# Patient Record
Sex: Female | Born: 1975 | Race: Black or African American | Hispanic: No | Marital: Single | Smoking: Never smoker
Health system: Southern US, Community
[De-identification: ages and names within clinical notes are randomized; demographics above are authoritative.]

## PROBLEM LIST (undated history)

## (undated) DIAGNOSIS — B009 Herpesviral infection, unspecified: Secondary | ICD-10-CM

## (undated) DIAGNOSIS — F419 Anxiety disorder, unspecified: Secondary | ICD-10-CM

## (undated) DIAGNOSIS — A4902 Methicillin resistant Staphylococcus aureus infection, unspecified site: Secondary | ICD-10-CM

## (undated) DIAGNOSIS — I10 Essential (primary) hypertension: Secondary | ICD-10-CM

## (undated) DIAGNOSIS — K219 Gastro-esophageal reflux disease without esophagitis: Secondary | ICD-10-CM

## (undated) DIAGNOSIS — N760 Acute vaginitis: Secondary | ICD-10-CM

## (undated) DIAGNOSIS — Z9889 Other specified postprocedural states: Secondary | ICD-10-CM

## (undated) DIAGNOSIS — T7840XA Allergy, unspecified, initial encounter: Secondary | ICD-10-CM

## (undated) DIAGNOSIS — E785 Hyperlipidemia, unspecified: Secondary | ICD-10-CM

## (undated) DIAGNOSIS — M1990 Unspecified osteoarthritis, unspecified site: Secondary | ICD-10-CM

## (undated) DIAGNOSIS — IMO0002 Reserved for concepts with insufficient information to code with codable children: Secondary | ICD-10-CM

## (undated) HISTORY — DX: Unspecified osteoarthritis, unspecified site: M19.90

## (undated) HISTORY — DX: Gastro-esophageal reflux disease without esophagitis: K21.9

## (undated) HISTORY — DX: Hyperlipidemia, unspecified: E78.5

## (undated) HISTORY — DX: Essential (primary) hypertension: I10

## (undated) HISTORY — DX: Reserved for concepts with insufficient information to code with codable children: IMO0002

## (undated) HISTORY — DX: Acute vaginitis: N76.0

## (undated) HISTORY — DX: Methicillin resistant Staphylococcus aureus infection, unspecified site: A49.02

## (undated) HISTORY — DX: Anxiety disorder, unspecified: F41.9

## (undated) HISTORY — DX: Allergy, unspecified, initial encounter: T78.40XA

## (undated) HISTORY — DX: Other specified postprocedural states: Z98.890

---

## 2000-05-29 ENCOUNTER — Other Ambulatory Visit: Admission: RE | Admit: 2000-05-29 | Discharge: 2000-05-29 | Payer: Self-pay

## 2001-02-05 ENCOUNTER — Emergency Department (HOSPITAL_COMMUNITY): Admission: EM | Admit: 2001-02-05 | Discharge: 2001-02-05 | Payer: Self-pay | Admitting: *Deleted

## 2001-02-07 ENCOUNTER — Emergency Department (HOSPITAL_COMMUNITY): Admission: EM | Admit: 2001-02-07 | Discharge: 2001-02-07 | Payer: Self-pay | Admitting: *Deleted

## 2001-02-13 ENCOUNTER — Emergency Department (HOSPITAL_COMMUNITY): Admission: EM | Admit: 2001-02-13 | Discharge: 2001-02-13 | Payer: Self-pay | Admitting: Emergency Medicine

## 2001-07-23 ENCOUNTER — Other Ambulatory Visit: Admission: RE | Admit: 2001-07-23 | Discharge: 2001-07-23 | Payer: Self-pay

## 2002-05-21 ENCOUNTER — Ambulatory Visit (HOSPITAL_COMMUNITY): Admission: AD | Admit: 2002-05-21 | Discharge: 2002-05-21 | Payer: Self-pay | Admitting: Obstetrics and Gynecology

## 2004-09-16 ENCOUNTER — Ambulatory Visit: Payer: Self-pay | Admitting: Family Medicine

## 2004-09-22 ENCOUNTER — Ambulatory Visit (HOSPITAL_COMMUNITY): Admission: RE | Admit: 2004-09-22 | Discharge: 2004-09-22 | Payer: Self-pay | Admitting: Family Medicine

## 2004-09-22 ENCOUNTER — Ambulatory Visit: Payer: Self-pay | Admitting: Cardiology

## 2004-10-07 ENCOUNTER — Ambulatory Visit: Payer: Self-pay | Admitting: Family Medicine

## 2005-01-06 ENCOUNTER — Ambulatory Visit: Payer: Self-pay | Admitting: Family Medicine

## 2005-03-24 ENCOUNTER — Other Ambulatory Visit: Admission: RE | Admit: 2005-03-24 | Discharge: 2005-03-24 | Payer: Self-pay | Admitting: Family Medicine

## 2005-03-24 ENCOUNTER — Ambulatory Visit: Payer: Self-pay | Admitting: Family Medicine

## 2005-05-16 ENCOUNTER — Ambulatory Visit: Payer: Self-pay | Admitting: Family Medicine

## 2005-05-22 ENCOUNTER — Encounter (HOSPITAL_COMMUNITY): Admission: RE | Admit: 2005-05-22 | Discharge: 2005-06-21 | Payer: Self-pay | Admitting: Family Medicine

## 2005-09-01 ENCOUNTER — Ambulatory Visit: Payer: Self-pay | Admitting: Family Medicine

## 2005-11-28 ENCOUNTER — Ambulatory Visit: Payer: Self-pay | Admitting: Family Medicine

## 2006-02-06 ENCOUNTER — Ambulatory Visit: Payer: Self-pay | Admitting: Family Medicine

## 2006-02-08 ENCOUNTER — Encounter: Payer: Self-pay | Admitting: Family Medicine

## 2006-02-12 ENCOUNTER — Ambulatory Visit (HOSPITAL_COMMUNITY): Admission: RE | Admit: 2006-02-12 | Discharge: 2006-02-12 | Payer: Self-pay | Admitting: Family Medicine

## 2006-04-10 ENCOUNTER — Other Ambulatory Visit: Admission: RE | Admit: 2006-04-10 | Discharge: 2006-04-10 | Payer: Self-pay | Admitting: Family Medicine

## 2006-04-10 ENCOUNTER — Encounter (INDEPENDENT_AMBULATORY_CARE_PROVIDER_SITE_OTHER): Payer: Self-pay | Admitting: Specialist

## 2006-04-10 ENCOUNTER — Ambulatory Visit: Payer: Self-pay | Admitting: Family Medicine

## 2006-04-10 LAB — CONVERTED CEMR LAB: Pap Smear: NORMAL

## 2006-04-11 ENCOUNTER — Encounter: Payer: Self-pay | Admitting: Family Medicine

## 2006-04-11 LAB — CONVERTED CEMR LAB: Candida species: NEGATIVE

## 2006-07-10 ENCOUNTER — Ambulatory Visit: Payer: Self-pay | Admitting: Family Medicine

## 2006-07-11 ENCOUNTER — Encounter: Payer: Self-pay | Admitting: Family Medicine

## 2006-07-11 LAB — CONVERTED CEMR LAB
Candida species: NEGATIVE
GC Probe Amp, Genital: NEGATIVE
Gardnerella vaginalis: NEGATIVE

## 2006-12-19 ENCOUNTER — Encounter: Payer: Self-pay | Admitting: Family Medicine

## 2007-05-20 ENCOUNTER — Encounter: Payer: Self-pay | Admitting: Family Medicine

## 2007-05-20 ENCOUNTER — Ambulatory Visit: Payer: Self-pay | Admitting: Family Medicine

## 2007-05-20 ENCOUNTER — Other Ambulatory Visit: Admission: RE | Admit: 2007-05-20 | Discharge: 2007-05-20 | Payer: Self-pay | Admitting: Family Medicine

## 2007-05-21 ENCOUNTER — Encounter: Payer: Self-pay | Admitting: Family Medicine

## 2007-05-21 LAB — CONVERTED CEMR LAB
GC Probe Amp, Genital: POSITIVE — AB
Trichomonal Vaginitis: NEGATIVE

## 2007-05-22 ENCOUNTER — Ambulatory Visit: Payer: Self-pay | Admitting: Family Medicine

## 2007-05-22 DIAGNOSIS — K649 Unspecified hemorrhoids: Secondary | ICD-10-CM | POA: Insufficient documentation

## 2007-05-22 LAB — CONVERTED CEMR LAB
Eosinophils Absolute: 0.1 10*3/uL (ref 0.0–0.7)
HCT: 40.1 % (ref 36.0–46.0)
MCHC: 31.7 g/dL (ref 30.0–36.0)
MCV: 87 fL (ref 78.0–100.0)
Monocytes Absolute: 0.5 10*3/uL (ref 0.1–1.0)
Monocytes Relative: 6 % (ref 3–12)
Neutrophils Relative %: 73 % (ref 43–77)
Platelets: 313 10*3/uL (ref 150–400)
RBC: 4.61 M/uL (ref 3.87–5.11)
RDW: 13.2 % (ref 11.5–15.5)
Sodium: 140 meq/L (ref 135–145)
WBC: 9.4 10*3/uL (ref 4.0–10.5)

## 2007-05-27 ENCOUNTER — Ambulatory Visit (HOSPITAL_COMMUNITY): Admission: RE | Admit: 2007-05-27 | Discharge: 2007-05-27 | Payer: Self-pay | Admitting: Family Medicine

## 2008-01-10 DIAGNOSIS — A4902 Methicillin resistant Staphylococcus aureus infection, unspecified site: Secondary | ICD-10-CM

## 2008-01-10 HISTORY — DX: Methicillin resistant Staphylococcus aureus infection, unspecified site: A49.02

## 2008-01-27 ENCOUNTER — Ambulatory Visit: Payer: Self-pay | Admitting: Family Medicine

## 2008-01-28 ENCOUNTER — Encounter: Payer: Self-pay | Admitting: Family Medicine

## 2008-01-28 LAB — CONVERTED CEMR LAB: GC Probe Amp, Genital: NEGATIVE

## 2008-01-29 ENCOUNTER — Telehealth: Payer: Self-pay | Admitting: Family Medicine

## 2008-01-29 LAB — CONVERTED CEMR LAB
Gardnerella vaginalis: POSITIVE — AB
Trichomonal Vaginitis: NEGATIVE

## 2008-02-26 ENCOUNTER — Telehealth: Payer: Self-pay | Admitting: Family Medicine

## 2008-03-03 ENCOUNTER — Ambulatory Visit: Payer: Self-pay | Admitting: Family Medicine

## 2008-03-03 LAB — CONVERTED CEMR LAB: Beta hcg, urine, semiquantitative: NEGATIVE

## 2008-03-04 ENCOUNTER — Encounter: Payer: Self-pay | Admitting: Family Medicine

## 2008-03-04 LAB — CONVERTED CEMR LAB: Chlamydia, DNA Probe: NEGATIVE

## 2008-03-05 LAB — CONVERTED CEMR LAB
Candida species: NEGATIVE
Gardnerella vaginalis: POSITIVE — AB
Trichomonal Vaginitis: NEGATIVE

## 2008-05-14 ENCOUNTER — Ambulatory Visit: Payer: Self-pay | Admitting: Family Medicine

## 2008-05-15 ENCOUNTER — Encounter: Payer: Self-pay | Admitting: Family Medicine

## 2008-05-15 LAB — CONVERTED CEMR LAB
Candida species: NEGATIVE
Chlamydia, DNA Probe: NEGATIVE
GC Probe Amp, Genital: NEGATIVE
Gardnerella vaginalis: POSITIVE — AB
Trichomonal Vaginitis: NEGATIVE

## 2008-05-18 ENCOUNTER — Other Ambulatory Visit: Admission: RE | Admit: 2008-05-18 | Discharge: 2008-05-18 | Payer: Self-pay | Admitting: Family Medicine

## 2008-05-18 ENCOUNTER — Encounter: Payer: Self-pay | Admitting: Family Medicine

## 2008-05-18 ENCOUNTER — Ambulatory Visit: Payer: Self-pay | Admitting: Family Medicine

## 2008-05-19 LAB — CONVERTED CEMR LAB
Basophils Relative: 0 % (ref 0–1)
Chloride: 107 meq/L (ref 96–112)
Creatinine, Ser: 0.98 mg/dL (ref 0.40–1.20)
HCT: 40.3 % (ref 36.0–46.0)
HDL: 44 mg/dL (ref >39)
Lymphs Abs: 1.9 10*3/uL (ref 0.7–4.0)
Monocytes Absolute: 0.3 10*3/uL (ref 0.1–1.0)
Monocytes Relative: 4 % (ref 3–12)
Neutrophils Relative %: 68 % (ref 43–77)
Platelets: 304 10*3/uL (ref 150–400)
Potassium: 4.2 meq/L (ref 3.5–5.3)
RBC: 4.7 M/uL (ref 3.87–5.11)
RDW: 12.9 % (ref 11.5–15.5)
Sodium: 140 meq/L (ref 135–145)
Total CHOL/HDL Ratio: 4.3
Triglycerides: 92 mg/dL (ref <150)

## 2008-06-02 ENCOUNTER — Telehealth: Payer: Self-pay | Admitting: Family Medicine

## 2008-06-03 ENCOUNTER — Telehealth: Payer: Self-pay | Admitting: Family Medicine

## 2008-06-04 ENCOUNTER — Ambulatory Visit: Payer: Self-pay | Admitting: Family Medicine

## 2008-06-05 ENCOUNTER — Encounter: Payer: Self-pay | Admitting: Family Medicine

## 2008-06-05 ENCOUNTER — Telehealth: Payer: Self-pay | Admitting: Family Medicine

## 2008-08-24 ENCOUNTER — Telehealth: Payer: Self-pay | Admitting: Family Medicine

## 2008-09-23 ENCOUNTER — Ambulatory Visit: Payer: Self-pay | Admitting: Family Medicine

## 2008-09-23 DIAGNOSIS — I10 Essential (primary) hypertension: Secondary | ICD-10-CM | POA: Insufficient documentation

## 2008-09-25 ENCOUNTER — Telehealth: Payer: Self-pay | Admitting: Family Medicine

## 2008-11-23 ENCOUNTER — Ambulatory Visit: Payer: Self-pay | Admitting: Family Medicine

## 2008-11-23 DIAGNOSIS — E669 Obesity, unspecified: Secondary | ICD-10-CM | POA: Insufficient documentation

## 2008-11-23 DIAGNOSIS — E663 Overweight: Secondary | ICD-10-CM | POA: Insufficient documentation

## 2009-03-15 ENCOUNTER — Ambulatory Visit: Payer: Self-pay | Admitting: Physician Assistant

## 2009-03-18 ENCOUNTER — Ambulatory Visit: Payer: Self-pay | Admitting: Family Medicine

## 2009-06-03 ENCOUNTER — Other Ambulatory Visit: Admission: RE | Admit: 2009-06-03 | Discharge: 2009-06-03 | Payer: Self-pay | Admitting: Family Medicine

## 2009-06-03 ENCOUNTER — Ambulatory Visit: Payer: Self-pay | Admitting: Family Medicine

## 2009-06-03 DIAGNOSIS — K3189 Other diseases of stomach and duodenum: Secondary | ICD-10-CM

## 2009-06-03 DIAGNOSIS — F411 Generalized anxiety disorder: Secondary | ICD-10-CM

## 2009-06-03 DIAGNOSIS — R1013 Epigastric pain: Secondary | ICD-10-CM

## 2009-06-04 ENCOUNTER — Encounter: Payer: Self-pay | Admitting: Family Medicine

## 2009-06-04 LAB — CONVERTED CEMR LAB: Gardnerella vaginalis: NEGATIVE

## 2009-06-14 ENCOUNTER — Encounter: Payer: Self-pay | Admitting: Family Medicine

## 2009-06-17 LAB — CONVERTED CEMR LAB
BUN: 10 mg/dL (ref 6–23)
CO2: 21 meq/L (ref 19–32)
Chloride: 104 meq/L (ref 96–112)
Eosinophils Relative: 1 % (ref 0–5)
HDL: 43 mg/dL (ref >39)
Lymphs Abs: 2.4 10*3/uL (ref 0.7–4.0)
Monocytes Absolute: 0.2 10*3/uL (ref 0.1–1.0)
Monocytes Relative: 3 % (ref 3–12)
Neutro Abs: 5.3 10*3/uL (ref 1.7–7.7)
Sodium: 139 meq/L (ref 135–145)
TSH: 1.617 microintl units/mL (ref 0.350–4.500)
Total CHOL/HDL Ratio: 4.8
Triglycerides: 113 mg/dL (ref <150)
VLDL: 23 mg/dL (ref 0–40)
Vit D, 25-Hydroxy: 40 ng/mL (ref 30–89)
WBC: 8.1 10*3/uL (ref 4.0–10.5)

## 2009-07-09 ENCOUNTER — Ambulatory Visit: Payer: Self-pay | Admitting: Family Medicine

## 2009-07-09 DIAGNOSIS — E785 Hyperlipidemia, unspecified: Secondary | ICD-10-CM | POA: Insufficient documentation

## 2010-02-03 ENCOUNTER — Ambulatory Visit
Admission: RE | Admit: 2010-02-03 | Discharge: 2010-02-03 | Payer: Self-pay | Source: Home / Self Care | Attending: Family Medicine | Admitting: Family Medicine

## 2010-02-03 ENCOUNTER — Encounter: Payer: Self-pay | Admitting: Family Medicine

## 2010-02-03 DIAGNOSIS — G4700 Insomnia, unspecified: Secondary | ICD-10-CM | POA: Insufficient documentation

## 2010-02-06 LAB — CONVERTED CEMR LAB: GC Probe Amp, Genital: NEGATIVE

## 2010-02-08 NOTE — Assessment & Plan Note (Signed)
Summary: BOIL -room 2   Vital Signs:  Patient profile:   35 year old female Menstrual status:  on seasonale Height:      63 inches Weight:      157 pounds BMI:     27.91 O2 Sat:      100 % on Room air Pulse rate:   82 / minute Resp:     16 per minute BP sitting:   180 / 110  (left arm)  Vitals Entered By: Adella Hare LPN (March 15, 1608 10:44 AM)  Serial Vital Signs/Assessments:  Time      Position  BP       Pulse  Resp  Temp     By                     164/110                        Esperanza Sheets PA  CC: boil left upper thigh Is Patient Diabetic? No Pain Assessment Patient in pain? no        CC:  boil left upper thigh.  History of Present Illness: Pt c/o boil on the back of her Lt thigh, near her buttock.  This started 3 days ago.  Is uncertain if has gotten larger.  Is painful.  Noticed today some blood & drainage on her bandage when she changed it.  She had 2 boils last yr in the groin area.  She as seen at urgent care then.  States cx did show MRSA.  She denies fever or chills  Pt also has a hx of htn.  She didn't take her meds for a few mos.  Recently restarted  but hasn't taken this am.  Denies chest pain or palp.   Current Medications (verified): 1)  Seasonale 0.15-0.03 Mg  Tabs (Levonorgest-Eth Estrad 91-Day) .... One Tab By Mouth Once Daily 2)  Fluconazole 100 Mg Tabs (Fluconazole) .... Take 1 Tablet By Mouth Once A Day As Needed 3)  Maxzide-25 37.5-25 Mg Tabs (Triamterene-Hctz) .... Take 1 Tablet By Mouth Once A Day  Allergies (verified): No Known Drug Allergies  Past History:  Past medical history reviewed for relevance to current acute and chronic problems.  Past Medical History: HEMORRHOIDS (ICD-455.6) DYSPAREUNIA (ICD-625.0) VAGINITIS (ICD-616.10)  MRSA  Review of Systems General:  Denies chills and fever. CV:  Denies chest pain or discomfort and palpitations. Resp:  Denies shortness of breath. Heme:  Denies enlarge lymph  nodes.  Physical Exam  General:  Well-developed,well-nourished,in no acute distress; alert,appropriate and cooperative throughout examination Head:  Normocephalic and atraumatic without obvious abnormalities. No apparent alopecia or balding. Lungs:  Normal respiratory effort, chest expands symmetrically. Lungs are clear to auscultation, no crackles or wheezes. Heart:  Normal rate and regular rhythm. S1 and S2 normal without gallop, murmur, click, rub or other extra sounds. Skin:  Lt posterior thigh approx 2.5 cm area of erythema & induration.  Small punctate opening noted, & able to express some pus.  Psych:  Cognition and judgment appear intact. Alert and cooperative with normal attention span and concentration. No apparent delusions, illusions, hallucinations   Impression & Recommendations:  Problem # 1:  ABSCESS (ICD-682.9) Assessment New  Hx of MRSA. (unable to cx today) Discussed MRSA with pt.  Prevention for others, seek treatment at early signs of "boil", etc.  Her updated medication list for this problem includes:    Bactrim Ds 800-160 Mg  Tabs (Sulfamethoxazole-trimethoprim) .Marland Kitchen... 1 bid  Problem # 2:  HYPERTENSION (ICD-401.9) Assessment: Deteriorated  Encourage better med compliance.   Her updated medication list for this problem includes:    Maxzide-25 37.5-25 Mg Tabs (Triamterene-hctz) .Marland Kitchen... Take 1 tablet by mouth once a day  BP today: 180/110 Prior BP: 134/84 (11/23/2008)  Prior 10 Yr Risk Heart Disease: 1 % (11/23/2008)  Labs Reviewed: K+: 4.2 (05/18/2008) Creat: : 0.98 (05/18/2008)   Chol: 187 (05/18/2008)   HDL: 44 (05/18/2008)   LDL: 125 (05/18/2008)   TG: 92 (05/18/2008)  Complete Medication List: 1)  Seasonale 0.15-0.03 Mg Tabs (Levonorgest-eth estrad 91-day) .... One tab by mouth once daily 2)  Fluconazole 100 Mg Tabs (Fluconazole) .... Take 1 tablet by mouth once a day as needed 3)  Maxzide-25 37.5-25 Mg Tabs (Triamterene-hctz) .... Take 1 tablet by mouth  once a day 4)  Bactrim Ds 800-160 Mg Tabs (Sulfamethoxazole-trimethoprim) .Marland Kitchen.. 1 bid  Patient Instructions: 1)  Follow up appt in 2-3 days.  sooner if worsens 2)  I prescribed antibiotics for you. Take as directed. 3)  Take your BP medicine regularly. 4)  Apply heat to the area 3-4 times a day. 5)  Take 650-1000mg  of Tylenol every 4-6 hours as needed for relief of pain or comfort of fever AVOID taking more than 4000mg   in a 24 hour period (can cause liver damage in higher doses). 6)  Take 400-600mg  of Ibuprofen (Advil, Motrin) with food every 4-6 hours as needed for relief of pain or comfort of fever. Prescriptions: BACTRIM DS 800-160 MG TABS (SULFAMETHOXAZOLE-TRIMETHOPRIM) 1 bid  #20 x 0   Entered and Authorized by:   Esperanza Sheets PA   Signed by:   Esperanza Sheets PA on 03/15/2009   Method used:   Electronically to        [facility] [address] [phone]* (retail)       [address] [place]  [address]       Ph: [phone]       Fax: [phone]   RxID:   [id number]

## 2010-02-08 NOTE — Assessment & Plan Note (Signed)
Summary: PHY   Vital Signs:  Patient profile:   35 year old female Menstrual status:  on seasonale Height:      63 inches Weight:      155.25 pounds BMI:     27.60 O2 Sat:      98 % Pulse rate:   87 / minute Pulse rhythm:   regular Resp:     16 per minute BP sitting:   128 / 86  (left arm) Cuff size:   regular  Vitals Entered By: Everitt Amber LPN (Jun 03, 2009 8:20 AM) CC: CPE, wants to get something for anxiety too.   Vision Screening:Left eye w/o correction: 20 / 25 Right Eye w/o correction: 20 / 25 Both eyes w/o correction:  20/ 20  Color vision testing: normal      Vision Entered By: Everitt Amber LPN (Jun 03, 2009 8:20 AM)   CC:  CPE and wants to get something for anxiety too. Marland Kitchen  History of Present Illness: Reports  that she has generally been doing She does have concerns about increased anxiety over thwe past 2 to 3 months with chest tightness and difficulty breathing. She has no new stresses.well. Denies recent fever or chills. Denies sinus pressure, nasal congestion , ear pain or sore throat. Denies chest congestion, or cough productive of sputum. Denies chest pain, palpitations, PND, orthopnea or leg swelling. Denies abdominal pain, nausea, vomitting, diarrhea or constipation. Denies change in bowel movements or bloody stool. Denies dysuria , frequency, incontinence or hesitancy. Denies  joint pain, swelling, or reduced mobility. Denies headaches, vertigo, seizures. Denies depression, or insomnia. Denies  rash, lesions, or itch.     Current Medications (verified): 1)  Seasonale 0.15-0.03 Mg  Tabs (Levonorgest-Eth Estrad 91-Day) .... One Tab By Mouth Once Daily 2)  Maxzide-25 37.5-25 Mg Tabs (Triamterene-Hctz) .... Take 1 Tablet By Mouth Once A Day  Allergies (verified): No Known Drug Allergies  Review of Systems      See HPI General:  Denies chills, fatigue, and fever. Eyes:  Denies blurring and discharge. ENT:  Denies earache, hoarseness, sinus  pressure, and sore throat. CV:  Complains of chest pain or discomfort; 1 2 months h/ointermittent chest pain primarily with anxiety, subasternal, no associated nause , diaphoresis or lightheadedness.Non radiating . Resp:  Denies cough and sputum productive. GI:  Denies abdominal pain, constipation, diarrhea, nausea, and vomiting. GU:  Denies dysuria and urinary frequency. MS:  Denies joint pain, low back pain, mid back pain, and thoracic pain. Derm:  Denies itching, lesion(s), and rash. Neuro:  Denies headaches, poor balance, seizures, and sensation of room spinning. Psych:  Complains of anxiety and irritability; denies depression, easily angered, easily tearful, suicidal thoughts/plans, thoughts of violence, and unusual visions or sounds; 2 mnth h/o increased irritability. Endo:  Denies cold intolerance, excessive hunger, excessive thirst, excessive urination, heat intolerance, polyuria, and weight change. Heme:  Denies abnormal bruising and bleeding. Allergy:  Denies hives or rash and itching eyes.  Physical Exam  General:  Well-developed,well-nourished,in no acute distress; alert,appropriate and cooperative throughout examination Head:  Normocephalic and atraumatic without obvious abnormalities. No apparent alopecia or balding. Eyes:  No corneal or conjunctival inflammation noted. EOMI. Perrla. Funduscopic exam benign, without hemorrhages, exudates or papilledema. Vision grossly normal. Ears:  External ear exam shows no significant lesions or deformities.  Otoscopic examination reveals clear canals, tympanic membranes are intact bilaterally without bulging, retraction, inflammation or discharge. Hearing is grossly normal bilaterally. Nose:  External nasal examination shows no deformity or  inflammation. Nasal mucosa are pink and moist without lesions or exudates. Mouth:  Oral mucosa and oropharynx without lesions or exudates.  Teeth in good repair. Neck:  No deformities, masses, or tenderness  noted. Chest Wall:  No deformities, masses, or tenderness noted. Breasts:  No mass, nodules, thickening, tenderness, bulging, retraction, inflamation, nipple discharge or skin changes noted.   Lungs:  Normal respiratory effort, chest expands symmetrically. Lungs are clear to auscultation, no crackles or wheezes. Heart:  Normal rate and regular rhythm. S1 and S2 normal without gallop, murmur, click, rub or other extra sounds. Abdomen:  Bowel sounds positive,abdomen soft and non-tender without masses, organomegaly or hernias noted. Genitalia:  Normal introitus for age, no external lesions, no vaginal discharge, mucosa pink and moist, no vaginal or cervical lesions, no vaginal atrophy, no friaility or hemorrhage, normal uterus size and position, no adnexal masses or tenderness Msk:  No deformity or scoliosis noted of thoracic or lumbar spine.   Pulses:  R and L carotid,radial,femoral,dorsalis pedis and posterior tibial pulses are full and equal bilaterally Extremities:  No clubbing, cyanosis, edema, or deformity noted with normal full range of motion of all joints.   Neurologic:  No cranial nerve deficits noted. Station and gait are normal. Plantar reflexes are down-going bilaterally. DTRs are symmetrical throughout. Sensory, motor and coordinative functions appear intact. Skin:  Intact without suspicious lesions or rashes Cervical Nodes:  No lymphadenopathy noted Axillary Nodes:  No palpable lymphadenopathy Inguinal Nodes:  No significant adenopathy Psych:  Cognition and judgment appear intact. Alert and cooperative with normal attention span and concentration. No apparent delusions, illusions, hallucinations   Impression & Recommendations:  Problem # 1:  CHEST PAIN UNSPECIFIED (ICD-786.50) Assessment Comment Only  Orders: EKG w/ Interpretation (93000)nSR , no ischemia  Problem # 2:  HYPERTENSION (ICD-401.9) Assessment: Unchanged  Her updated medication list for this problem includes:     Maxzide-25 37.5-25 Mg Tabs (Triamterene-hctz) .Marland Kitchen... Take 1 tablet by mouth once a day  Orders: T-Basic Metabolic Panel [phone])  BP today: 128/86 Prior BP: 130/80 (03/18/2009)  Prior 10 Yr Risk Heart Disease: 1 % (11/23/2008)  Labs Reviewed: K+: 4.2 (05/18/2008) Creat: : 0.98 (05/18/2008)   Chol: 187 (05/18/2008)   HDL: 44 (05/18/2008)   LDL: 125 (05/18/2008)   TG: 92 (05/18/2008)  Problem # 3:  ANXIETY STATE, UNSPECIFIED (ICD-300.00) Assessment: Deteriorated  Her updated medication list for this problem includes:    Paroxetine Hcl 10 Mg Tabs (Paroxetine hcl) .Marland Kitchen... Take 1 tablet by mouth once a day  Problem # 4:  OVERWEIGHT (ICD-278.02) Assessment: Unchanged  Ht: 63 (06/03/2009)   Wt: 155.25 (06/03/2009)   BMI: 27.60 (06/03/2009)regular exercise and reduction in caloricintake discussed and encouraged  Complete Medication List: 1)  Seasonale 0.15-0.03 Mg Tabs (Levonorgest-eth estrad 91-day) .... One tab by mouth once daily 2)  Maxzide-25 37.5-25 Mg Tabs (Triamterene-hctz) .... Take 1 tablet by mouth once a day 3)  Paroxetine Hcl 10 Mg Tabs (Paroxetine hcl) .... Take 1 tablet by mouth once a day  Other Orders: T-Lipid Profile (08657-84696) T-TSH [id number]) T-CBC w/Diff (40102-72536) TLB-H. Pylori Abs(Helicobacter Pylori) (86677-HELICO) T-Vitamin D (25-Hydroxy) (64403-47425) T-Wet Prep by Molecular Probe [phone]) T-Chlamydia & GC Probe, Genital ([id number]) Pap Smear (32951)  Patient Instructions: 1)  F/U in 6 weeks. 2)  It is important that you exercise regularly at least 20 minutes 5 times a week. If you develop chest pain, have severe difficulty breathing, or feel very tired , stop exercising immediately and seek medical attention. 3)  You need to lose weight. Consider a lower calorie diet and regular exercise.  4)  BMP prior to visit, ICD-9: 5)  Lipid Panel prior to visit, ICD-9:  fastinf asap 6)  TSH prior to visit, ICD-9: 7)  CBC w/ Diff prior to  visit, ICD-9: 8)  H pylori and Vit D 9)  I believe that your chest pain is from anxiety attacks, we will get aN EKG to ensure your heart is  not in trouble. and i am sending in med for anxiety  Prescriptions: PAROXETINE HCL 10 MG TABS (PAROXETINE HCL) Take 1 tablet by mouth once a day  #30 x 3   Entered and Authorized by:   Syliva Overman MD   Signed by:   Syliva Overman MD on 06/03/2009   Method used:   Electronically to        [facility]  [address] (retail)       [address]       [place], [place]  [address]       Ph: [phone]       Fax: [phone]   RxID:   [id number]

## 2010-02-08 NOTE — Assessment & Plan Note (Signed)
Summary: follow up bp and boil - room 3   Vital Signs:  Patient profile:   35 year old female Menstrual status:  on seasonale Height:      63 inches Weight:      153.25 pounds BMI:     27.25 O2 Sat:      96 % on Room air Pulse rate:   95 / minute Resp:     16 per minute BP sitting:   130 / 80  (left arm)  Vitals Entered By: Adella Hare LPN (March 18, 2009 9:01 AM) CC: follow up blood pressure and boil Is Patient Diabetic? No Pain Assessment Patient in pain? no        CC:  follow up blood pressure and boil.  History of Present Illness: Pt is here today to f/u on the abscess on her Lt thigh.  Pt states that it has gotten much better.  Is smaller & not painful now.  No drainage now either.  She is taking her antibiotics.  No probs with.  Pt also needed her BP rechecked today.  She is taking her medication regularly now & did take it this am.  She states she did check her BP at the pharmacy once since her last visit here & it was much better.  She doesn't remember the reading.    Current Medications (verified): 1)  Seasonale 0.15-0.03 Mg  Tabs (Levonorgest-Eth Estrad 91-Day) .... One Tab By Mouth Once Daily 2)  Fluconazole 100 Mg Tabs (Fluconazole) .... Take 1 Tablet By Mouth Once A Day As Needed 3)  Maxzide-25 37.5-25 Mg Tabs (Triamterene-Hctz) .... Take 1 Tablet By Mouth Once A Day 4)  Bactrim Ds 800-160 Mg Tabs (Sulfamethoxazole-Trimethoprim) .Marland Kitchen.. 1 Bid  Allergies (verified): No Known Drug Allergies  Review of Systems General:  Denies chills and fever.  Physical Exam  General:  Well-developed,well-nourished,in no acute distress; alert,appropriate and cooperative throughout examination Head:  Normocephalic and atraumatic without obvious abnormalities. No apparent alopecia or balding. Skin:  Abscess is significantly improved.  Mild erythema approx 1 cm diameter, no opening or drainage.  No palp induration or nodule. Psych:  Cognition and judgment appear intact. Alert  and cooperative with normal attention span and concentration. No apparent delusions, illusions, hallucinations   Impression & Recommendations:  Problem # 1:  ABSCESS (ICD-682.9) Assessment Improved Complete antibiotics as prescribed.  Her updated medication list for this problem includes:    Bactrim Ds 800-160 Mg Tabs (Sulfamethoxazole-trimethoprim) .Marland Kitchen... 1 bid  Problem # 2:  HYPERTENSION (ICD-401.9) Assessment: Improved Continue medication daily.  Her updated medication list for this problem includes:    Maxzide-25 37.5-25 Mg Tabs (Triamterene-hctz) .Marland Kitchen... Take 1 tablet by mouth once a day  Complete Medication List: 1)  Seasonale 0.15-0.03 Mg Tabs (Levonorgest-eth estrad 91-day) .... One tab by mouth once daily 2)  Fluconazole 100 Mg Tabs (Fluconazole) .... Take 1 tablet by mouth once a day as needed 3)  Maxzide-25 37.5-25 Mg Tabs (Triamterene-hctz) .... Take 1 tablet by mouth once a day 4)  Bactrim Ds 800-160 Mg Tabs (Sulfamethoxazole-trimethoprim) .Marland Kitchen.. 1 bid  Patient Instructions: 1)  Please schedule a follow-up appointment in 2 months for physical/pap. 2)  Continue your blood pressure medication & make sure you take it every day. 3)  If your boil worsens, or new one develops please have it checked & don't wait.

## 2010-02-08 NOTE — Assessment & Plan Note (Signed)
Summary: F UP   Vital Signs:  Patient profile:   35 year old female Menstrual status:  on seasonale Height:      63 inches Weight:      156 pounds BMI:     27.73 O2 Sat:      98 % Pulse rate:   72 / minute Pulse rhythm:   regular Resp:     16 per minute BP sitting:   122 / 82  (left arm) Cuff size:   regular  Vitals Entered By: Everitt Amber LPN (July 09, 4096 8:05 AM)  Nutrition Counseling: Patient's BMI is greater than 25 and therefore counseled on weight management options. CC: was put on axiety med last time but its not helping, wants to know if she can try the lowest dose of xanax   CC:  was put on axiety med last time but its not helping and wants to know if she can try the lowest dose of xanax.  History of Present Illness: Reports  that she is doing fairly well. She is here primarilily to f/u her res[ponse to anxiolytic med as well as obesity. She denies any response to paxilo, denies depression, states she just gets irritated very easily.The root of the problem stems seemingly from a broken relationship several months ago, she just recently is having a chance to look at it again now that she completed school. Denies recent fever or chills. Denies sinus pressure, nasal congestion , ear pain or sore throat. Denies chest congestion, or cough productive of sputum. Denies chest pain, palpitations, PND, orthopnea or leg swelling. Denies abdominal pain, nausea, vomitting, diarrhea or constipation. Denies change in bowel movements or bloody stool. Denies dysuria , frequency, incontinence or hesitancy. Denies  joint pain, swelling, or reduced mobility. Denies headaches, vertigo, seizures. Denies depressio Denies  rash, lesions, or itch.     Current Medications (verified): 1)  Seasonale 0.15-0.03 Mg  Tabs (Levonorgest-Eth Estrad 91-Day) .... One Tab By Mouth Once Daily 2)  Maxzide-25 37.5-25 Mg Tabs (Triamterene-Hctz) .... Take 1 Tablet By Mouth Once A Day 3)  Paroxetine Hcl  10 Mg Tabs (Paroxetine Hcl) .... Take 1 Tablet By Mouth Once A Day  Allergies (verified): No Known Drug Allergies  Review of Systems      See HPI Eyes:  Denies blurring and discharge. Endo:  Denies cold intolerance, excessive hunger, excessive thirst, excessive urination, heat intolerance, polyuria, and weight change. Heme:  Denies abnormal bruising and bleeding. Allergy:  Complains of seasonal allergies; denies hives or rash and itching eyes.  Physical Exam  General:  Well-developed,well-nourished,in no acute distress; alert,appropriate and cooperative throughout examination HEENT: No facial asymmetry,  EOMI, No sinus tenderness, TM's Clear, oropharynx  pink and moist.   Chest: Clear to auscultation bilaterally.  CVS: S1, S2, No murmurs, No S3.   Abd: Soft, Nontender.  MS: Adequate ROM spine, hips, shoulders and knees.  Ext: No edema.   CNS: CN 2-12 intact, power tone and sensation normal throughout.   Skin: Intact, no visible lesions or rashes.  Psych: Good eye contact, normal affect.  Memory intact, not anxious or depressed appearing.    Impression & Recommendations:  Problem # 1:  ANXIETY STATE, UNSPECIFIED (ICD-300.00) Assessment Unchanged  The following medications were removed from the medication list:    Paroxetine Hcl 10 Mg Tabs (Paroxetine hcl) .Marland Kitchen... Take 1 tablet by mouth once a day Her updated medication list for this problem includes:    Alprazolam 0.25 Mg Tabs (Alprazolam) .Marland KitchenMarland KitchenMarland KitchenMarland Kitchen  One tablet once daily as needed for severe anxiety , max is 8 tablets per month, after extensive interview, pt does not require counselling at this time, she hs a good support sytem  Problem # 2:  OVERWEIGHT (ICD-278.02) Assessment: Unchanged  Ht: 63 (07/09/2009)   Wt: 156 (07/09/2009)   BMI: 27.73 (07/09/2009), pt does not feel she relly needs to lose weight, however she is willing to commit to regular exercise  Problem # 3:  HYPERTENSION (ICD-401.9) Assessment: Unchanged  Her  updated medication list for this problem includes:    Maxzide-25 37.5-25 Mg Tabs (Triamterene-hctz) .Marland Kitchen... Take 1 tablet by mouth once a day  BP today: 122/82 Prior BP: 128/86 (06/03/2009)  Prior 10 Yr Risk Heart Disease: 1 % (11/23/2008)  Labs Reviewed: K+: 3.6 (06/04/2009) Creat: : 0.98 (06/04/2009)   Chol: 207 (06/04/2009)   HDL: 43 (06/04/2009)   LDL: 141 (06/04/2009)   TG: 113 (06/04/2009)  Problem # 4:  HYPERLIPIDEMIA (ICD-272.4) Assessment: Comment Only  Prior 10 Yr Risk Heart Disease: 1 % (11/23/2008)   HDL:43 (06/04/2009), 44 (05/18/2008)  LDL:141 (06/04/2009), 125 (05/18/2008)  Chol:207 (06/04/2009), 187 (05/18/2008)  Trig:113 (06/04/2009), 92 (05/18/2008) counselled re low fat diet  Complete Medication List: 1)  Seasonale 0.15-0.03 Mg Tabs (Levonorgest-eth estrad 91-day) .... One tab by mouth once daily 2)  Maxzide-25 37.5-25 Mg Tabs (Triamterene-hctz) .... Take 1 tablet by mouth once a day 3)  Alprazolam 0.25 Mg Tabs (Alprazolam) .... One tablet once daily as needed for severe anxiety , max is 8 tablets per month  Patient Instructions: 1)  Please schedule a follow-up appointment in 4 months. 2)  It is important that you exercise regularly at least 20 minutes 5 times a week. If you develop chest pain, have severe difficulty breathing, or feel very tired , stop exercising immediately and seek medical attention. 3)  You need to lose weight. Consider a lower calorie diet and regular exercise.  4)  Pls follow a low fat diet. 5)  New med for judicious use for anxiety, however regular exercise will go a long way Prescriptions: ALPRAZOLAM 0.25 MG TABS (ALPRAZOLAM) one tablet once daily as needed for severe anxiety , max is 8 tablets per month  #8 x 3   Entered and Authorized by:   Syliva Overman MD   Signed by:   Syliva Overman MD on 07/09/2009   Method used:   Printed then faxed to ...       [facility] [address] [phone]* (retail)       [address] [place]  [address]       Ph: [phone]       Fax: [phone]   RxID:   [id number]

## 2010-02-08 NOTE — Letter (Signed)
Summary: Letter  Letter   Imported By: Lind Guest 06/14/2009 12:58:51  _____________________________________________________________________  External Attachment:    Type:   Image     Comment:   External Document

## 2010-02-10 NOTE — Letter (Signed)
Summary: DOSE INCREASE  DOSE INCREASE   Imported By: Lind Guest 02/03/2010 13:38:36  _____________________________________________________________________  External Attachment:    Type:   Image     Comment:   External Document

## 2010-02-16 DIAGNOSIS — J209 Acute bronchitis, unspecified: Secondary | ICD-10-CM | POA: Insufficient documentation

## 2010-02-16 DIAGNOSIS — J019 Acute sinusitis, unspecified: Secondary | ICD-10-CM | POA: Insufficient documentation

## 2010-02-24 NOTE — Assessment & Plan Note (Signed)
Summary: NOT SLEEPING AND STOMACH   Vital Signs:  Patient profile:   35 year old female Menstrual status:  on seasonale Height:      63 inches Weight:      162.50 pounds BMI:     28.89 O2 Sat:      98 % on Room air Pulse rate:   91 / minute Pulse rhythm:   regular Resp:     16 per minute BP sitting:   150 / 90  (left arm)  Vitals Entered By: Adella Hare LPN (February 03, 2010 9:33 AM)  Nutrition Counseling: Patient's BMI is greater than 25 and therefore counseled on weight management options.  O2 Flow:  Room air CC: insomnia, weak stomach, anxiety Is Patient Diabetic? No Comments symptoms have increased since death of son's father this month   CC:  insomnia, weak stomach, and anxiety.  History of Present Illness: pt in today stating she has not been doing at all well since the unexpected death of her son's father. She is having increased anxiety and depression with poor sleep. She is not suicidal or homicidal, just reports feeling lost and alone, however says that she has good family support and declines therapy. She has also recent ly developed head and chest congestion.  Current Medications (verified): 1)  Seasonale 0.15-0.03 Mg  Tabs (Levonorgest-Eth Estrad 91-Day) .... One Tab By Mouth Once Daily 2)  Maxzide-25 37.5-25 Mg Tabs (Triamterene-Hctz) .... Take 1 Tablet By Mouth Once A Day 3)  Alprazolam 0.25 Mg Tabs (Alprazolam) .... One Tablet Once Daily As Needed For Severe Anxiety , Max Is 8 Tablets Per Month  Allergies (verified): No Known Drug Allergies  Review of Systems      See HPI General:  Complains of malaise and sleep disorder. Eyes:  Denies discharge and double vision. ENT:  Complains of hoarseness, nasal congestion, postnasal drainage, sinus pressure, and sore throat; 4 day huistory. CV:  Denies chest pain or discomfort, palpitations, and swelling of feet. Resp:  Complains of coughing up blood and sputum productive; 2 day history. GI:  Complains of loss of  appetite and nausea; denies constipation and diarrhea. GU:  Denies dysuria and urinary frequency. MS:  Denies joint pain, joint redness, mid back pain, and muscle aches. Neuro:  Complains of headaches; occasional, primarily due to stress and sleep deprivation. Psych:  Complains of anxiety and depression. Endo:  Denies cold intolerance, excessive hunger, excessive thirst, excessive urination, and heat intolerance. Heme:  Denies abnormal bruising and bleeding. Allergy:  Denies hives or rash and itching eyes.  Physical Exam  General:  Well-developed,well-nourished,in no acute distress; alert,appropriate and cooperative throughout examination HEENT: No facial asymmetry,  EOMI, No sinus tenderness, TM's Clear, oropharynx  pink and moist.   Chest: Clear to auscultation bilaterally.  CVS: S1, S2, No murmurs, No S3.   Abd: Soft, Nontender.  MS: Adequate ROM spine, hips, shoulders and knees.  Ext: No edema.   CNS: CN 2-12 intact, power tone and sensation normal throughout.   Skin: Intact, no visible lesions or rashes.  Psych: Good eye contact, flataffect.  Memory intact, tearful, t anxious and  depressed appearing.    Impression & Recommendations:  Problem # 1:  INSOMNIA (ICD-780.52) Assessment Deteriorated  Her updated medication list for this problem includes:    Restoril 15 Mg Caps (Temazepam) .Marland Kitchen... Take 1 capsule by mouth at bedtime as needed for insomnia/axiety  Discussed sleep hygiene.   Problem # 2:  ANXIETY STATE, UNSPECIFIED (ICD-300.00) Assessment: Deteriorated  The following medications were removed from the medication list:    Alprazolam 0.25 Mg Tabs (Alprazolam) ..... One tablet once daily as needed for severe anxiety , max is 8 tablets per month    Paroxetine Hcl 10 Mg Tabs (Paroxetine hcl) ..... One tab by mouth once daily  Discussed medication use and relaxation techniques.   Problem # 3:  HYPERTENSION (ICD-401.9) Assessment: Deteriorated  The following  medications were removed from the medication list:    Maxzide-25 37.5-25 Mg Tabs (Triamterene-hctz) .Marland Kitchen... Take 1 tablet by mouth once a day Her updated medication list for this problem includes:    Triamterene-hctz 37.5-25 Mg Caps (Triamterene-hctz) ..... One and a half tablets once daily, effective 02/03/2010  BP today: 150/90 Prior BP: 122/82 (07/09/2009)  Prior 10 Yr Risk Heart Disease: 1 % (11/23/2008)  Labs Reviewed: K+: 3.6 (06/04/2009) Creat: : 0.98 (06/04/2009)   Chol: 207 (06/04/2009)   HDL: 43 (06/04/2009)   LDL: 141 (06/04/2009)   TG: 113 (06/04/2009)  Problem # 4:  OVERWEIGHT (ICD-278.02) Assessment: Deteriorated  Ht: 63 (02/03/2010)   Wt: 162.50 (02/03/2010)   BMI: 28.89 (02/03/2010) therapeutic lifestyle change discussed and encouraged  Problem # 5:  ACUTE BRONCHITIS (ICD-466.0) Assessment: Comment Only  Her updated medication list for this problem includes:    Septra Ds 800-160 Mg Tabs (Sulfamethoxazole-trimethoprim) .Marland Kitchen... Take 1 tablet by mouth two times a day    Tessalon Perles 100 Mg Caps (Benzonatate) .Marland Kitchen... Take 1 capsule by mouth three times a day  Take antibiotics and other medications as directed. Encouraged to push clear liquids, get enough rest, and take acetaminophen as needed. To be seen in 5-7 days if no improvement, sooner if worse.  Problem # 6:  ACUTE SINUSITIS, UNSPECIFIED (ICD-461.9) Assessment: Comment Only  Her updated medication list for this problem includes:    Septra Ds 800-160 Mg Tabs (Sulfamethoxazole-trimethoprim) .Marland Kitchen... Take 1 tablet by mouth two times a day    Tessalon Perles 100 Mg Caps (Benzonatate) .Marland Kitchen... Take 1 capsule by mouth three times a day  Instructed on treatment. Call if symptoms persist or worsen.   Complete Medication List: 1)  Seasonale 0.15-0.03 Mg Tabs (Levonorgest-eth estrad 91-day) .... One tab by mouth once daily 2)  Septra Ds 800-160 Mg Tabs (Sulfamethoxazole-trimethoprim) .... Take 1 tablet by mouth two times a  day 3)  Tessalon Perles 100 Mg Caps (Benzonatate) .... Take 1 capsule by mouth three times a day 4)  Restoril 15 Mg Caps (Temazepam) .... Take 1 capsule by mouth at bedtime as needed for insomnia/axiety 5)  Fluconazole 150 Mg Tabs (Fluconazole) .... Take 1 tablet by mouth once a day as needed for vag itching 6)  Triamterene-hctz 37.5-25 Mg Caps (Triamterene-hctz) .... One and a half tablets once daily, effective 02/03/2010  Patient Instructions: 1)  f/u in 6 weeks 2)  It is important that you exercise regularly at least 20 minutes 5 times a week. If you develop chest pain, have severe difficulty breathing, or feel very tired , stop exercising immediately and seek medical attention. 3)  You need to lose weight. Consider a lower calorie diet and regular exercise.  4)  you are being treated for sinusitis, broonchitis and laryngitis, meds are sent in. 5)  Neew med fo sleep and you will get printed info sleep hygiene 6)  Your BP is hiogh, DOSE inc on the med to oNE and a half once daily Prescriptions: TRIAMTERENE-HCTZ 37.5-25 MG CAPS (TRIAMTERENE-HCTZ) one and a half tablets once daily, effective 02/03/2010  #  45 x 3   Entered and Authorized by:   Syliva Overman MD   Signed by:   Syliva Overman MD on 02/03/2010   Method used:   Electronically to        [facility] [address] [phone]* (retail)       [address] [place], [place]  [address]       Ph: [phone]       Fax: [phone]   RxID:   [id number] FLUCONAZOLE 150 MG TABS (FLUCONAZOLE) Take 1 tablet by mouth once a day as needed for vag itching  #3 x 0   Entered and Authorized by:   Syliva Overman MD   Signed by:   Syliva Overman MD on 02/03/2010   Method used:   Electronically to        [facility] [address] [phone]* (retail)       [address] [place], [place]  [address]       Ph: [phone]       Fax: [phone]   RxID:   [id number] RESTORIL 15 MG CAPS (TEMAZEPAM) Take 1 capsule by mouth at bedtime as  needed for insomnia/axiety  #30 x 1   Entered and Authorized by:   Syliva Overman MD   Signed by:   Syliva Overman MD on 02/03/2010   Method used:   Printed then faxed to ...       [facility] [address] [phone]* (retail)       [address] [place], [place]  [address]       Ph: [phone]       Fax: [phone]   RxID:   [id number] TESSALON PERLES 100 MG CAPS (BENZONATATE) Take 1 capsule by mouth three times a day  #30 x 0   Entered and Authorized by:   Syliva Overman MD   Signed by:   Syliva Overman MD on 02/03/2010   Method used:   Electronically to        [facility] [address] [phone]* (retail)       [address] [place], [place]  [address]       Ph: [phone]       Fax: [phone]   RxID:   [id number] SEPTRA DS 800-160 MG TABS (SULFAMETHOXAZOLE-TRIMETHOPRIM) Take 1 tablet by mouth two times a day  #20 x 0   Entered and Authorized by:   Syliva Overman MD   Signed by:   Syliva Overman MD on 02/03/2010   Method used:   Electronically to        [facility] [address] [phone]* (retail)       [address] [place], [place]  [address]       Ph: [phone]       Fax: [phone]   RxID:   [id number]    Orders Added: 1)  Est. Patient Level IV [39767]

## 2010-03-28 ENCOUNTER — Encounter: Payer: Self-pay | Admitting: Family Medicine

## 2010-03-28 ENCOUNTER — Ambulatory Visit: Payer: Self-pay | Admitting: Family Medicine

## 2010-05-27 NOTE — H&P (Signed)
   NAME:  Catherine Allen, Catherine Allen                        ACCOUNT NO.:  [id number]   MEDICAL RECORD NO.:  [id number]                   PATIENT TYPE:  OBV   LOCATION:  A414                                 FACILITY:  [facility]   PHYSICIAN:  Tilda Burrow, M.D.              DATE OF BIRTH:  09/12/1975   DATE OF ADMISSION:  05/21/2002  DATE OF DISCHARGE:                                HISTORY & PHYSICAL   TRANSPORT NOTE:   ADMISSION DIAGNOSIS:  Pregnancy at 24 weeks and 5-6 days with hourglassing  membranes and prolapsing cord down into the membranes.   Upon awakening this morning, the patient was having a pressure feeling.  Later on in the morning, she started cramping and was seen in the office.  She was noted to have dilatation and hourglassing membranes with cord noted  inside the membranes.  The patient was transferred over to the hospital and  at that point stabilized.  Due to the fact that she had hourglassing  membranes with a cord into the hourglassing membrane, it was felt needed  that she have a clinician do her transport.  During transport to [facility], her vital signs remained stable.  She was in  Trendelenburg position.  She had magnesium sulfate going at 3 g/hr and  lactated Ringers at 100.  She had a Foley catheter in.  It was intact and  draining clear yellow urine.  Good output was noted.  The fetal heart tones  during transport were in the 150s-160s and stable with no decelerations  noted with auscultation and Doppler.  Transport was uneventful.  The patient  arrived at [facility].  The report was given to the labor  and delivery nurses at [facility] and patient care was  turned over at that time.  The estimated length of transport time was  approximately three hours.     Zerita Boers, Reita Cliche, M.D.    DL/MEDQ  D:  16/10/9602  T:  05/21/2002  Job:  [id number]   cc:   [facility]

## 2010-05-27 NOTE — Procedures (Signed)
NAMEICEIS, KNAB NO.:  [id number]   MEDICAL RECORD NO.:  [id number]          PATIENT TYPE:  OUT   LOCATION:  RAD                           FACILITY:  [facility]   PHYSICIAN:  Hayden Bing, M.D. [facility] OF BIRTH:  [birth year]   DATE OF PROCEDURE:  09/22/2004  DATE OF DISCHARGE:                                  ECHOCARDIOGRAM   REFERRING PHYSICIAN:  Dorthula Rue. Early Chars, M.D.   CLINICAL DATA:  A 36 year old woman with a murmur.   M-MODE TRACINGS:  Aorta 2.4, left atrium 3.4, septum 1.0, posterior wall  1.0, LV diastole 4.3, LV systole 2.6.   RESULTS:  1.  Technically adequate echocardiographic study.  2.  Normal left atrium, right atrium and right ventricle.  3.  Normal aortic, mitral, tricuspid and pulmonic valves.  4.  Normal proximal pulmonary artery.  5.  Normal internal dimension, wall thickness, regional and global function      of the left ventricle.  6.  Normal IVC.  7.  No pericardial effusion.  8.  Normal Doppler study with physiologic tricuspid regurgitation and normal      estimated RV systolic pressure.      Steele Bing, M.D. [facility]  Electronically Signed     RR/MEDQ  D:  09/22/2004  T:  09/23/2004  Job:  [id number]

## 2010-06-10 DIAGNOSIS — Z9889 Other specified postprocedural states: Secondary | ICD-10-CM

## 2010-06-10 HISTORY — DX: Other specified postprocedural states: Z98.890

## 2010-06-15 ENCOUNTER — Encounter: Payer: Self-pay | Admitting: Family Medicine

## 2010-06-16 ENCOUNTER — Ambulatory Visit (INDEPENDENT_AMBULATORY_CARE_PROVIDER_SITE_OTHER): Payer: Medicaid Other | Admitting: Family Medicine

## 2010-06-16 ENCOUNTER — Encounter: Payer: Self-pay | Admitting: Family Medicine

## 2010-06-16 ENCOUNTER — Other Ambulatory Visit (HOSPITAL_COMMUNITY)
Admission: RE | Admit: 2010-06-16 | Discharge: 2010-06-16 | Disposition: A | Discharge status: Home / Self Care | Payer: Medicaid Other | Source: Ambulatory Visit | Attending: Family Medicine | Admitting: Family Medicine

## 2010-06-16 VITALS — BP 128/88 | HR 92 | Resp 16 | Ht 63.5 in | Wt 163.1 lb

## 2010-06-16 DIAGNOSIS — Z0000 Encounter for general adult medical examination without abnormal findings: Secondary | ICD-10-CM

## 2010-06-16 DIAGNOSIS — E785 Hyperlipidemia, unspecified: Secondary | ICD-10-CM

## 2010-06-16 DIAGNOSIS — I10 Essential (primary) hypertension: Secondary | ICD-10-CM

## 2010-06-16 DIAGNOSIS — N760 Acute vaginitis: Secondary | ICD-10-CM | POA: Insufficient documentation

## 2010-06-16 DIAGNOSIS — R1110 Vomiting, unspecified: Secondary | ICD-10-CM | POA: Insufficient documentation

## 2010-06-16 DIAGNOSIS — F411 Generalized anxiety disorder: Secondary | ICD-10-CM

## 2010-06-16 DIAGNOSIS — Z01419 Encounter for gynecological examination (general) (routine) without abnormal findings: Secondary | ICD-10-CM | POA: Insufficient documentation

## 2010-06-16 DIAGNOSIS — K3189 Other diseases of stomach and duodenum: Secondary | ICD-10-CM

## 2010-06-16 DIAGNOSIS — R5383 Other fatigue: Secondary | ICD-10-CM

## 2010-06-16 DIAGNOSIS — G4700 Insomnia, unspecified: Secondary | ICD-10-CM

## 2010-06-16 DIAGNOSIS — R5381 Other malaise: Secondary | ICD-10-CM

## 2010-06-16 DIAGNOSIS — E663 Overweight: Secondary | ICD-10-CM

## 2010-06-16 DIAGNOSIS — Z124 Encounter for screening for malignant neoplasm of cervix: Secondary | ICD-10-CM

## 2010-06-16 MED ORDER — TRIAMTERENE-HCTZ 37.5-25 MG PO TABS: ORAL_TABLET | ORAL | Status: DC

## 2010-06-16 NOTE — Patient Instructions (Signed)
F/u in 4 months.  Fasting labs asap.  You are being referred to gynaecology, GI and for counselling, pls keep appts  No med changes.  It is important that you exercise regularly at least 30 minutes 5 times a week. If you develop chest pain, have severe difficulty breathing, or feel very tired, stop exercising immediately and seek medical attention  A healthy diet is rich in fruit, vegetables and whole grains. Poultry fish, nuts and beans are a healthy choice for protein rather then red meat. A low sodium diet and drinking 64 ounces of water daily is generally recommended. Oils and sweet should be limited. Carbohydrates especially for those who are diabetic or overweight, should be limited to 34-45 gram per meal. It is important to eat on a regular schedule, at least 3 times daily. Snacks should be primarily fruits, vegetables or nuts.

## 2010-06-16 NOTE — Progress Notes (Signed)
  Subjective:    [name], female    DOB: 1975-06-30, 35 y.o.   MRN: [id number]  HPI Recurrent vomiting and gagging, progressively worsening in the past 3 to 6 months, states she has always been someone with a weak stomach.Denies abdominal pain, nausea, bloating and belching. She had BV dx this past Friday at urgent care, the first for the yr, she was advised to have gynae eval since it had been recurrent, I will be more than happy to refer. The PT is here for annual exam and re-evaluation of chronic medical conditions, medication management and review of recent lab and radiology data.  Preventive health is updated, specifically  Cancer screening, and Immunization.   Questions or concerns regarding consultations or procedures which the PT has had in the interim are  addressed. The PT denies any adverse reactions to current medications since the last visit.      Review of Systems Denies recent fever or chills. Denies sinus pressure, nasal congestion, ear pain or sore throat. Denies chest congestion, productive cough or wheezing. Denies chest pains, palpitations, paroxysmal nocturnal dyspnea, orthopnea and leg swelling Denies abdominal pain, diarrhea or constipation.  Denies rectal bleeding or change in bowel movement. Denies dysuria, frequency, hesitancy or incontinence. Denies joint pain, swelling and limitation in mobility. Denies headaches, seizure, numbness, or tingling. Denies uncontrolled depression  states this has improved, good response  of insomnia to med, reports anxiety, interested in referral for this Denies skin break down or rash.        Objective:   Physical Exam Pleasant well nourished female, alert and oriented x 3, in no cardio-pulmonary distress. Afebrile. HEENT No facial trauma or asymetry. No sinus tendeness  EOMI, PERTL, fundoscopic exam is normal, no hemorhage or exudate. External ears normal, tympanic membranes clear. Oropharynx moist,  no exudate, good dentition. Neck: supple, no adenopathy,JVD or thyromegaly.No bruits.  Chest: Clear to ascultation bilaterally.No crackles or wheezes. Non tender to palpation  Breast: No asymetry,no masses. No nipple discharge or inversion. No axillary or supraclavicular adenopathy  Cardiovascular system; Heart sounds normal,  S1 and  S2 ,no S3.  No murmur, or thrill. Apical beat not displaced Peripheral pulses normal.  Abdomen: Soft, non tender, no organomegaly or masses. No bruits. Bowel sounds normal. No guarding, tenderness or rebound.  GU: External genitalia normal. No lesions. Vaginal canal normal.cream discharge. Uterus normal size, no adnexal masses, no cervical motion or adnexal tenderness.  Musculoskeletal exam: Full ROM of spine, hips , shoulders and knees. No deformity ,swelling or crepitus noted. No muscle wasting or atrophy.   Neurologic: Cranial nerves 2 to 12 intact. Power, tone ,sensation and reflexes normal throughout. No disturbance in gait. No tremor.  Skin: Intact, no ulceration, erythema , scaling or rash noted. Pigmentation normal throughout  Psych; Normal mood and affect. Judgement and concentration normal        Assessment & Plan:

## 2010-06-17 LAB — GC/CHLAMYDIA PROBE AMP, GENITAL: GC Probe Amp, Genital: NEGATIVE

## 2010-06-20 LAB — BASIC METABOLIC PANEL
CO2: 26 mEq/L (ref 19–32)
Chloride: 102 mEq/L (ref 96–112)
Potassium: 3.6 mEq/L (ref 3.5–5.3)
Sodium: 138 mEq/L (ref 135–145)

## 2010-06-20 LAB — CBC WITH DIFFERENTIAL/PLATELET
Lymphocytes Relative: 29 % (ref 12–46)
Lymphs Abs: 2.2 10*3/uL (ref 0.7–4.0)
MCV: 83.5 fL (ref 78.0–100.0)
Neutro Abs: 4.9 10*3/uL (ref 1.7–7.7)
Neutrophils Relative %: 67 % (ref 43–77)
Platelets: 319 10*3/uL (ref 150–400)
RBC: 4.96 MIL/uL (ref 3.87–5.11)
WBC: 7.4 10*3/uL (ref 4.0–10.5)

## 2010-06-20 LAB — TSH: TSH: 1.315 u[IU]/mL (ref 0.350–4.500)

## 2010-06-20 LAB — LIPID PANEL
HDL: 40 mg/dL (ref >39)
LDL Cholesterol: 139 mg/dL — ABNORMAL HIGH (ref 0–99)
Total CHOL/HDL Ratio: 5.1 Ratio

## 2010-06-23 ENCOUNTER — Ambulatory Visit (INDEPENDENT_AMBULATORY_CARE_PROVIDER_SITE_OTHER): Payer: Medicaid Other | Admitting: Urgent Care

## 2010-06-23 ENCOUNTER — Encounter: Payer: Self-pay | Admitting: Urgent Care

## 2010-06-23 ENCOUNTER — Other Ambulatory Visit: Payer: Self-pay | Admitting: Urgent Care

## 2010-06-23 ENCOUNTER — Ambulatory Visit: Payer: Medicaid Other | Admitting: Urgent Care

## 2010-06-23 DIAGNOSIS — R110 Nausea: Secondary | ICD-10-CM

## 2010-06-23 DIAGNOSIS — R1110 Vomiting, unspecified: Secondary | ICD-10-CM | POA: Insufficient documentation

## 2010-06-23 NOTE — Assessment & Plan Note (Signed)
Chronic.  See vomiting.

## 2010-06-23 NOTE — Assessment & Plan Note (Signed)
Catherine Allen is a 35 y.o. female w/ several year history of chronic intermittent nausea & vomiting.  Symptoms are very sporadic & almost always associated with a trigger, ie stress, sexual intercourse, certain sounds.  This may represent central etiology or psychosomatic symptoms.  Other differentials include gastroparesis or PUD.  Will schedule EGD w/ Dr Jena Gauss to r/o PUD & consider gastric emptying study versus CT/MRI brain.    I have discussed risks & benefits which include, but are not limited to, bleeding, infection, perforation & drug reaction.  The patient agrees with this plan & written consent will be obtained.

## 2010-06-23 NOTE — Progress Notes (Signed)
Cc to PCP 

## 2010-06-23 NOTE — Progress Notes (Signed)
Referring Provider: Syliva Overman, MD Primary Care Physician:  Syliva Overman, MD, MD Primary Gastroenterologist:  Dr. Jena Gauss  Chief Complaint  Patient presents with  . Emesis    HPI:  Catherine Allen is a 35 y.o. female here as a referral from Dr. Lodema Hong for chronic nausea & vomiting.  C/o chronic nausea x several yrs.  Not every day.  Certain things cause nausea like laughing too much, worse w/ Stress or blowing nose, loose cough, sitting up in bed, certain foods.  No particular time of day.  Episodes several days per week.  Last episode was w/ clearing throat.  Denies dizziness.  Nausea worse after sex.  Vomiting several times per week.  Wt stable.  Appetite ok.  Eating 3 meals/day.  Denies heartburn, indigestion, or abd pain.  Denies dysphagia or odynophagia.  BM QOD without rectal bleeding or melena.  No constipation or diarrhea.  No new meds.    Labs from Dr Simpson's reviewed including normal CBC, TSH, Met 7 & H pylori negative.  LMP 05/22/10, last 4 days, q3 months w/ BCPs Past Medical History  Diagnosis Date  . Hemorrhoids   . Dyspareunia   . Vaginitis   . MRSA (methicillin resistant Staphylococcus aureus) 2010    boils thighs  . HTN (hypertension)    Past Surgical History  Procedure Date  . Cesarean section 2004   Current Outpatient Prescriptions  Medication Sig Dispense Refill  . ibuprofen (ADVIL,MOTRIN) 200 MG tablet Take 800 mg by mouth every 8 (eight) hours as needed. rare       . temazepam (RESTORIL) 15 MG capsule Take 15 mg by mouth. Take one tablet by mouth at bedtime as needed for anxiety and insomnia       . triamterene-hydrochlorothiazide (MAXZIDE-25) 37.5-25 MG per tablet Take one and half tablets once daily, effective 02/03/2010  45 tablet  6  . DISCONTD: fluconazole (DIFLUCAN) 150 MG tablet Take 150 mg by mouth. Take one tablet by mouth once a day as needed for vaginal itching       . DISCONTD: levonorgestrel-ethinyl estradiol (SEASONALE) 0.15-0.03 MG per  tablet Take 1 tablet by mouth daily.        Marland Kitchen DISCONTD: sulfamethoxazole-trimethoprim (BACTRIM DS) 800-160 MG per tablet Take 1 tablet by mouth 2 (two) times daily.         Allergies as of 06/23/2010  . (No Known Allergies)   Family History  Problem Relation Age of Onset  . Hypertension Mother   . Asthma Sister    History   Social History  . Marital Status: Single    Spouse Name: N/A    Number of Children: 2  . Years of Education: N/A   Occupational History  . homemaker    Social History Main Topics  . Smoking status: Never Smoker   . Smokeless tobacco: Not on file  . Alcohol Use: No  . Drug Use: No  . Sexually Active: Yes -- Female partner(s)    Birth Control/ Protection: Pill   Other Topics Concern  . Not on file   Social History Narrative   LIves w/ 2 sons 8 &16  Review of Systems: Gen: Denies any fever, chills, sweats, anorexia, fatigue, weakness, malaise, weight loss, and sleep disorder CV: Denies chest pain, angina, palpitations, syncope, orthopnea, PND, peripheral edema, and claudication. Resp: Denies dyspnea at rest, dyspnea with exercise, cough, sputum, wheezing, coughing up blood, and pleurisy. GI: Denies vomiting blood, jaundice, and fecal incontinence.   GU : Denies  urinary burning, blood in urine, urinary frequency, urinary hesitancy, nocturnal urination, and urinary incontinence. MS: Denies joint pain, limitation of movement, and swelling, stiffness, low back pain, extremity pain. Denies muscle weakness, cramps, atrophy.  Derm: Denies rash, itching, dry skin, hives, moles, warts, or unhealing ulcers.  Psych: Denies depression, anxiety, memory loss, suicidal ideation, hallucinations, paranoia, and confusion. Heme: Denies bruising, bleeding, and enlarged lymph nodes.  Physical Exam: BP 123/83  Pulse 81  Temp(Src) 97.1 F (36.2 C) (Temporal)  Ht 5\' 3"  (1.6 m)  Wt 166 lb 9.6 oz (75.569 kg)  BMI 29.51 kg/m2  LMP 05/22/2010 General:   Alert,   Well-developed, well-nourished, pleasant and cooperative in NAD Head:  Normocephalic and atraumatic. Eyes:  Sclera clear, no icterus.   Conjunctiva pink. Ears:  Normal auditory acuity. Nose:  No deformity, discharge,  or lesions. Mouth:  No deformity or lesions, dentition normal. Neck:  Supple; no masses or thyromegaly. Lungs:  Clear throughout to auscultation.   No wheezes, crackles, or rhonchi. No acute distress. Heart:  Regular rate and rhythm; no murmurs, clicks, rubs,  or gallops. Abdomen:  Soft, nontender and nondistended. No masses, hepatosplenomegaly or hernias noted. Normal bowel sounds, without guarding, and without rebound.    Msk:  Symmetrical without gross deformities. Normal posture. Pulses:  Normal pulses noted. Extremities:  Without clubbing or edema. Neurologic:  Alert and  oriented x4;  grossly normal neurologically. Skin:  Intact without significant lesions or rashes. Cervical Nodes:  No significant cervical adenopathy. Psych:  Alert and cooperative. Normal mood and affect.

## 2010-06-23 NOTE — Patient Instructions (Signed)
Nausea and Vomiting Nausea is a sick feeling that often comes before throwing up (vomiting). Vomiting is a reflex where stomach contents come out of your mouth. Vomiting can cause severe loss of body fluids (dehydration). Children and elderly adults dehydrate quickly, especially if they also have diarrhea. CAUSES  Upsetting smells, sounds, or sights.  Motion sickness.   Pregnancy.   Breathing in smoke or bad fumes.   Head injuries.   Infections.  Food poisoning.   Alcohol.   Belly (abdominal) problems.   Migraine headaches.   HOME CARE INSTRUCTIONS  Take all medicine as directed by your caregiver.   If you do not have an appetite, do not force yourself to eat.   If you have an appetite, eat a normal diet unless your caregiver tells you differently.   Eat a variety of complex carbohydrates (rice, wheat, potatoes, bread), lean meats, yogurt, fruits, and vegetables.   Avoid high fat foods because they are more difficult to digest.   Fluids are less likely to cause nausea. They can also prevent dehydration.   Watch for signs of dehydration:   Severe thirst.  Dry lips and mouth.   Dizziness.   Dark urine.   Decreasing urine frequency and amount.  Confusion.   Rapid breathing or pulse.    If any of the above signs of dehydration are present, an oral rehydration solution (ORS) should be started as directed by your caregiver. A more rapid treatment may be necessary for the elderly.  SEEK IMMEDIATE MEDICAL CARE IF:  You have blood or brown flecks (like coffee grounds) in your vomit.   You have a severe headache or stiff neck.   You are confused.   You have severe abdominal pain.   You do not urinate every 8 hours.  MAKE SURE YOU:  Understand these instructions.   Will watch your condition.   Will get help right away if you are not doing well or get worse.  Document Released: 12/26/2004 Document Re-Released: 03/22/2009 [facility] Patient Information 2011  [place], [place].

## 2010-06-24 ENCOUNTER — Telehealth: Payer: Self-pay | Admitting: Urgent Care

## 2010-06-24 NOTE — Telephone Encounter (Signed)
U preg Results given to pt

## 2010-06-25 NOTE — Assessment & Plan Note (Signed)
Uncontrolled, GI to further evaluate, I believe anxiety is th underlying problem

## 2010-06-25 NOTE — Assessment & Plan Note (Signed)
Controlled, no change in medication  

## 2010-06-25 NOTE — Assessment & Plan Note (Signed)
Deteriorated will refer for therapy, pt agrees

## 2010-06-25 NOTE — Assessment & Plan Note (Signed)
Sleep hygiene discussed, med also to continue

## 2010-06-28 ENCOUNTER — Encounter: Payer: Medicaid Other | Admitting: Internal Medicine

## 2010-06-28 ENCOUNTER — Ambulatory Visit (HOSPITAL_COMMUNITY)
Admission: RE | Admit: 2010-06-28 | Discharge: 2010-06-28 | Disposition: A | Discharge status: Home / Self Care | Payer: Medicaid Other | Source: Ambulatory Visit | Attending: Internal Medicine | Admitting: Internal Medicine

## 2010-06-28 ENCOUNTER — Other Ambulatory Visit: Payer: Self-pay | Admitting: Internal Medicine

## 2010-06-28 DIAGNOSIS — K2980 Duodenitis without bleeding: Secondary | ICD-10-CM | POA: Insufficient documentation

## 2010-06-28 DIAGNOSIS — K208 Other esophagitis: Secondary | ICD-10-CM

## 2010-06-28 DIAGNOSIS — R112 Nausea with vomiting, unspecified: Secondary | ICD-10-CM | POA: Insufficient documentation

## 2010-06-28 DIAGNOSIS — K2990 Gastroduodenitis, unspecified, without bleeding: Secondary | ICD-10-CM

## 2010-06-28 DIAGNOSIS — K2940 Chronic atrophic gastritis without bleeding: Secondary | ICD-10-CM | POA: Insufficient documentation

## 2010-06-28 DIAGNOSIS — I10 Essential (primary) hypertension: Secondary | ICD-10-CM | POA: Insufficient documentation

## 2010-06-28 DIAGNOSIS — Z79899 Other long term (current) drug therapy: Secondary | ICD-10-CM | POA: Insufficient documentation

## 2010-06-28 DIAGNOSIS — K2970 Gastritis, unspecified, without bleeding: Secondary | ICD-10-CM

## 2010-06-28 DIAGNOSIS — K449 Diaphragmatic hernia without obstruction or gangrene: Secondary | ICD-10-CM | POA: Insufficient documentation

## 2010-07-04 ENCOUNTER — Encounter: Payer: Self-pay | Admitting: Internal Medicine

## 2010-07-25 NOTE — Op Note (Signed)
NAMEKENITA, BINES              ACCOUNT NO.:  [id number]  MEDICAL RECORD NO.:  [id number]  LOCATION:  [facility]                          FACILITY:  [facility]  PHYSICIAN:  R. Roetta Sessions, MD FACP FACGDATE OF BIRTH:  09-Feb-1975  DATE OF PROCEDURE:  06/28/2010 DATE OF DISCHARGE:                              OPERATIVE REPORT   INDICATIONS FOR PROCEDURE:  A 35 year old lady with chronic with inciting worsening intermittent nausea and vomiting with often apparently had trivial inciting factors.  Not much way of typical reflux symptoms.  No alcohol.  No abdominal pain.  He has not been on acid suppression therapy.  He does take nonsteroidal's intermittently on a regular basis.  Diagnostic EGD is now being done.  Risks, benefits, limitations, alternatives and imponderables have been reviewed, questions answered.  Please see the documentation in the medical record.  PROCEDURE NOTE:  O2 saturation, blood pressure, pulse and respirations were monitored throughout entire procedure.  CONSCIOUS SEDATION: 1. Versed 5 mg IV. 2. Demerol 125 mg IV in divided doses. 3. Cetacaine spray for topical pharyngeal anesthesia.  INSTRUMENT:  Pentax video chip system.  FINDINGS:  Examination of the tubular esophagus revealed a 5-mm erosion at the GE junction, otherwise esophageal mucosa appeared entirely normal.  EGD junction easily traversed.  Stomach:  Gastric cavity was emptied and insufflated well with air.  Thorough examination of the gastric mucosa including retroflexed view of the proximal stomach, esophagogastric junction demonstrated multiple antral linear erosions and small hiatal hernia only.  Pylorus was patent and easily traversed. Examination of the bulb and second portion revealed a single 3-mm adenomatous-appearing nodule in the bulb.  THERAPEUTIC/DIAGNOSTIC MANEUVERS PERFORMED:  Biopsy of the duodenal bulbar nodule was taken.  This essentially denuded and removed it.  It was recovered for  the pathologist.  Subsequent biopsies and inflamed- appearing antrum were taken for histologic study.  The patient tolerated the procedure well, was reactive to endoscopy.  IMPRESSION: 1. Single distal esophageal erosion consistent with mild erosive     reflux esophagitis. 2. Small hiatal hernia. 3. Linear antral erosions with query NSAID effect, status post biopsy,     abnormal duodenal nodule, status post biopsy of essentially removal     with this maneuver.  It does not sound the patient is having any rumination symptoms.  She does describe actual heaving with associated emesis with a variety of inciting factors.  This may be somewhat of an exaggerated response, atypical gastroesophageal reflux disease symptoms, nonsteroidal's maybe playing a roll.  RECOMMENDATIONS: 1. Stop using nonsteroidal's for now. 2. Follow up on path. 3. Literature on hiatal hernia and GERD given to Ms. Cheetham. 4. Begin empiric trial of Protonix 40 mg orally daily, prescription     provided. 5. Followup appointment with [place] in 4-6 weeks.  We will do nothing else     at this time other than to see how she does with empiric acid     suppression.  We will assess her progress in 4-6 weeks before     deciding about further evaluation.  ADDENDUM:  The patient having denies recreational drug use including cannabis.     Jonathon Bellows, MD [facility]  RMR/MEDQ  D:  06/28/2010  T:  06/29/2010  Job:  [id number]  cc:   Dr. Lodema Hong  Electronically Signed by Lorrin Goodell M.D. on 07/25/2010 09:18:55 AM

## 2010-08-02 ENCOUNTER — Encounter: Payer: Self-pay | Admitting: Gastroenterology

## 2010-08-02 ENCOUNTER — Ambulatory Visit (INDEPENDENT_AMBULATORY_CARE_PROVIDER_SITE_OTHER): Payer: Medicaid Other | Admitting: Gastroenterology

## 2010-08-02 DIAGNOSIS — R110 Nausea: Secondary | ICD-10-CM

## 2010-08-02 NOTE — Patient Instructions (Signed)
Continue Protonix. Make sure you are taking it 30 minutes before breakfast each day.  After 1 week, call us to let us know how you are doing. If you continue to have nausea, we will proceed with further work-up as discussed.

## 2010-08-02 NOTE — Assessment & Plan Note (Addendum)
35 year old female with chronic nausea, s/p EGD recently in June with findings of mild erosive esophagitis, duodenitis, mild gastritis. She is avoiding NSAIDs. Nausea seems most related to triggers such as sights, sounds, smells. Seems to be closely related to exertional activity, changes in position. Has noted slight improvement since starting Protonix, but she is not taking correctly. Small amount of reflux, which she has not noticed before. Denies any loss of appetite; in fact, she states she has a very good appetite. Doubt we are dealing with gastroparesis at this time. GERD needs better control; may have underlying anxiety exacerbating the clinical picture. Still keep neurological etiology as differential.  I have enforced taking Protonix 30 minutes before breakfast daily. She will give this approximately one week to see if there is any change in her symptoms. If she has no significant change, we will proceed with MRI vs CT head Pt states understanding and will call us in 1 week.

## 2010-08-02 NOTE — Progress Notes (Signed)
Referring Provider: Syliva Overman, MD Primary Care Physician:  Syliva Overman, MD, MD Primary Gastroenterologist: Dr. Jena Gauss   Chief Complaint  Patient presents with  . Follow-up    HPI:   Catherine Allen returns in f/u after EGD performed June 2012 due to chronic N/V. She has had this issue for several years, and it is related to what seems to be mostly sensory triggers. If she hears coughing, sees something "gross" while out to eat, smells, sights, getting up too quickly, activity, increased motion, she feels sick.  EGD outlined in PMH. Started on Protonix; she is taking it at night. Not on empty stomach. She has had slight improvement in symptoms despite not taking as prescribed. However, now she reports intermittent reflux, which she had not noticed before.  She denies NSAIDs. Occasional headaches, but this is rare. Feels sick in the mornings due to sinus drainage. Denies dizziness or fatigue.   Past Medical History  Diagnosis Date  . Hemorrhoids   . Dyspareunia   . Vaginitis   . MRSA (methicillin resistant Staphylococcus aureus) 2010    boils thighs  . HTN (hypertension)   . S/P endoscopy June 2012    Dr. Jena Gauss: mild erosive esophagitis, chronic duodenitis, mild gastritis, stop NSAIDs.     Past Surgical History  Procedure Date  . Cesarean section 2004    Current Outpatient Prescriptions  Medication Sig Dispense Refill  . ibuprofen (ADVIL,MOTRIN) 200 MG tablet Take 800 mg by mouth every 8 (eight) hours as needed. rare       . temazepam (RESTORIL) 15 MG capsule Take 15 mg by mouth. Take one tablet by mouth at bedtime as needed for anxiety and insomnia       . triamterene-hydrochlorothiazide (MAXZIDE-25) 37.5-25 MG per tablet Take one and half tablets once daily, effective 02/03/2010  45 tablet  6    Allergies as of 08/02/2010  . (No Known Allergies)    Family History  Problem Relation Age of Onset  . Hypertension Mother   . Asthma Sister     History   Social  History  . Marital Status: Single    Spouse Name: N/A    Number of Children: 2  . Years of Education: N/A   Occupational History  . homemaker    Social History Main Topics  . Smoking status: Never Smoker   . Smokeless tobacco: None  . Alcohol Use: No  . Drug Use: No  . Sexually Active: Yes -- Female partner(s)    Birth Control/ Protection: Pill   Other Topics Concern  . None   Social History Narrative   LIves w/ 2 sons 8 &16    Review of Systems: Gen: Denies fever, chills, anorexia. Denies fatigue, weakness, weight loss.  CV: Denies chest pain, palpitations, syncope, peripheral edema, and claudication. Resp: Denies dyspnea at rest, cough, wheezing, coughing up blood, and pleurisy. GI: Denies vomiting blood, jaundice, and fecal incontinence.   Denies dysphagia or odynophagia. Derm: Denies rash, itching, dry skin Psych: Denies depression, anxiety, memory loss, confusion. No homicidal or suicidal ideation.  Heme: Denies bruising, bleeding, and enlarged lymph nodes.  Physical Exam: BP 140/88  Pulse 80  Temp(Src) 97.9 F (36.6 C) (Temporal)  Ht 5\' 3"  (1.6 m)  Wt 166 lb (75.297 kg)  BMI 29.41 kg/m2 General:   Alert and oriented. No distress noted. Pleasant and cooperative.  Head:  Normocephalic and atraumatic. Eyes:  Conjuctiva clear without scleral icterus. Mouth:  Oral mucosa pink and moist. Good dentition. No  lesions. Neck:  Supple, without mass or thyromegaly. Heart:  S1, S2 present without murmurs, rubs, or gallops. Regular rate and rhythm. Abdomen:  +BS, soft, non-tender and non-distended. No rebound or guarding. No HSM or masses noted. Msk:  Symmetrical without gross deformities. Normal posture. Extremities:  Without edema. Neurologic:  Alert and  oriented x4;  grossly normal neurologically. Skin:  Intact without significant lesions or rashes. Cervical Nodes:  No significant cervical adenopathy. Psych:  Alert and cooperative. Normal mood and affect.

## 2010-08-03 NOTE — Progress Notes (Signed)
Cc to PCP 

## 2010-08-04 ENCOUNTER — Other Ambulatory Visit: Payer: Self-pay | Admitting: Family Medicine

## 2010-10-03 ENCOUNTER — Encounter: Payer: Self-pay | Admitting: Gastroenterology

## 2010-10-03 NOTE — Progress Notes (Unsigned)
  We never heard back from pt regarding progress with Protonix. Please offer f/u visit. Hx: nausea, GERD.

## 2010-10-06 NOTE — Progress Notes (Signed)
i called patient to offer her OV to follow up, but she wants to wait and call back after checking her schedule.

## 2010-10-17 ENCOUNTER — Ambulatory Visit: Payer: Medicaid Other | Admitting: Family Medicine

## 2010-11-03 ENCOUNTER — Telehealth: Payer: Self-pay | Admitting: Family Medicine

## 2010-11-03 NOTE — Telephone Encounter (Signed)
noted 

## 2010-11-16 ENCOUNTER — Telehealth: Payer: Self-pay | Admitting: Family Medicine

## 2010-11-16 MED ORDER — TEMAZEPAM 15 MG PO CAPS: ORAL_CAPSULE | ORAL | Status: DC

## 2010-11-16 NOTE — Telephone Encounter (Signed)
Sent to [facility]

## 2010-12-05 ENCOUNTER — Other Ambulatory Visit: Payer: Self-pay | Admitting: Family Medicine

## 2010-12-07 ENCOUNTER — Telehealth: Payer: Self-pay | Admitting: Family Medicine

## 2010-12-07 MED ORDER — LEVONORGEST-ETH ESTRAD 91-DAY 0.15-0.03 MG PO TABS: 1.0000 | ORAL_TABLET | Freq: Every day | ORAL | Status: DC

## 2010-12-07 NOTE — Telephone Encounter (Signed)
refilled 

## 2010-12-21 ENCOUNTER — Encounter: Payer: Self-pay | Admitting: Family Medicine

## 2010-12-22 ENCOUNTER — Ambulatory Visit (INDEPENDENT_AMBULATORY_CARE_PROVIDER_SITE_OTHER): Payer: Medicaid Other | Admitting: Family Medicine

## 2010-12-22 ENCOUNTER — Encounter: Payer: Self-pay | Admitting: Family Medicine

## 2010-12-22 VITALS — BP 126/68 | HR 63 | Resp 18 | Ht 63.5 in | Wt 168.1 lb

## 2010-12-22 DIAGNOSIS — I10 Essential (primary) hypertension: Secondary | ICD-10-CM

## 2010-12-22 DIAGNOSIS — G4700 Insomnia, unspecified: Secondary | ICD-10-CM

## 2010-12-22 DIAGNOSIS — N760 Acute vaginitis: Secondary | ICD-10-CM

## 2010-12-22 DIAGNOSIS — F411 Generalized anxiety disorder: Secondary | ICD-10-CM

## 2010-12-22 DIAGNOSIS — E785 Hyperlipidemia, unspecified: Secondary | ICD-10-CM

## 2010-12-22 NOTE — Assessment & Plan Note (Signed)
Recurrent vaginitis will treat based on lab result

## 2010-12-22 NOTE — Progress Notes (Signed)
Subjective:     [name], female   DOB: Apr 22, 1975, 35 y.o.   MRN: [id number]  HPI 1 month h/o pruritic vaginal d/c  Was recently treated in urgent care for yeast, no odor to the d/c, no stinging.  Needs sleep med, has been out for several weeks, difficulty falling and staying asleep  Review of Systems See HPI Denies recent fever or chills. Denies sinus pressure, nasal congestion, ear pain or sore throat. Denies chest congestion, productive cough or wheezing. Denies chest pains, palpitations and leg swelling Denies abdominal pain, nausea, vomiting,diarrhea or constipation.   Denies dysuria, frequency, hesitancy or incontinence. Denies joint pain, swelling and limitation in mobility. Denies headaches, seizures, numbness, or tingling. Denies  Or  anxiety  Denies skin break down or rash.        Objective:   Physical Exam Patient alert and oriented and in no cardiopulmonary distress.  HEENT: No facial asymmetry, EOMI, no sinus tenderness,  oropharynx pink and moist.  Neck supple no adenopathy.  Chest: Clear to auscultation bilaterally.  CVS: S1, S2 no murmurs, no S3.  ABD: Soft non tender. Bowel sounds normal. Pelvic: white fishy vag d/c no cervical motion or adnexal tenderness Ext: No edema  MS: Adequate ROM spine, shoulders, hips and knees.  Skin: Intact, no ulcerations or rash noted.  Psych: Good eye contact, normal affect. Memory intact not anxious or depressed appearing.  CNS: CN 2-12 intact, power, tone and sensation normal throughout.     Assessment:        Plan:

## 2010-12-22 NOTE — Patient Instructions (Signed)
cPE end June  We will calll with results and treat appropriately.  We will try to pA med for sleep  Call in may for labsheet for June visit, this will be fasting cbc, chem 7, lipid , tSH

## 2010-12-22 NOTE — Assessment & Plan Note (Signed)
Needs restoril pA'dand refilled

## 2010-12-23 ENCOUNTER — Other Ambulatory Visit: Payer: Self-pay

## 2010-12-23 LAB — WET PREP BY MOLECULAR PROBE: Candida species: NEGATIVE

## 2010-12-23 LAB — GC/CHLAMYDIA PROBE AMP, GENITAL: GC Probe Amp, Genital: NEGATIVE

## 2010-12-23 MED ORDER — METRONIDAZOLE 500 MG PO TABS
500.0000 mg | ORAL_TABLET | Freq: Two times a day (BID) | ORAL | Status: AC
Start: 2010-12-23 — End: 2011-01-06

## 2010-12-23 NOTE — Assessment & Plan Note (Signed)
Dietary change only at this time

## 2010-12-23 NOTE — Assessment & Plan Note (Signed)
Controlled, no change in medication  

## 2010-12-23 NOTE — Assessment & Plan Note (Signed)
Markedly improved and on no med

## 2011-02-09 ENCOUNTER — Other Ambulatory Visit: Payer: Self-pay | Admitting: Family Medicine

## 2011-06-22 ENCOUNTER — Other Ambulatory Visit (HOSPITAL_COMMUNITY)
Admission: RE | Admit: 2011-06-22 | Discharge: 2011-06-22 | Disposition: A | Discharge status: Home / Self Care | Payer: Medicaid Other | Source: Ambulatory Visit | Attending: Family Medicine | Admitting: Family Medicine

## 2011-06-22 ENCOUNTER — Ambulatory Visit (INDEPENDENT_AMBULATORY_CARE_PROVIDER_SITE_OTHER): Payer: Medicaid Other | Admitting: Family Medicine

## 2011-06-22 ENCOUNTER — Encounter: Payer: Self-pay | Admitting: Family Medicine

## 2011-06-22 ENCOUNTER — Ambulatory Visit: Payer: Medicaid Other | Admitting: Family Medicine

## 2011-06-22 VITALS — BP 130/82 | HR 80 | Resp 18 | Ht 63.5 in | Wt 174.0 lb

## 2011-06-22 DIAGNOSIS — Z131 Encounter for screening for diabetes mellitus: Secondary | ICD-10-CM

## 2011-06-22 DIAGNOSIS — E785 Hyperlipidemia, unspecified: Secondary | ICD-10-CM

## 2011-06-22 DIAGNOSIS — F411 Generalized anxiety disorder: Secondary | ICD-10-CM

## 2011-06-22 DIAGNOSIS — G4700 Insomnia, unspecified: Secondary | ICD-10-CM

## 2011-06-22 DIAGNOSIS — F419 Anxiety disorder, unspecified: Secondary | ICD-10-CM

## 2011-06-22 DIAGNOSIS — Z0000 Encounter for general adult medical examination without abnormal findings: Secondary | ICD-10-CM

## 2011-06-22 DIAGNOSIS — Z01419 Encounter for gynecological examination (general) (routine) without abnormal findings: Secondary | ICD-10-CM | POA: Insufficient documentation

## 2011-06-22 DIAGNOSIS — I10 Essential (primary) hypertension: Secondary | ICD-10-CM

## 2011-06-22 DIAGNOSIS — E663 Overweight: Secondary | ICD-10-CM

## 2011-06-22 LAB — BASIC METABOLIC PANEL
Chloride: 108 mEq/L (ref 96–112)
Creat: 0.86 mg/dL (ref 0.50–1.10)
Potassium: 4 mEq/L (ref 3.5–5.3)
Sodium: 139 mEq/L (ref 135–145)

## 2011-06-22 LAB — CBC
Platelets: 302 10*3/uL (ref 150–400)
RDW: 13.1 % (ref 11.5–15.5)
WBC: 8.5 10*3/uL (ref 4.0–10.5)

## 2011-06-22 LAB — LIPID PANEL
Cholesterol: 212 mg/dL — ABNORMAL HIGH (ref 0–200)
HDL: 47 mg/dL (ref >39)
Total CHOL/HDL Ratio: 4.5 Ratio

## 2011-06-22 MED ORDER — LEVONORGEST-ETH ESTRAD 91-DAY 0.15-0.03 MG PO TABS: 1.0000 | ORAL_TABLET | Freq: Every day | ORAL | Status: DC

## 2011-06-22 MED ORDER — ALPRAZOLAM 0.25 MG PO TABS: ORAL_TABLET | ORAL | Status: DC

## 2011-06-22 NOTE — Patient Instructions (Addendum)
F/u in 4 month  Fasting cBC, chem 7, hBA1c, lipid, tSH and vit d today.  It is important that you exercise regularly at least 30 minutes 5 times a week. If you develop chest pain, have severe difficulty breathing, or feel very tired, stop exercising immediately and seek medical attention    A healthy diet is rich in fruit, vegetables and whole grains. Poultry fish, nuts and beans are a healthy choice for protein rather then red meat. A low sodium diet and drinking 64 ounces of water daily is generally recommended. Oils and sweet should be limited. Carbohydrates especially for those who are diabetic or overweight, should be limited to 30-45 gram per meal. It is important to eat on a regular schedule, at least 3 times daily. Snacks should be primarily fruits, vegetables or nuts.  Weight loss goal of 2 pounds per month  Please practice good sleep hygiene

## 2011-06-22 NOTE — Progress Notes (Signed)
  Subjective:    [name], female    DOB: [birth year], 36 y.o.   MRN: [id number]  HPI The PT is here for annual exam and re-evaluation of chronic medical conditions, medication management and review of any available recent lab and radiology data.  Preventive health is updated, specifically  Cancer screening and Immunization.   Questions or concerns regarding consultations or procedures which the PT has had in the interim are  addressed. The PT denies any adverse reactions to current medications since the last visit.  C/o increased anxiety with difficulty sleeping at times , more so in the past 3 months, when she had a broken relationship      Review of Systems See HPI Denies recent fever or chills. Denies sinus pressure, nasal congestion, ear pain or sore throat. Denies chest congestion, productive cough or wheezing. Denies chest pains, palpitations and leg swelling Denies abdominal pain, nausea, vomiting,diarrhea or constipation.   Denies dysuria, frequency, hesitancy or incontinence. Denies joint pain, swelling and limitation in mobility. Denies headaches, seizures, numbness, or tingling. Denies depression,has mild anxiety nies skin break down or rash.        Objective:   Physical Exam Pleasant well nourished female, alert and oriented x 3, in no cardio-pulmonary distress. Afebrile. HEENT No facial trauma or asymetry. Sinuses non tender.  EOMI, PERTL, fundoscopic exam is normal, no hemorhage or exudate.  External ears normal, tympanic membranes clear. Oropharynx moist, no exudate, good dentition. Neck: supple, no adenopathy,JVD or thyromegaly.No bruits.  Chest: Clear to ascultation bilaterally.No crackles or wheezes. Non tender to palpation  Breast: No asymetry,no masses. No nipple discharge or inversion. No axillary or supraclavicular adenopathy  Cardiovascular system; Heart sounds normal,  S1 and  S2 ,no S3.  No murmur, or thrill. Apical beat  not displaced Peripheral pulses normal.  Abdomen: Soft, non tender, no organomegaly or masses. No bruits. Bowel sounds normal. No guarding, tenderness or rebound.  GU: External genitalia normal. No lesions. Vaginal canal normal.fishy discharge. Uterus normal size, no adnexal masses, no cervical motion or adnexal tenderness.  Musculoskeletal exam: Full ROM of spine, hips , shoulders and knees. No deformity ,swelling or crepitus noted. No muscle wasting or atrophy.   Neurologic: Cranial nerves 2 to 12 intact. Power, tone ,sensation and reflexes normal throughout. No disturbance in gait. No tremor.  Skin: Intact, no ulceration, erythema , scaling or rash noted. Pigmentation normal throughout  Psych; Normal mood and affect. Judgement and concentration normal       Assessment & Plan:

## 2011-06-23 LAB — VITAMIN D 25 HYDROXY (VIT D DEFICIENCY, FRACTURES): Vit D, 25-Hydroxy: 29 ng/mL — ABNORMAL LOW (ref 30–89)

## 2011-06-23 LAB — HEMOGLOBIN A1C: Mean Plasma Glucose: 97 mg/dL (ref <117)

## 2011-06-24 DIAGNOSIS — Z0000 Encounter for general adult medical examination without abnormal findings: Secondary | ICD-10-CM | POA: Insufficient documentation

## 2011-06-24 NOTE — Assessment & Plan Note (Signed)
unchanged Patient re-educated about  the importance of commitment to a  minimum of 150 minutes of exercise per week. The importance of healthy food choices with portion control discussed. Encouraged to start a food diary, count calories and to consider  joining a support group. Sample diet sheets offered. Goals set by the patient for the next several months.    

## 2011-06-24 NOTE — Assessment & Plan Note (Signed)
Controlled, no change in medication  

## 2011-06-24 NOTE — Assessment & Plan Note (Signed)
Reports increased anxiety with insomnia at times, xanax prescribed to be used in limited frequency

## 2011-06-24 NOTE — Assessment & Plan Note (Addendum)
Deteriorated Hyperlipidemia:Low fat diet discussed and encouraged.   

## 2011-06-24 NOTE — Assessment & Plan Note (Signed)
Immunization and cancewr screening updated. Mental health addressed, amd the importance of regular exercise, healthy diet and achieving a healthy weight discussed

## 2011-10-30 ENCOUNTER — Other Ambulatory Visit: Payer: Self-pay | Admitting: Family Medicine

## 2011-10-30 ENCOUNTER — Ambulatory Visit: Payer: Medicaid Other | Admitting: Family Medicine

## 2011-11-07 ENCOUNTER — Ambulatory Visit: Payer: Medicaid Other | Admitting: Family Medicine

## 2011-12-08 ENCOUNTER — Other Ambulatory Visit: Payer: Self-pay | Admitting: Family Medicine

## 2012-03-09 ENCOUNTER — Other Ambulatory Visit: Payer: Self-pay | Admitting: Family Medicine

## 2012-05-27 ENCOUNTER — Ambulatory Visit (INDEPENDENT_AMBULATORY_CARE_PROVIDER_SITE_OTHER): Payer: Medicaid Other | Admitting: Family Medicine

## 2012-05-27 ENCOUNTER — Other Ambulatory Visit (HOSPITAL_COMMUNITY)
Admission: RE | Admit: 2012-05-27 | Discharge: 2012-05-27 | Disposition: A | Discharge status: Home / Self Care | Payer: Medicaid Other | Source: Ambulatory Visit | Attending: Family Medicine | Admitting: Family Medicine

## 2012-05-27 ENCOUNTER — Encounter: Payer: Self-pay | Admitting: Family Medicine

## 2012-05-27 VITALS — BP 130/90 | HR 83 | Resp 16 | Ht 63.5 in | Wt 172.8 lb

## 2012-05-27 DIAGNOSIS — Z113 Encounter for screening for infections with a predominantly sexual mode of transmission: Secondary | ICD-10-CM | POA: Insufficient documentation

## 2012-05-27 DIAGNOSIS — N760 Acute vaginitis: Secondary | ICD-10-CM | POA: Insufficient documentation

## 2012-05-27 DIAGNOSIS — R5381 Other malaise: Secondary | ICD-10-CM

## 2012-05-27 DIAGNOSIS — Z139 Encounter for screening, unspecified: Secondary | ICD-10-CM

## 2012-05-27 DIAGNOSIS — I10 Essential (primary) hypertension: Secondary | ICD-10-CM

## 2012-05-27 DIAGNOSIS — E785 Hyperlipidemia, unspecified: Secondary | ICD-10-CM

## 2012-05-27 NOTE — Assessment & Plan Note (Signed)
Sub optimal; control nio med change DASH diet and commitment to daily physical activity for a minimum of 30 minutes discussed and encouraged, as a part of hypertension management. The importance of attaining a healthy weight is also discussed.

## 2012-05-27 NOTE — Assessment & Plan Note (Signed)
Specimens sent , will treat based on result 

## 2012-05-27 NOTE — Patient Instructions (Addendum)
CPE as before.  You will be contacted with lab results i the next approximately 2 to 3 days , as soon as they are available, then you will be treated at that time  Please use condoms for STD protection  Fastoing cbc, lipid, TSH, RPR and HIV before next visit  It is important that you exercise regularly at least 30 minutes 5 times a week. If you develop chest pain, have severe difficulty breathing, or feel very tired, stop exercising immediately and seek medical attention

## 2012-05-27 NOTE — Progress Notes (Signed)
  Subjective:    [name], female    DOB: Jul 12, 1975, 37 y.o.   MRN: [id number]  HPI 10 day h/o yellow thick bvaginal d/c 3 day h/o itch, no febr chills or urinary symptoms. No PCB or dysparuenia Denies urinary symptoms, has had no fever or chills   Review of Systems See HPI Denies recent fever or chills. Denies sinus pressure, nasal congestion, ear pain or sore throat. Denies chest congestion, productive cough or wheezing. Denies chest pains, palpitations and leg swelling Denies abdominal pain, nausea, vomiting,diarrhea or constipation.          Objective:   Physical Exam  Patient alert and oriented and in no cardiopulmonary distress.  HEENT: No facial asymmetry, EOMI, no sinus tenderness,  oropharynx pink and moist.  Neck supple no adenopathy.  Chest: Clear to auscultation bilaterally.  CVS: S1, S2 no murmurs, no S3.  ABD: Soft non tender. Bowel sounds normal. Pelvic: yellow thick vaginal d/c , no visible ulcers, no cervical motion or adnexal tendernness Ext: No edema  MS: Adequate ROM spine, shoulders, hips and knees.         Assessment & Plan:

## 2012-05-29 ENCOUNTER — Telehealth: Payer: Self-pay

## 2012-05-29 DIAGNOSIS — N760 Acute vaginitis: Secondary | ICD-10-CM

## 2012-05-29 NOTE — Telephone Encounter (Signed)
Lab order to be mailed to patient

## 2012-06-14 LAB — LIPID PANEL
HDL: 42 mg/dL (ref >39)
LDL Cholesterol: 122 mg/dL — ABNORMAL HIGH (ref 0–99)
Triglycerides: 71 mg/dL (ref <150)

## 2012-06-14 LAB — CBC WITH DIFFERENTIAL/PLATELET
Basophils Absolute: 0 10*3/uL (ref 0.0–0.1)
HCT: 38.9 % (ref 36.0–46.0)
Hemoglobin: 13 g/dL (ref 12.0–15.0)
Lymphocytes Relative: 35 % (ref 12–46)
Lymphs Abs: 2.6 10*3/uL (ref 0.7–4.0)
Monocytes Absolute: 0.4 10*3/uL (ref 0.1–1.0)
Monocytes Relative: 5 % (ref 3–12)
Neutro Abs: 4.3 10*3/uL (ref 1.7–7.7)
RBC: 4.72 MIL/uL (ref 3.87–5.11)
RDW: 13.1 % (ref 11.5–15.5)
WBC: 7.4 10*3/uL (ref 4.0–10.5)

## 2012-06-17 LAB — HSV 2 ANTIBODY, IGG: HSV 2 Glycoprotein G Ab, IgG: 9.8 IV — ABNORMAL HIGH

## 2012-06-19 ENCOUNTER — Ambulatory Visit (INDEPENDENT_AMBULATORY_CARE_PROVIDER_SITE_OTHER): Payer: Medicaid Other | Admitting: Family Medicine

## 2012-06-19 ENCOUNTER — Other Ambulatory Visit (HOSPITAL_COMMUNITY)
Admission: RE | Admit: 2012-06-19 | Discharge: 2012-06-19 | Disposition: A | Discharge status: Home / Self Care | Payer: Medicaid Other | Source: Ambulatory Visit | Attending: Family Medicine | Admitting: Family Medicine

## 2012-06-19 ENCOUNTER — Encounter: Payer: Self-pay | Admitting: Family Medicine

## 2012-06-19 VITALS — BP 124/86 | HR 83 | Resp 16 | Ht 63.5 in | Wt 171.4 lb

## 2012-06-19 DIAGNOSIS — Z1151 Encounter for screening for human papillomavirus (HPV): Secondary | ICD-10-CM | POA: Insufficient documentation

## 2012-06-19 DIAGNOSIS — Z01419 Encounter for gynecological examination (general) (routine) without abnormal findings: Secondary | ICD-10-CM | POA: Insufficient documentation

## 2012-06-19 DIAGNOSIS — Z0000 Encounter for general adult medical examination without abnormal findings: Secondary | ICD-10-CM

## 2012-06-19 DIAGNOSIS — N760 Acute vaginitis: Secondary | ICD-10-CM

## 2012-06-19 DIAGNOSIS — Z113 Encounter for screening for infections with a predominantly sexual mode of transmission: Secondary | ICD-10-CM | POA: Insufficient documentation

## 2012-06-19 DIAGNOSIS — A6004 Herpesviral vulvovaginitis: Secondary | ICD-10-CM

## 2012-06-19 DIAGNOSIS — Z124 Encounter for screening for malignant neoplasm of cervix: Secondary | ICD-10-CM

## 2012-06-19 MED ORDER — ACYCLOVIR 400 MG PO TABS: 400.0000 mg | ORAL_TABLET | Freq: Two times a day (BID) | ORAL | Status: DC

## 2012-06-19 NOTE — Progress Notes (Signed)
  Subjective:    [name], female    DOB: 07/30/1975, 37 y.o.   MRN: [id number]  HPI The PT is here for annual exam  and re-evaluation of chronic medical conditions, medication management and review of any available recent lab and radiology data.  Preventive health is updated, specifically  Cancer screening and Immunization.   Questions or concerns regarding consultations or procedures which the PT has had in the interim are  addressed. The PT denies any adverse reactions to current medications since the last visit.  New for her , is a diagnosis of genital herpes exposure, she is particularly disturbed, has difficulty understanding why not tested before, also in past 6 months, has had a new partner , so this is also a concern. I attempt to explain the situation as compasionately and clearly as possible       Review of Systems See HPI Denies recent fever or chills. Denies sinus pressure, nasal congestion, ear pain or sore throat. Denies chest congestion, productive cough or wheezing. Denies chest pains, palpitations and leg swelling Denies abdominal pain, nausea, vomiting,diarrhea or constipation.   Denies dysuria, frequency, hesitancy or incontinence. Denies joint pain, swelling and limitation in mobility. Denies headaches, seizures, numbness, or tingling. Denies depression, anxiety or insomnia. Denies skin break down or rash.        Objective:   Physical Exam  Pleasant well nourished female, alert and oriented x 3, in no cardio-pulmonary distress. Afebrile. HEENT No facial trauma or asymetry. Sinuses non tender.  EOMI, PERTL, fundoscopic exam is normal, no hemorhage or exudate.  External ears normal, tympanic membranes clear. Oropharynx moist, no exudate, fair  dentition. Neck: supple, no adenopathy,JVD or thyromegaly.No bruits.  Chest: Clear to ascultation bilaterally.No crackles or wheezes. Non tender to palpation  Breast: No asymetry,no masses.  No nipple discharge or inversion. No axillary or supraclavicular adenopathy  Cardiovascular system; Heart sounds normal,  S1 and  S2 ,no S3.  No murmur, or thrill. Apical beat not displaced Peripheral pulses normal.  Abdomen: Soft, non tender, no organomegaly or masses. No bruits. Bowel sounds normal. No guarding, tenderness or rebound.   GU: External genitalia normal. No lesions. Vaginal canal normal.No discharge. Uterus normal size, no adnexal masses, no cervical motion or adnexal tenderness.  Musculoskeletal exam: Full ROM of spine, hips , shoulders and knees. No deformity ,swelling or crepitus noted. No muscle wasting or atrophy.   Neurologic: Cranial nerves 2 to 12 intact. Power, tone ,sensation and reflexes normal throughout. No disturbance in gait. No tremor.  Skin: Intact, no ulceration, erythema , scaling or rash noted. Pigmentation normal throughout  Psych; Normal mood and affect. Judgement and concentration normal       Assessment & Plan:

## 2012-06-19 NOTE — Patient Instructions (Addendum)
F/u in 5 month, call if you need me before    I recommend condom use for protection against STD  It is important that you exercise regularly at least 30 minutes 5 times a week. If you develop chest pain, have severe difficulty breathing, or feel very tired, stop exercising immediately and seek medical attention   A healthy diet is rich in fruit, vegetables and whole grains. Poultry fish, nuts and beans are a healthy choice for protein rather then red meat. A low sodium diet and drinking 64 ounces of water daily is generally recommended. Oils and sweet should be limited. Carbohydrates especially for those who are diabetic or overweight, should be limited to 30-45 gram per meal. It is important to eat on a regular schedule, at least 3 times daily. Snacks should be primarily fruits, vegetables or nuts.   You will start acyclovir twice daily for suppression    You will get recent labs and information on type 2 herpes

## 2012-06-24 ENCOUNTER — Other Ambulatory Visit: Payer: Self-pay | Admitting: Family Medicine

## 2012-06-25 ENCOUNTER — Other Ambulatory Visit: Payer: Self-pay

## 2012-06-25 ENCOUNTER — Telehealth: Payer: Self-pay | Admitting: Family Medicine

## 2012-06-25 MED ORDER — FLUCONAZOLE 150 MG PO TABS: ORAL_TABLET | ORAL | Status: DC

## 2012-06-25 NOTE — Telephone Encounter (Signed)
Patient aware of results.

## 2012-06-29 NOTE — Assessment & Plan Note (Signed)
Pelvic and breast exam as documented. ounseld re  Need for commitment to regular exercise, healthy food choice and regular condom use  Immunization is up to date

## 2012-06-29 NOTE — Assessment & Plan Note (Signed)
Suppressive treatment

## 2012-07-01 ENCOUNTER — Other Ambulatory Visit: Payer: Self-pay

## 2012-07-01 MED ORDER — FLUCONAZOLE 150 MG PO TABS: ORAL_TABLET | ORAL | Status: DC

## 2012-07-08 ENCOUNTER — Other Ambulatory Visit: Payer: Self-pay

## 2012-07-08 MED ORDER — ACYCLOVIR 800 MG PO TABS: ORAL_TABLET | ORAL | Status: DC

## 2012-08-08 ENCOUNTER — Other Ambulatory Visit: Payer: Self-pay | Admitting: Family Medicine

## 2012-09-08 ENCOUNTER — Other Ambulatory Visit: Payer: Self-pay | Admitting: Family Medicine

## 2012-11-14 ENCOUNTER — Other Ambulatory Visit: Payer: Self-pay

## 2012-11-25 ENCOUNTER — Encounter: Payer: Self-pay | Admitting: Family Medicine

## 2012-11-25 ENCOUNTER — Ambulatory Visit (INDEPENDENT_AMBULATORY_CARE_PROVIDER_SITE_OTHER): Payer: Medicaid Other | Admitting: Family Medicine

## 2012-11-25 VITALS — BP 160/100 | HR 77 | Resp 16 | Ht 63.5 in | Wt 169.4 lb

## 2012-11-25 DIAGNOSIS — A6004 Herpesviral vulvovaginitis: Secondary | ICD-10-CM

## 2012-11-25 DIAGNOSIS — E785 Hyperlipidemia, unspecified: Secondary | ICD-10-CM

## 2012-11-25 DIAGNOSIS — I10 Essential (primary) hypertension: Secondary | ICD-10-CM

## 2012-11-25 DIAGNOSIS — G4700 Insomnia, unspecified: Secondary | ICD-10-CM

## 2012-11-25 DIAGNOSIS — F419 Anxiety disorder, unspecified: Secondary | ICD-10-CM

## 2012-11-25 DIAGNOSIS — F411 Generalized anxiety disorder: Secondary | ICD-10-CM

## 2012-11-25 MED ORDER — ACYCLOVIR 400 MG PO TABS: 400.0000 mg | ORAL_TABLET | Freq: Two times a day (BID) | ORAL | Status: AC

## 2012-11-25 MED ORDER — ALPRAZOLAM 0.25 MG PO TABS: ORAL_TABLET | ORAL | Status: AC

## 2012-11-25 MED ORDER — TEMAZEPAM 15 MG PO CAPS: 15.0000 mg | ORAL_CAPSULE | Freq: Every evening | ORAL | Status: AC | PRN

## 2012-11-25 MED ORDER — TRIAMTERENE-HCTZ 75-50 MG PO TABS: 1.0000 | ORAL_TABLET | Freq: Every day | ORAL | Status: DC

## 2012-11-25 MED ORDER — BUSPIRONE HCL 5 MG PO TABS: 5.0000 mg | ORAL_TABLET | Freq: Two times a day (BID) | ORAL | Status: AC

## 2012-11-25 NOTE — Progress Notes (Signed)
  Subjective:    [name], female    DOB: January 13, 1975, 37 y.o.   MRN: [id number]  HPI The PT is here for follow up and re-evaluation of chronic medical conditions, medication management and review of any available recent lab and radiology data.  Preventive health is updated, specifically  Cancer screening and Immunization.   Questions or concerns regarding consultations or procedures which the PT has had in the interim are  addressed. The PT denies any adverse reactions to current medications since the last visit.  C/o increased and uncontrolled anxiety with panic attacks in the last 3 to 4 months. 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 Requests daily suppressive therapy, despite the fact that she is monogamous, partner is also positive and she has no ulcers    Review of Systems See HPI Denies recent fever or chills. Denies sinus pressure, nasal congestion, ear pain or sore throat. Denies chest congestion, productive cough or wheezing. Denies chest pains, palpitations and leg swelling Denies abdominal pain, nausea, vomiting,diarrhea or constipation.   Denies dysuria, frequency, hesitancy or incontinence. Denies joint pain, swelling and limitation in mobility. Denies headaches, seizures, numbness, or tingling.  Denies skin break down or rash.        Objective:   Physical Exam  Patient alert and oriented and in no cardiopulmonary distress.  HEENT: No facial asymmetry, EOMI, no sinus tenderness,  oropharynx pink and moist.  Neck supple no adenopathy.  Chest: Clear to auscultation bilaterally.  CVS: S1, S2 no murmurs, no S3.  ABD: Soft non tender. Bowel sounds normal.  Ext: No edema  MS: Adequate ROM spine, shoulders, hips and knees.  Skin: Intact, no ulcerations or rash noted.  Psych: Good eye contact, normal affect. Memory intact mildly  anxious not  depressed appearing.  CNS: CN 2-12 intact, power,  tone and sensation normal throughout.       Assessment & Plan:

## 2012-11-25 NOTE — Patient Instructions (Addendum)
F/u in 7 weeks, call if you need me before  Blood pressure is high, new dose of medication prescribed, oK to take TWO maxzide 25 mg daily till done  Daily acyclovir prescribed as requested  Please start exercise every day for 30 minutes to help blood pressure and anxiety.  New for anxiety bupar every day, use xanax only if having panic attack and LIMIT use to max of 3 tablets per week  As xanax is very addictive potentially.  You will get information on   Good sleep habits, please practice these. New for sleep is restoril at bedtime  Fasting lipid and chem 7 in 7 weeks, before f/u visit  Reconsider the flu vaccine please

## 2012-11-30 NOTE — Assessment & Plan Note (Signed)
Sleep hygiene reviewed. Start restoril, hopefully short term use only Commit to regular exercise

## 2012-11-30 NOTE — Assessment & Plan Note (Signed)
Hyperlipidemia:Low fat diet discussed and encouraged.  updated lab next visit 

## 2012-11-30 NOTE — Assessment & Plan Note (Signed)
Uncontrolled, dose increase in maxzide DASH diet and commitment to daily physical activity for a minimum of 30 minutes discussed and encouraged, as a part of hypertension management. The importance of attaining a healthy weight is also discussed.  

## 2012-11-30 NOTE — Assessment & Plan Note (Signed)
deteriortaed and uncontrolled with intermittent panic attacks. Judicious use of xanax for panic and chronic buspar

## 2012-11-30 NOTE — Assessment & Plan Note (Signed)
Despite lack of acute flare, pt prefers to take daily suppressive therapy. Still very stressed about the diagnosis, her partner , is however supportive

## 2012-12-07 ENCOUNTER — Other Ambulatory Visit: Payer: Self-pay | Admitting: Family Medicine

## 2013-01-14 ENCOUNTER — Ambulatory Visit: Payer: Medicaid Other | Admitting: Family Medicine

## 2013-02-04 ENCOUNTER — Ambulatory Visit: Payer: Medicaid Other | Admitting: Family Medicine

## 2013-03-05 ENCOUNTER — Other Ambulatory Visit: Payer: Self-pay | Admitting: Family Medicine

## 2013-05-20 ENCOUNTER — Telehealth: Payer: Self-pay

## 2013-05-20 NOTE — Telephone Encounter (Signed)
States she thinks she has a boil on her backside, first available appt not until next Wednesday. Hard knot about the size of a nickel. Sore and has been using warm compresses. Told her since her appt was so far out that I would send you a message for advice about what to do until then

## 2013-05-20 NOTE — Telephone Encounter (Signed)
pls send in doxycycline 100mg  one twice daily #14 and fluconazole 150mg  daily as needed #2 and let her know

## 2013-05-21 ENCOUNTER — Other Ambulatory Visit: Payer: Self-pay

## 2013-05-21 MED ORDER — FLUCONAZOLE 150 MG PO TABS: 150.0000 mg | ORAL_TABLET | Freq: Once | ORAL | Status: DC

## 2013-05-21 MED ORDER — DOXYCYCLINE HYCLATE 100 MG PO TABS: 100.0000 mg | ORAL_TABLET | Freq: Two times a day (BID) | ORAL | Status: DC

## 2013-05-21 NOTE — Telephone Encounter (Signed)
Patient called back and is aware.

## 2013-05-21 NOTE — Telephone Encounter (Signed)
meds called in. Called pt and left message that I was calling in meds for her and to call me back at the office to confirm she received message

## 2013-05-28 ENCOUNTER — Encounter: Payer: Self-pay | Admitting: Family Medicine

## 2013-05-28 ENCOUNTER — Ambulatory Visit (INDEPENDENT_AMBULATORY_CARE_PROVIDER_SITE_OTHER): Payer: Medicaid Other | Admitting: Family Medicine

## 2013-05-28 VITALS — BP 130/86 | HR 85 | Resp 16 | Ht 63.5 in | Wt 165.0 lb

## 2013-05-28 DIAGNOSIS — E785 Hyperlipidemia, unspecified: Secondary | ICD-10-CM

## 2013-05-28 DIAGNOSIS — L0232 Furuncle of buttock: Secondary | ICD-10-CM

## 2013-05-28 DIAGNOSIS — L0233 Carbuncle of buttock: Secondary | ICD-10-CM

## 2013-05-28 DIAGNOSIS — I10 Essential (primary) hypertension: Secondary | ICD-10-CM

## 2013-05-28 NOTE — Patient Instructions (Addendum)
Annual physical exam in July, with pap   Fasting lipid, chem 7 ,TSH, and CBC in July before visit

## 2013-06-02 DIAGNOSIS — L0232 Furuncle of buttock: Secondary | ICD-10-CM | POA: Insufficient documentation

## 2013-06-02 NOTE — Progress Notes (Signed)
   Subjective:    [name], female    DOB: [birth year], 38 y.o.   MRN: [id number]  HPI Pt in for evaluation of a boil which started last week, she called in and was started on oral antibiotic. Boil did drain purulent material, but  Now closed up, no longer painful, but she still feels residual nodule in area and wants this checked. No fever, chills or constitutional symptoms with illness, unaware of trauma to area inciting the boil   Review of Systems See HPI Denies recent fever or chills. Denies sinus pressure, nasal congestion, ear pain or sore throat. Denies chest congestion, productive cough or wheezing. Denies chest pains, palpitations and leg swelling Denies abdominal pain, nausea, vomiting,diarrhea or constipation.   Denies dysuria, frequency, hesitancy or incontinence       Objective:   Physical Exam  BP 130/86  Pulse 85  Resp 16  Ht 5' 3.5" (1.613 m)  Wt 165 lb (74.844 kg)  BMI 28.77 kg/m2  SpO2 97% Patient alert and oriented and in no cardiopulmonary distress.  HEENT: No facial asymmetry, EOMI,   oropharynx pink and moist.  Neck supple no adenopathy. Chest: Clear to auscultation bilaterally.  CVS: S1, S2 no murmurs, no S3.  ABD: Soft non tender.  Ext: No edema  MS: Adequate ROM spine, shoulders, hips and knees.  Skin: Intact, healed boil noted on upper left buttock, hyperpigmented skin non tender, no drainage, nodule approx 2.5 cm palpated under skin  Psych: Good eye contact, normal affect. Memory intact not anxious or depressed appearing.  CNS: CN 2-12 intact, power, normal throughout.       Assessment & Plan:  Boil of buttock Abscess of left buttock almost totally resolved with oral antibiotic. Pt to complete course, no further management needed. Educated re avoidance of scratching skin and trauma to skinm to cause breakdown and increase risk of skin infection  HYPERTENSION Sub optimal Controll no change in medication DASH diet  and commitment to daily physical activity for a minimum of 30 minutes discussed and encouraged, as a part of hypertension management. The importance of attaining a healthy weight is also discussed.

## 2013-06-02 NOTE — Assessment & Plan Note (Signed)
Abscess of left buttock almost totally resolved with oral antibiotic. Pt to complete course, no further management needed. Educated re avoidance of scratching skin and trauma to skinm to cause breakdown and increase risk of skin infection

## 2013-06-02 NOTE — Assessment & Plan Note (Addendum)
Sub optimal Controll no change in medication DASH diet and commitment to daily physical activity for a minimum of 30 minutes discussed and encouraged, as a part of hypertension management. The importance of attaining a healthy weight is also discussed.

## 2013-06-07 ENCOUNTER — Other Ambulatory Visit: Payer: Self-pay | Admitting: Family Medicine

## 2013-08-08 ENCOUNTER — Other Ambulatory Visit (HOSPITAL_COMMUNITY)
Admission: RE | Admit: 2013-08-08 | Discharge: 2013-08-08 | Disposition: A | Discharge status: Home / Self Care | Payer: Medicaid Other | Source: Ambulatory Visit | Attending: Family Medicine | Admitting: Family Medicine

## 2013-08-08 ENCOUNTER — Ambulatory Visit (INDEPENDENT_AMBULATORY_CARE_PROVIDER_SITE_OTHER): Payer: Medicaid Other | Admitting: Family Medicine

## 2013-08-08 ENCOUNTER — Encounter: Payer: Self-pay | Admitting: Family Medicine

## 2013-08-08 VITALS — BP 160/100 | HR 84 | Resp 16 | Ht 63.5 in | Wt 168.4 lb

## 2013-08-08 DIAGNOSIS — Z124 Encounter for screening for malignant neoplasm of cervix: Secondary | ICD-10-CM

## 2013-08-08 DIAGNOSIS — Z0000 Encounter for general adult medical examination without abnormal findings: Secondary | ICD-10-CM | POA: Insufficient documentation

## 2013-08-08 DIAGNOSIS — I10 Essential (primary) hypertension: Secondary | ICD-10-CM

## 2013-08-08 DIAGNOSIS — Z113 Encounter for screening for infections with a predominantly sexual mode of transmission: Secondary | ICD-10-CM

## 2013-08-08 DIAGNOSIS — N760 Acute vaginitis: Secondary | ICD-10-CM | POA: Insufficient documentation

## 2013-08-08 DIAGNOSIS — F418 Other specified anxiety disorders: Secondary | ICD-10-CM | POA: Insufficient documentation

## 2013-08-08 DIAGNOSIS — Z01419 Encounter for gynecological examination (general) (routine) without abnormal findings: Secondary | ICD-10-CM | POA: Diagnosis not present

## 2013-08-08 DIAGNOSIS — F419 Anxiety disorder, unspecified: Secondary | ICD-10-CM

## 2013-08-08 DIAGNOSIS — F411 Generalized anxiety disorder: Secondary | ICD-10-CM

## 2013-08-08 MED ORDER — AMLODIPINE BESYLATE 2.5 MG PO TABS: 2.5000 mg | ORAL_TABLET | Freq: Every day | ORAL | Status: DC

## 2013-08-08 MED ORDER — FLUOXETINE HCL 10 MG PO CAPS: 10.0000 mg | ORAL_CAPSULE | Freq: Every day | ORAL | Status: DC

## 2013-08-08 MED ORDER — FLUOXETINE HCL (PMDD) 10 MG PO TABS: 1.0000 | ORAL_TABLET | Freq: Every day | ORAL | Status: DC

## 2013-08-08 NOTE — Assessment & Plan Note (Addendum)
Anxiety and poor impulse control,states has benefited in the past from medcation and feels she needs help start fluoxetine 10 mg

## 2013-08-08 NOTE — Progress Notes (Signed)
   Subjective:    [name], female    DOB: 12/10/1975, 38 y.o.   MRN: [id number]  HPI Patient is in for annual  exam. C/o excessive under arm sweating , no good response from any deodorants C/o irritability and poor impulse control, has been known to throw things Requests STD testing  Review of Systems See HPI     Objective:   Physical Exam BP 160/100  Pulse 84  Resp 16  Ht 5' 3.5" (1.613 m)  Wt 168 lb 6.4 oz (76.386 kg)  BMI 29.36 kg/m2  SpO2 99% Pleasant well nourished female, alert and oriented x 3, in no cardio-pulmonary distress. Afebrile. HEENT No facial trauma or asymetry. Sinuses non tender.  EOMI, PERTL, fundoscopic exam  no hemorhage or exudate.  External ears normal, tympanic membranes clear. Oropharynx moist, no exudate,fairly good dentition. Neck: supple, no adenopathy,JVD or thyromegaly.No bruits.  Chest: Clear to ascultation bilaterally.No crackles or wheezes. Non tender to palpation  Breast: No asymetry,no masses or lumps. No tenderness. No nipple discharge or inversion. No axillary or supraclavicular adenopathy  Cardiovascular system; Heart sounds normal,  S1 and  S2 ,no S3.  No murmur, or thrill. Apical beat not displaced Peripheral pulses normal.  Abdomen: Soft, non tender, no organomegaly or masses. No bruits. Bowel sounds normal. No guarding, tenderness or rebound.    GU: External genitalia normal female genitalia , female distribution of hair. No lesions. Urethral meatus normal in size, no  Prolapse, no lesions visibly  Present. Bladder non tender. Vagina pink and moist , with no visible lesions ,thick  white  discharge present . Adequate pelvic support no  cystocele or rectocele noted Cervix pink and appears healthy, no lesions or ulcerations noted, white discharge noted from os Uterus normal size, no adnexal masses, no cervical motion or adnexal tenderness.   Musculoskeletal exam: Full ROM of spine, hips ,  shoulders and knees. No deformity ,swelling or crepitus noted. No muscle wasting or atrophy.   Neurologic: Cranial nerves 2 to 12 intact. Power, tone ,sensation and reflexes normal throughout. No disturbance in gait. No tremor.  Skin: Intact, no ulceration, erythema , scaling or rash noted. Pigmentation normal throughout  Psych; Normal mood and affect. Judgement and concentration normal        Assessment & Plan:  HYPERTENSION Uncontrolled, add amlodipine daily DASH diet and commitment to daily physical activity for a minimum of 30 minutes discussed and encouraged, as a part of hypertension management. The importance of attaining a healthy weight is also discussed.   Anxiety Anxiety and poor impulse control,states has benefited in the past from medcation and feels she needs help start fluoxetine 10 mg  Annual physical exam Annual exam as documented. Counseling done  re healthy lifestyle involving commitment to 150 minutes exercise per week, heart healthy diet, and attaining healthy weight.The importance of adequate sleep also discussed.  Changes in health habits are decided on by the patient with goals and time frames  set for achieving them. Immunization and cancer screening needs are specifically addressed at this visit.   Vaginitis and vulvovaginitis Specimens sent for testing

## 2013-08-08 NOTE — Patient Instructions (Addendum)
F/u in 2 month, call if yiou need me before  BP is high today additional; new medication amlodipine daily Continue triamterene  Fasting labs as soon as possible  New medication to hellp with anxiety and impulse control, fluoxetine  USE OTC drying agent "drysol" under arms for excessive sweating or may try cornstarch  As a drying agent  Ok to use oTC wax softener in ears (drops ) no need for ear flush

## 2013-08-08 NOTE — Assessment & Plan Note (Signed)
Specimens sent for testing 

## 2013-08-08 NOTE — Assessment & Plan Note (Addendum)
Uncontrolled, add amlodipine daily DASH diet and commitment to daily physical activity for a minimum of 30 minutes discussed and encouraged, as a part of hypertension management. The importance of attaining a healthy weight is also discussed.

## 2013-08-08 NOTE — Assessment & Plan Note (Signed)
Annual exam as documented. Counseling done  re healthy lifestyle involving commitment to 150 minutes exercise per week, heart healthy diet, and attaining healthy weight.The importance of adequate sleep also discussed. Changes in health habits are decided on by the patient with goals and time frames  set for achieving them. Immunization and cancer screening needs are specifically addressed at this visit. 

## 2013-08-12 LAB — CYTOLOGY - PAP

## 2013-08-22 LAB — CBC WITH DIFFERENTIAL/PLATELET
Basophils Absolute: 0 10*3/uL (ref 0.0–0.1)
Basophils Relative: 0 % (ref 0–1)
Eosinophils Absolute: 0.1 10*3/uL (ref 0.0–0.7)
Eosinophils Relative: 2 % (ref 0–5)
HCT: 39.9 % (ref 36.0–46.0)
Hemoglobin: 13.4 g/dL (ref 12.0–15.0)
LYMPHS ABS: 2.3 10*3/uL (ref 0.7–4.0)
LYMPHS PCT: 33 % (ref 12–46)
MCH: 27.7 pg (ref 26.0–34.0)
MCHC: 33.6 g/dL (ref 30.0–36.0)
MCV: 82.6 fL (ref 78.0–100.0)
Monocytes Absolute: 0.4 10*3/uL (ref 0.1–1.0)
Monocytes Relative: 5 % (ref 3–12)
NEUTROS ABS: 4.3 10*3/uL (ref 1.7–7.7)
Neutrophils Relative %: 60 % (ref 43–77)
PLATELETS: 268 10*3/uL (ref 150–400)
RBC: 4.83 MIL/uL (ref 3.87–5.11)
RDW: 13 % (ref 11.5–15.5)
WBC: 7.1 10*3/uL (ref 4.0–10.5)

## 2013-08-22 LAB — LIPID PANEL
CHOLESTEROL: 178 mg/dL (ref 0–200)
HDL: 43 mg/dL (ref >39)
LDL Cholesterol: 115 mg/dL — ABNORMAL HIGH (ref 0–99)
Total CHOL/HDL Ratio: 4.1 Ratio
Triglycerides: 100 mg/dL (ref <150)
VLDL: 20 mg/dL (ref 0–40)

## 2013-08-22 LAB — BASIC METABOLIC PANEL
BUN: 12 mg/dL (ref 6–23)
CALCIUM: 8.9 mg/dL (ref 8.4–10.5)
CO2: 24 mEq/L (ref 19–32)
CREATININE: 1.01 mg/dL (ref 0.50–1.10)
Chloride: 104 mEq/L (ref 96–112)
Glucose, Bld: 87 mg/dL (ref 70–99)
Potassium: 3.8 mEq/L (ref 3.5–5.3)
Sodium: 139 mEq/L (ref 135–145)

## 2013-08-23 ENCOUNTER — Encounter: Payer: Self-pay | Admitting: Family Medicine

## 2013-08-23 LAB — TSH: TSH: 1.325 u[IU]/mL (ref 0.350–4.500)

## 2013-08-23 LAB — HIV ANTIBODY (ROUTINE TESTING W REFLEX): HIV 1&2 Ab, 4th Generation: NONREACTIVE

## 2013-09-14 ENCOUNTER — Other Ambulatory Visit: Payer: Self-pay | Admitting: Family Medicine

## 2013-12-15 ENCOUNTER — Other Ambulatory Visit: Payer: Self-pay | Admitting: Family Medicine

## 2013-12-24 ENCOUNTER — Ambulatory Visit (INDEPENDENT_AMBULATORY_CARE_PROVIDER_SITE_OTHER): Payer: Medicaid Other | Admitting: Family Medicine

## 2013-12-24 ENCOUNTER — Encounter: Payer: Self-pay | Admitting: Family Medicine

## 2013-12-24 VITALS — BP 160/98 | HR 84 | Resp 16 | Ht 63.5 in | Wt 171.0 lb

## 2013-12-24 DIAGNOSIS — F411 Generalized anxiety disorder: Secondary | ICD-10-CM

## 2013-12-24 DIAGNOSIS — I10 Essential (primary) hypertension: Secondary | ICD-10-CM

## 2013-12-24 MED ORDER — AMLODIPINE BESYLATE 2.5 MG PO TABS: 2.5000 mg | ORAL_TABLET | Freq: Every day | ORAL | Status: DC

## 2013-12-24 MED ORDER — TRIAMTERENE-HCTZ 75-50 MG PO TABS
1.0000 | ORAL_TABLET | Freq: Every day | ORAL | Status: DC
Start: 2013-12-24 — End: 2014-04-04

## 2013-12-24 MED ORDER — FLUOXETINE HCL 10 MG PO CAPS
10.0000 mg | ORAL_CAPSULE | Freq: Every day | ORAL | Status: DC
Start: 2013-12-24 — End: 2014-08-04

## 2013-12-24 MED ORDER — LEVONORGEST-ETH ESTRAD 91-DAY 0.15-0.03 MG PO TABS: 1.0000 | ORAL_TABLET | Freq: Every day | ORAL | Status: DC

## 2013-12-24 NOTE — Patient Instructions (Signed)
F/u in 6 weeks, call if you need me before  Three months of medication are sent in  You NEED to get your card corrected as soon as possible   BP is high, important to take both medications as prescribed for your bP and to KEEP follow up appt

## 2013-12-28 NOTE — Assessment & Plan Note (Signed)
Uncontrolled, she is out of her regular medication. Importance of med ocmpliance is stressed DASH diet and commitment to daily physical activity for a minimum of 30 minutes discussed and encouraged, as a part of hypertension management. The importance of attaining a healthy weight is also discussed.

## 2014-01-13 NOTE — Progress Notes (Signed)
   Subjective:    [name], female    DOB: 06/03/1975, 39 y.o.   MRN: [id number]  HPI The PT is here for follow up and re-evaluation of chronic medical conditions, medication management and review of any available recent lab and radiology data.  Preventive health is updated, specifically  Cancer screening and Immunization.   Has been out of her medication for blood pressure for over 1 week, and needs fluoxetine refilled also. Problems with info on her medicaid card have prevented  Her from  Getting her meds    Review of Systems See HPI Denies recent fever or chills. Denies sinus pressure, nasal congestion, ear pain or sore throat. Denies chest congestion, productive cough or wheezing. Denies chest pains, palpitations and leg swelling Denies abdominal pain, nausea, vomiting,diarrhea or constipation.   Denies dysuria, frequency, hesitancy or incontinence. Denies joint pain, swelling and limitation in mobility. Denies headaches, seizures, numbness, or tingling. Denies depression, anxiety or insomnia. Denies skin break down or rash.        Objective:   Physical Exam BP 160/98 mmHg  Pulse 84  Resp 16  Ht 5' 3.5" (1.613 m)  Wt 171 lb (77.565 kg)  BMI 29.81 kg/m2  SpO2 98% Patient alert and oriented and in no cardiopulmonary distress.  HEENT: No facial asymmetry, EOMI,   oropharynx pink and moist.  Neck supple no JVD, no mass.  Chest: Clear to auscultation bilaterally.  CVS: S1, S2 no murmurs, no S3.Regular rate.  ABD: Soft non tender.   Ext: No edema  MS: Adequate ROM spine, shoulders, hips and knees.  Skin: Intact, no ulcerations or rash noted.  Psych: Good eye contact, normal affect. Memory intact not anxious or depressed appearing.  CNS: CN 2-12 intact, power,  normal throughout.no focal deficits noted.        Assessment & Plan:  Essential hypertension Uncontrolled, she is out of her regular medication. Importance of med ocmpliance is  stressed DASH diet and commitment to daily physical activity for a minimum of 30 minutes discussed and encouraged, as a part of hypertension management. The importance of attaining a healthy weight is also discussed.   Anxiety state Controlled on fluoxetine which she will continue

## 2014-01-13 NOTE — Assessment & Plan Note (Signed)
Controlled on fluoxetine which she will continue

## 2014-01-22 ENCOUNTER — Ambulatory Visit (INDEPENDENT_AMBULATORY_CARE_PROVIDER_SITE_OTHER): Payer: Medicaid Other | Admitting: Family Medicine

## 2014-01-22 VITALS — BP 148/100 | HR 98 | Temp 99.2°F | Resp 18 | Ht 63.5 in | Wt 170.0 lb

## 2014-01-22 DIAGNOSIS — I10 Essential (primary) hypertension: Secondary | ICD-10-CM

## 2014-01-22 DIAGNOSIS — J309 Allergic rhinitis, unspecified: Secondary | ICD-10-CM | POA: Insufficient documentation

## 2014-01-22 DIAGNOSIS — J302 Other seasonal allergic rhinitis: Secondary | ICD-10-CM

## 2014-01-22 MED ORDER — PREDNISONE 5 MG PO TABS: 5.0000 mg | ORAL_TABLET | Freq: Two times a day (BID) | ORAL | Status: AC

## 2014-01-22 MED ORDER — MOMETASONE FUROATE 50 MCG/ACT NA SUSP: 2.0000 | Freq: Every day | NASAL | Status: DC

## 2014-01-22 MED ORDER — MONTELUKAST SODIUM 10 MG PO TABS: 10.0000 mg | ORAL_TABLET | Freq: Every day | ORAL | Status: DC

## 2014-01-22 MED ORDER — PROMETHAZINE-DM 6.25-15 MG/5ML PO SYRP: ORAL_SOLUTION | ORAL | Status: DC

## 2014-01-22 MED ORDER — AMLODIPINE BESYLATE 5 MG PO TABS: 5.0000 mg | ORAL_TABLET | Freq: Every day | ORAL | Status: DC

## 2014-01-22 NOTE — Patient Instructions (Addendum)
F/u as before, call if you need me sooner.  You are treated for uncontrolled allergies, nasonex, singulair are  Taken/ used every day.Prenisone for 5 days and cough suppressant att bedtime as needed  BP is too high, INCREASE amlodipine dose to TWO 2.5mg  tabs at bedtime till done, NEW SCRIPT is for 5mg  oNE tab at bedtime

## 2014-01-22 NOTE — Assessment & Plan Note (Signed)
Uncontrolled, increase amlodipine to 5 mg

## 2014-01-22 NOTE — Progress Notes (Signed)
   Subjective:    [name], female    DOB: 04/10/1975, 39 y.o.   MRN: [id number]  HPI 1.5 week h/o worsening head and chest congestion, no fevr or chills, drainage is clear, currently on no meds for allergies, feels well other than the drainage and excess cough espescialy when she lies down feels drainage at back of throat  Review of Systems See HPI  Denies chest pains, palpitations and leg swelling Denies abdominal pain, nausea, vomiting,diarrhea or constipation.   Denies dysuria, frequency, hesitancy or incontinence. Denies joint pain, swelling and limitation in mobility. Denies headaches, seizures, numbness, or tingling. Denies depression, anxiety or insomnia. Denies skin break down or rash.        Objective:   Physical Exam BP 148/100 mmHg  Pulse 98  Temp(Src) 99.2 F (37.3 C)  Resp 18  Ht 5' 3.5" (1.613 m)  Wt 170 lb (77.111 kg)  BMI 29.64 kg/m2  SpO2 97% Patient alert and oriented and in no cardiopulmonary distress.  HEENT: No facial asymmetry, EOMI,   oropharynx pink and moist.  Neck supple no JVD, no mass.nO  Sinus tenderness, TM clear, nasal mucosa erythematous and edematos Chest: adequate air entry, scattered crackles, no wheezes  CVS: S1, S2 no murmurs, no S3.Regular rate.  ABD: Soft non tender.   Ext: No edema  MS: Adequate ROM spine, shoulders, hips and knees.  Skin: Intact, no ulcerations or rash noted.  Psych: Good eye contact, normal affect. Memory intact not anxious or depressed appearing.  CNS: CN 2-12 intact, power,  normal throughout.no focal deficits noted.        Assessment & Plan:  Essential hypertension Uncontrolled, increase amlodipine to 5 mg   Allergic rhinitis Uncontrolled , start daily meds for allergies, also saline nasal flushes, Cough suppressant for npocturnal use also prescribed

## 2014-01-25 ENCOUNTER — Encounter: Payer: Self-pay | Admitting: Family Medicine

## 2014-01-25 NOTE — Assessment & Plan Note (Signed)
Uncontrolled , start daily meds for allergies, also saline nasal flushes, Cough suppressant for npocturnal use also prescribed

## 2014-02-04 ENCOUNTER — Ambulatory Visit (INDEPENDENT_AMBULATORY_CARE_PROVIDER_SITE_OTHER): Payer: Medicaid Other | Admitting: Family Medicine

## 2014-02-04 ENCOUNTER — Encounter: Payer: Self-pay | Admitting: Family Medicine

## 2014-02-04 VITALS — BP 134/84 | HR 78 | Resp 16 | Ht 63.5 in | Wt 172.8 lb

## 2014-02-04 DIAGNOSIS — I10 Essential (primary) hypertension: Secondary | ICD-10-CM

## 2014-02-04 DIAGNOSIS — E785 Hyperlipidemia, unspecified: Secondary | ICD-10-CM

## 2014-02-04 NOTE — Progress Notes (Signed)
   Subjective:    [name], female    DOB: 04/30/1975, 39 y.o.   MRN: [id number]  HPI Pt in for f/u uncontrolled hypertension, she denies any adverse s/e from her current regie She intends to work on lifestyle modification for improved health and blood pressure   Review of Systems See HPI Denies recent fever or chills. Denies sinus pressure, nasal congestion, ear pain or sore throat. Denies chest congestion, productive cough or wheezing. Denies chest pains, palpitations and leg swelling Denies skin break down or rash.        Objective:   Physical Exam BP 134/84 mmHg  Pulse 78  Resp 16  Ht 5' 3.5" (1.613 m)  Wt 172 lb 12.8 oz (78.382 kg)  BMI 30.13 kg/m2  SpO2 98% Patient alert and oriented and in no cardiopulmonary distress.  HEENT: No facial asymmetry, EOMI,   oropharynx pink and moist.  Neck supple no JVD, no mass.  Chest: Clear to auscultation bilaterally.  CVS: S1, S2 no murmurs, no S3.Regular rate.  ABD: Soft non tender.   Ext: No edema  CNS: CN 2-12 intact, power,  normal throughout.no focal deficits noted.        Assessment & Plan:  Essential hypertension Controlled, no change in medication DASH diet and commitment to daily physical activity for a minimum of 30 minutes discussed and encouraged, as a part of hypertension management. The importance of attaining a healthy weight is also discussed.

## 2014-02-04 NOTE — Patient Instructions (Signed)
F/u in 6 month, vcll if you need me before  Please work on healthy lifestyle changes,mto improve your health  Blood pressure is good, no med changes  Fasting lipid, chem 7, CBC, tSH in 6 month

## 2014-02-08 NOTE — Assessment & Plan Note (Signed)
Controlled, no change in medication DASH diet and commitment to daily physical activity for a minimum of 30 minutes discussed and encouraged, as a part of hypertension management. The importance of attaining a healthy weight is also discussed.  

## 2014-02-24 ENCOUNTER — Other Ambulatory Visit: Payer: Self-pay

## 2014-02-24 DIAGNOSIS — A6004 Herpesviral vulvovaginitis: Secondary | ICD-10-CM

## 2014-02-24 MED ORDER — ACYCLOVIR 400 MG PO TABS: 400.0000 mg | ORAL_TABLET | Freq: Two times a day (BID) | ORAL | Status: DC

## 2014-03-10 ENCOUNTER — Other Ambulatory Visit: Payer: Self-pay | Admitting: Family Medicine

## 2014-03-13 ENCOUNTER — Other Ambulatory Visit: Payer: Self-pay

## 2014-04-04 ENCOUNTER — Other Ambulatory Visit: Payer: Self-pay | Admitting: Family Medicine

## 2014-06-12 ENCOUNTER — Other Ambulatory Visit: Payer: Self-pay

## 2014-06-12 MED ORDER — LEVONORGEST-ETH ESTRAD 91-DAY 0.15-0.03 MG PO TABS: 1.0000 | ORAL_TABLET | Freq: Every day | ORAL | Status: DC

## 2014-08-04 ENCOUNTER — Encounter: Payer: Self-pay | Admitting: Family Medicine

## 2014-08-04 ENCOUNTER — Ambulatory Visit (INDEPENDENT_AMBULATORY_CARE_PROVIDER_SITE_OTHER): Payer: Medicaid Other | Admitting: Family Medicine

## 2014-08-04 VITALS — BP 128/88 | HR 78 | Resp 16 | Ht 64.0 in | Wt 170.0 lb

## 2014-08-04 DIAGNOSIS — G4700 Insomnia, unspecified: Secondary | ICD-10-CM | POA: Diagnosis not present

## 2014-08-04 DIAGNOSIS — F411 Generalized anxiety disorder: Secondary | ICD-10-CM | POA: Diagnosis not present

## 2014-08-04 DIAGNOSIS — E785 Hyperlipidemia, unspecified: Secondary | ICD-10-CM | POA: Diagnosis not present

## 2014-08-04 DIAGNOSIS — I10 Essential (primary) hypertension: Secondary | ICD-10-CM

## 2014-08-04 DIAGNOSIS — J302 Other seasonal allergic rhinitis: Secondary | ICD-10-CM

## 2014-08-04 NOTE — Patient Instructions (Signed)
Annual physical as before  It is important that you exercise regularly at least 30 minutes7 times a week. If you develop chest pain, have severe difficulty breathing, or feel very tired, stop exercising immediately and seek medical attention   Please work on good  health habits so that your health will improve. 1. Commitment to daily physical activity for 30 to 60  minutes, if you are able to do this.  2. Commitment to wise food choices. Aim for half of your  food intake to be vegetable and fruit, one quarter starchy foods, and one quarter protein. Try to eat on a regular schedule  3 meals per day, snacking between meals should be limited to vegetables or fruits or small portions of nuts. 64 ounces of water per day is generally recommended, unless you have specific health conditions, like heart failure or kidney failure where you will need to limit fluid intake.  3. Commitment to sufficient and a  good quality of physical and mental rest daily, generally between 6 to 8 hours per day.  WITH PERSISTANCE AND PERSEVERANCE, THE IMPOSSIBLE , BECOMES THE NORM!  Thanks for choosing [facility], we consider it a privelige to serve you.

## 2014-08-09 NOTE — Assessment & Plan Note (Signed)
Currently asymptomatic, tends to be more symptomatic in spring and Fall

## 2014-08-09 NOTE — Assessment & Plan Note (Signed)
Improved with improved sleep hygiene, and also because of increased daily activities caring for her sick Mother , she goes to bed more exhausted , on no medication getting 7 hrs of uninterrupted sleep on average

## 2014-08-09 NOTE — Assessment & Plan Note (Signed)
Hyperlipidemia:Low fat diet discussed and encouraged.   Lipid Panel  Lab Results  Component Value Date   CHOL 178 08/22/2013   HDL 43 08/22/2013   LDLCALC 115* 08/22/2013   TRIG 100 08/22/2013   CHOLHDL 4.1 08/22/2013  Updated lab needed at/ before next visit. Needs to follow low fat diet

## 2014-08-09 NOTE — Assessment & Plan Note (Signed)
Deteriorated. Patient re-educated about  the importance of commitment to a  minimum of 150 minutes of exercise per week.  The importance of healthy food choices with portion control discussed. Encouraged to start a food diary, count calories and to consider  joining a support group. Sample diet sheets offered. Goals set by the patient for the next several months.   Weight /BMI 08/04/2014 02/04/2014 01/22/2014  WEIGHT 170 lb 172 lb 12.8 oz 170 lb  HEIGHT  5' 3.5" 5' 3.5"  BMI 29.17 kg/m2 30.13 kg/m2 29.64 kg/m2    Current exercise per week 60 minutes.

## 2014-08-09 NOTE — Assessment & Plan Note (Signed)
Controlled, no change in medication DASH diet and commitment to daily physical activity for a minimum of 30 minutes discussed and encouraged, as a part of hypertension management. The importance of attaining a healthy weight is also discussed.  BP/Weight 08/04/2014 02/04/2014 01/22/2014 12/24/2013 08/08/2013 05/28/2013 11/25/2012  Systolic BP 128 134 148 160 160 130 160  Diastolic BP 88 84 100 98 100 86 100  Wt. (Lbs) 170 172.8 170 171 168.4 165 169.4  BMI 29.17 30.13 29.64 29.81 29.36 28.77 29.53

## 2014-08-09 NOTE — Assessment & Plan Note (Signed)
Reduced anxiety , does not require medication at this time

## 2014-08-09 NOTE — Progress Notes (Signed)
Catherine Allen     MRN: [id number]      DOB: 11-22-1975   HPI [name] is here for follow up and re-evaluation of chronic medical conditions, medication management and review of any available recent lab and radiology data.  Preventive health is updated, specifically  Cancer screening and Immunization.   Questions or concerns regarding consultations or procedures which the PT has had in the interim are  addressed. The PT denies any adverse reactions to current medications since the last visit.  There are no new concerns.  There are no specific complaints   ROS Denies recent fever or chills. Denies sinus pressure, nasal congestion, ear pain or sore throat. Denies chest congestion, productive cough or wheezing. Denies chest pains, palpitations and leg swelling Denies abdominal pain, nausea, vomiting,diarrhea or constipation.   Denies dysuria, frequency, hesitancy or incontinence. Denies joint pain, swelling and limitation in mobility. Denies headaches, seizures, numbness, or tingling. Denies depression, anxiety or insomnia. Denies skin break down or rash.   PE  BP 128/88 mmHg  Pulse 78  Resp 16  Ht 5\' 4"  (1.626 m)  Wt 170 lb (77.111 kg)  BMI 29.17 kg/m2  SpO2 99%  Patient alert and oriented and in no cardiopulmonary distress.  HEENT: No facial asymmetry, EOMI,   oropharynx pink and moist.  Neck supple no JVD, no mass.  Chest: Clear to auscultation bilaterally.  CVS: S1, S2 no murmurs, no S3.Regular rate.  ABD: Soft non tender.   Ext: No edema  MS: Adequate ROM spine, shoulders, hips and knees.  Skin: Intact, no ulcerations or rash noted.  Psych: Good eye contact, normal affect. Memory intact not anxious or depressed appearing.  CNS: CN 2-12 intact, power,  normal throughout.no focal deficits noted.   Assessment & Plan   Essential hypertension Controlled, no change in medication DASH diet and commitment to daily physical activity for a minimum of 30  minutes discussed and encouraged, as a part of hypertension management. The importance of attaining a healthy weight is also discussed.  BP/Weight 08/04/2014 02/04/2014 01/22/2014 12/24/2013 08/08/2013 05/28/2013 11/25/2012  Systolic BP 128 134 148 160 160 130 160  Diastolic BP 88 84 100 98 100 86 100  Wt. (Lbs) 170 172.8 170 171 168.4 165 169.4  BMI 29.17 30.13 29.64 29.81 29.36 28.77 29.53        OVERWEIGHT Deteriorated. Patient re-educated about  the importance of commitment to a  minimum of 150 minutes of exercise per week.  The importance of healthy food choices with portion control discussed. Encouraged to start a food diary, count calories and to consider  joining a support group. Sample diet sheets offered. Goals set by the patient for the next several months.   Weight /BMI 08/04/2014 02/04/2014 01/22/2014  WEIGHT 170 lb 172 lb 12.8 oz 170 lb  HEIGHT 5\' 4"  5' 3.5" 5' 3.5"  BMI 29.17 kg/m2 30.13 kg/m2 29.64 kg/m2    Current exercise per week 60 minutes.   Hyperlipemia Hyperlipidemia:Low fat diet discussed and encouraged.   Lipid Panel  Lab Results  Component Value Date   CHOL 178 08/22/2013   HDL 43 08/22/2013   LDLCALC 115* 08/22/2013   TRIG 100 08/22/2013   CHOLHDL 4.1 08/22/2013  Updated lab needed at/ before next visit. Needs to follow low fat diet      Insomnia Improved with improved sleep hygiene, and also because of increased daily activities caring for her sick Mother , she goes to bed more exhausted , on  no medication getting 7 hrs of uninterrupted sleep on average  Anxiety state Reduced anxiety , does not require medication at this time  Allergic rhinitis Currently asymptomatic, tends to be more symptomatic in spring and Fall

## 2014-09-05 ENCOUNTER — Other Ambulatory Visit: Payer: Self-pay | Admitting: Family Medicine

## 2014-09-24 ENCOUNTER — Other Ambulatory Visit: Payer: Self-pay | Admitting: Family Medicine

## 2014-10-12 ENCOUNTER — Other Ambulatory Visit: Payer: Self-pay | Admitting: Family Medicine

## 2014-10-20 ENCOUNTER — Other Ambulatory Visit: Payer: Self-pay | Admitting: Family Medicine

## 2014-10-20 DIAGNOSIS — Z1231 Encounter for screening mammogram for malignant neoplasm of breast: Secondary | ICD-10-CM

## 2014-11-09 ENCOUNTER — Encounter: Payer: Self-pay | Admitting: Family Medicine

## 2014-11-09 ENCOUNTER — Ambulatory Visit (INDEPENDENT_AMBULATORY_CARE_PROVIDER_SITE_OTHER): Payer: Medicaid Other | Admitting: Family Medicine

## 2014-11-09 VITALS — BP 120/80 | HR 62 | Resp 16 | Ht 64.0 in | Wt 165.0 lb

## 2014-11-09 DIAGNOSIS — I10 Essential (primary) hypertension: Secondary | ICD-10-CM

## 2014-11-09 DIAGNOSIS — H6121 Impacted cerumen, right ear: Secondary | ICD-10-CM

## 2014-11-09 DIAGNOSIS — E785 Hyperlipidemia, unspecified: Secondary | ICD-10-CM

## 2014-11-09 DIAGNOSIS — J3089 Other allergic rhinitis: Secondary | ICD-10-CM

## 2014-11-09 NOTE — Patient Instructions (Addendum)
F/u as before, call if you need me sooner.  Right earpain is from impacted wax, use OTC wax softener twice daily for 5 days , and attempt to get rid of wax using your knuckle, there is also OTC ear flush available which you may try. If this fails you need to call to go to ENT  Reconsider flu vaccine.  Start daily use of nasonex for symptom control  Labs to be determined by nurse  BP is good

## 2014-11-16 ENCOUNTER — Ambulatory Visit (HOSPITAL_COMMUNITY)
Admission: RE | Admit: 2014-11-16 | Discharge: 2014-11-16 | Disposition: A | Discharge status: Home / Self Care | Payer: Medicaid Other | Source: Ambulatory Visit | Attending: Family Medicine | Admitting: Family Medicine

## 2014-11-16 DIAGNOSIS — Z1231 Encounter for screening mammogram for malignant neoplasm of breast: Secondary | ICD-10-CM

## 2014-11-18 ENCOUNTER — Other Ambulatory Visit: Payer: Self-pay | Admitting: Family Medicine

## 2014-11-18 DIAGNOSIS — R928 Other abnormal and inconclusive findings on diagnostic imaging of breast: Secondary | ICD-10-CM

## 2014-11-19 DIAGNOSIS — H6121 Impacted cerumen, right ear: Secondary | ICD-10-CM | POA: Insufficient documentation

## 2014-11-19 NOTE — Assessment & Plan Note (Signed)
Controlled, no change in medication DASH diet and commitment to daily physical activity for a minimum of 30 minutes discussed and encouraged, as a part of hypertension management. The importance of attaining a healthy weight is also discussed.  BP/Weight 11/09/2014 08/04/2014 02/04/2014 01/22/2014 12/24/2013 08/08/2013 05/28/2013  Systolic BP 120 128 134 148 160 160 130  Diastolic BP 80 88 84 100 98 100 86  Wt. (Lbs) 165 170 172.8 170 171 168.4 165  BMI 28.31 29.17 30.13 29.64 29.81 29.36 28.77

## 2014-11-19 NOTE — Assessment & Plan Note (Signed)
Controlled, no change in medication  

## 2014-11-19 NOTE — Progress Notes (Signed)
   Subjective:    [name], female    DOB: 11/18/1975, 39 y.o.   MRN: [id number]  HPI   Catherine Allen     MRN: [id number]      DOB: 05/26/1975   HPI Catherine Allen is here with c/o right ear pain and reduced hearing, no trauma, has had impaction in the past  Preventive health is updated, specifically  Cancer screening and Immunization.   Questions or concerns regarding consultations or procedures which the PT has had in the interim are  addressed. The PT denies any adverse reactions to current medications since the last visit.     ROS Denies recent fever or chills. Denies sinus pressure, nasal congestion,  or sore throat. Denies chest congestion, productive cough or wheezing. Denies chest pains, palpitations and leg swelling Denies abdominal pain, nausea, vomiting,diarrhea or constipation.   Denies dysuria, frequency, hesitancy or incontinence. Denies joint pain, swelling and limitation in mobility. Denies headaches, seizures, numbness, or tingling. Denies depression, anxiety or insomnia. Denies skin break down or rash.   PE  BP 120/80 mmHg  Pulse 62  Resp 16  Ht 5\' 4"  (1.626 m)  Wt 165 lb (74.844 kg)  BMI 28.31 kg/m2  SpO2 100%  Patient alert and oriented and in no cardiopulmonary distress.  HEENT: No facial asymmetry, EOMI,   oropharynx pink and moist.  Neck supple no JVD, no mass. Left TM occluded by impacted cerumen, right TM clear Chest: Clear to auscultation bilaterally.  CVS: S1, S2 no murmurs, no S3.Regular rate.  ABD: Soft non tender.   Ext: No edema  MS: Adequate ROM spine, shoulders, hips and knees.  Skin: Intact, no ulcerations or rash noted.  Psych: Good eye contact, normal affect. Memory intact not anxious or depressed appearing.  CNS: CN 2-12 intact, power,  normal throughout.no focal deficits noted.   Assessment & Plan   Cerumen impaction Pt to use wax softner and ear flush at home , if unsuccessful, she wil call back  in next week and will be referred to ENT  Essential hypertension Controlled, no change in medication DASH diet and commitment to daily physical activity for a minimum of 30 minutes discussed and encouraged, as a part of hypertension management. The importance of attaining a healthy weight is also discussed.  BP/Weight 11/09/2014 08/04/2014 02/04/2014 01/22/2014 12/24/2013 08/08/2013 05/28/2013  Systolic BP 120 128 134 148 160 160 130  Diastolic BP 80 88 84 100 98 100 86  Wt. (Lbs) 165 170 172.8 170 171 168.4 165  BMI 28.31 29.17 30.13 29.64 29.81 29.36 28.77        Allergic rhinitis Controlled, no change in medication        Review of Systems     Objective:   Physical Exam        Assessment & Plan:

## 2014-11-19 NOTE — Assessment & Plan Note (Signed)
Pt to use wax softner and ear flush at home , if unsuccessful, she wil call back in next week and will be referred to ENT

## 2014-12-01 ENCOUNTER — Other Ambulatory Visit: Payer: Self-pay | Admitting: Family Medicine

## 2014-12-01 ENCOUNTER — Encounter (HOSPITAL_COMMUNITY): Payer: Medicaid Other

## 2014-12-01 ENCOUNTER — Ambulatory Visit (HOSPITAL_COMMUNITY)
Admission: RE | Admit: 2014-12-01 | Discharge: 2014-12-01 | Disposition: A | Discharge status: Home / Self Care | Payer: Medicaid Other | Source: Ambulatory Visit | Attending: Family Medicine | Admitting: Family Medicine

## 2014-12-01 DIAGNOSIS — R921 Mammographic calcification found on diagnostic imaging of breast: Secondary | ICD-10-CM | POA: Diagnosis present

## 2014-12-01 DIAGNOSIS — R928 Other abnormal and inconclusive findings on diagnostic imaging of breast: Secondary | ICD-10-CM

## 2014-12-16 ENCOUNTER — Encounter: Payer: Medicaid Other | Admitting: Family Medicine

## 2015-02-09 LAB — LIPID PANEL
Cholesterol: 240 mg/dL — ABNORMAL HIGH (ref 125–200)
HDL: 47 mg/dL (ref >=46)
LDL Cholesterol: 176 mg/dL — ABNORMAL HIGH (ref <130)
TRIGLYCERIDES: 84 mg/dL (ref <150)
Total CHOL/HDL Ratio: 5.1 Ratio — ABNORMAL HIGH (ref <=5.0)
VLDL: 17 mg/dL (ref <30)

## 2015-02-09 LAB — BASIC METABOLIC PANEL
BUN: 9 mg/dL (ref 7–25)
CHLORIDE: 105 mmol/L (ref 98–110)
CO2: 25 mmol/L (ref 20–31)
CREATININE: 0.97 mg/dL (ref 0.50–1.10)
Calcium: 8.6 mg/dL (ref 8.6–10.2)
Glucose, Bld: 90 mg/dL (ref 65–99)
Potassium: 3.5 mmol/L (ref 3.5–5.3)
Sodium: 138 mmol/L (ref 135–146)

## 2015-02-09 LAB — TSH: TSH: 2.313 u[IU]/mL (ref 0.350–4.500)

## 2015-02-11 ENCOUNTER — Ambulatory Visit (INDEPENDENT_AMBULATORY_CARE_PROVIDER_SITE_OTHER): Payer: Medicaid Other | Admitting: Family Medicine

## 2015-02-11 ENCOUNTER — Other Ambulatory Visit (HOSPITAL_COMMUNITY)
Admission: RE | Admit: 2015-02-11 | Discharge: 2015-02-11 | Disposition: A | Discharge status: Home / Self Care | Payer: Medicaid Other | Source: Ambulatory Visit | Attending: Family Medicine | Admitting: Family Medicine

## 2015-02-11 ENCOUNTER — Encounter: Payer: Self-pay | Admitting: Family Medicine

## 2015-02-11 VITALS — BP 124/82 | HR 82 | Resp 16 | Ht 64.0 in | Wt 164.0 lb

## 2015-02-11 DIAGNOSIS — E785 Hyperlipidemia, unspecified: Secondary | ICD-10-CM

## 2015-02-11 DIAGNOSIS — N760 Acute vaginitis: Secondary | ICD-10-CM

## 2015-02-11 DIAGNOSIS — Z0000 Encounter for general adult medical examination without abnormal findings: Secondary | ICD-10-CM | POA: Diagnosis not present

## 2015-02-11 DIAGNOSIS — Z1211 Encounter for screening for malignant neoplasm of colon: Secondary | ICD-10-CM

## 2015-02-11 DIAGNOSIS — Z113 Encounter for screening for infections with a predominantly sexual mode of transmission: Secondary | ICD-10-CM | POA: Insufficient documentation

## 2015-02-11 DIAGNOSIS — Z01419 Encounter for gynecological examination (general) (routine) without abnormal findings: Secondary | ICD-10-CM | POA: Insufficient documentation

## 2015-02-11 DIAGNOSIS — I10 Essential (primary) hypertension: Secondary | ICD-10-CM | POA: Diagnosis not present

## 2015-02-11 DIAGNOSIS — R928 Other abnormal and inconclusive findings on diagnostic imaging of breast: Secondary | ICD-10-CM

## 2015-02-11 DIAGNOSIS — Z124 Encounter for screening for malignant neoplasm of cervix: Secondary | ICD-10-CM

## 2015-02-11 DIAGNOSIS — A6004 Herpesviral vulvovaginitis: Secondary | ICD-10-CM

## 2015-02-11 DIAGNOSIS — E049 Nontoxic goiter, unspecified: Secondary | ICD-10-CM | POA: Insufficient documentation

## 2015-02-11 LAB — POC HEMOCCULT BLD/STL (OFFICE/1-CARD/DIAGNOSTIC): FECAL OCCULT BLD: NEGATIVE

## 2015-02-11 NOTE — Progress Notes (Signed)
   Subjective:    [name], female    DOB: 07-11-1975, 40 y.o.   MRN: [id number]  HPI Patient is in for annual physical exam. C/o increased smelly d/c for past 1 week, an burning , stinging lesion on outer lip Recent labs, if available are reviewed. Immunization is reviewed , and  updated if needed.   Review of Systems See HPI      Objective:   Physical Exam BP 124/82 mmHg  Pulse 82  Resp 16  Ht  (1.626 m)  Wt 164 lb (74.39 kg)  BMI 28.14 kg/m2  SpO2 98% Pleasant well nourished female, alert and oriented x 3, in no cardio-pulmonary distress. Afebrile. HEENT No facial trauma or asymetry. Sinuses non tender.  Extra occullar muscles intact, pupils equally reactive to light. External ears normal, tympanic membranes partially obstructed by cerumen. Neck: supple, no adenopathy,JVD or  Bruits.Patient has thyromegaly  Chest: Clear to ascultation bilaterally.No crackles or wheezes. Non tender to palpation  Breast: No asymetry,no masses or lumps. No tenderness. No nipple discharge or inversion. No axillary or supraclavicular adenopathy  Cardiovascular system; Heart sounds normal,  S1 and  S2 ,no S3.  No murmur, or thrill. Apical beat not displaced Peripheral pulses normal.  Abdomen: Soft, non tender, no organomegaly or masses. No bruits. Bowel sounds normal. No guarding, tenderness or rebound.  Rectal:  Normal sphincter tone. No mass.No rectal masses.  Guaiac negative stool.  GU: External genitalia normal female genitalia , female distribution of hair. Ulcer on labia majora left at 1 o clock position Urethral meatus normal in size, no  Prolapse, no lesions visibly  Present. Bladder non tender. Vagina pink and moist , with no visible lesions , copious  Yellow  discharge present . Adequate pelvic support no  cystocele or rectocele noted Cervix pink and appears healthy, no lesions or ulcerations noted,yellow dischargedischarge noted from  os Uterus normal size, no adnexal masses, no cervical motion or adnexal tenderness.   Musculoskeletal exam: Full ROM of spine, hips , shoulders and knees. No deformity ,swelling or crepitus noted. No muscle wasting or atrophy.   Neurologic: Cranial nerves 2 to 12 intact. Power, tone ,sensation and reflexes normal throughout. No disturbance in gait. No tremor.  Skin: Intact, no ulceration, erythema , scaling or rash noted. Pigmentation normal throughout  Psych; Normal mood and affect. Judgement and concentration normal       Assessment & Plan:  Annual physical exam Annual exam as documented. Counseling done  re healthy lifestyle involving commitment to 150 minutes exercise per week, heart healthy diet, and attaining healthy weight.The importance of adequate sleep also discussed. Regular seat belt use and home safety, is also discussed. Changes in health habits are decided on by the patient with goals and time frames  set for achieving them. Immunization and cancer screening needs are specifically addressed at this visit.   Type 2 HSV infection of vulvovaginal region Current flare with ulcer at 1 o' clock on labia majora, recommend 1 week course of acyclovir  Goiter Ultrasound of thyroid ordered to further eval  Vaginitis and vulvovaginitis Symptomatic, specimens sent for testing will treat based on result

## 2015-02-11 NOTE — Assessment & Plan Note (Signed)

## 2015-02-11 NOTE — Patient Instructions (Addendum)
F/u in 70month, call if you need jme sooner  You are referred for ultrasound of thyroid gland  Reduce fat in diet , cholesterol is too high  Need  Diagnostic mammogram in May  Fasting CBC, lipid, chem 7 in 5. Month  You will be contacted with lab result  Take acyclovir 400 mg  one tablet 3 times daily for 1 week  Thanks for choosing [facility], we consider it a privelige to serve you.

## 2015-02-12 LAB — CERVICOVAGINAL ANCILLARY ONLY: WET PREP (BD AFFIRM): NEGATIVE

## 2015-02-14 DIAGNOSIS — N760 Acute vaginitis: Secondary | ICD-10-CM | POA: Insufficient documentation

## 2015-02-14 NOTE — Assessment & Plan Note (Signed)
Current flare with ulcer at 1 o' clock on labia majora, recommend 1 week course of acyclovir

## 2015-02-14 NOTE — Assessment & Plan Note (Signed)
Symptomatic, specimens sent for testing will treat based on result

## 2015-02-14 NOTE — Assessment & Plan Note (Signed)
Ultrasound of thyroid ordered to further eval

## 2015-02-15 LAB — CYTOLOGY - PAP

## 2015-02-25 ENCOUNTER — Other Ambulatory Visit: Payer: Self-pay | Admitting: Family Medicine

## 2015-03-01 ENCOUNTER — Ambulatory Visit (HOSPITAL_COMMUNITY)
Admission: RE | Admit: 2015-03-01 | Discharge: 2015-03-01 | Disposition: A | Discharge status: Home / Self Care | Payer: Medicaid Other | Source: Ambulatory Visit | Attending: Family Medicine | Admitting: Family Medicine

## 2015-03-01 DIAGNOSIS — R591 Generalized enlarged lymph nodes: Secondary | ICD-10-CM | POA: Insufficient documentation

## 2015-03-01 DIAGNOSIS — E049 Nontoxic goiter, unspecified: Secondary | ICD-10-CM

## 2015-03-07 ENCOUNTER — Other Ambulatory Visit: Payer: Self-pay | Admitting: Family Medicine

## 2015-04-20 ENCOUNTER — Other Ambulatory Visit: Payer: Self-pay | Admitting: Family Medicine

## 2015-05-27 ENCOUNTER — Telehealth: Payer: Self-pay | Admitting: Family Medicine

## 2015-05-27 ENCOUNTER — Other Ambulatory Visit: Payer: Self-pay | Admitting: Family Medicine

## 2015-05-27 MED ORDER — MONTELUKAST SODIUM 10 MG PO TABS: 10.0000 mg | ORAL_TABLET | Freq: Every day | ORAL | Status: DC

## 2015-05-27 NOTE — Telephone Encounter (Signed)
Medication sent in, pls let her know

## 2015-05-27 NOTE — Telephone Encounter (Signed)
Nose is very congested when she blows nothing comes out, she does feel drainage in the back of her throat, using the nasanex but it hasnt helped, symptoms x's 2 days, please advise?

## 2015-05-27 NOTE — Telephone Encounter (Signed)
Patient aware also advised use of netty pot..Marland Kitchen

## 2015-05-31 ENCOUNTER — Emergency Department (HOSPITAL_COMMUNITY)
Admission: EM | Admit: 2015-05-31 | Discharge: 2015-05-31 | Disposition: A | Discharge status: Home / Self Care | Payer: Medicaid Other | Attending: Emergency Medicine | Admitting: Emergency Medicine

## 2015-05-31 ENCOUNTER — Encounter (HOSPITAL_COMMUNITY): Payer: Self-pay | Admitting: Emergency Medicine

## 2015-05-31 DIAGNOSIS — Z79899 Other long term (current) drug therapy: Secondary | ICD-10-CM | POA: Diagnosis not present

## 2015-05-31 DIAGNOSIS — R0981 Nasal congestion: Secondary | ICD-10-CM | POA: Diagnosis not present

## 2015-05-31 DIAGNOSIS — I10 Essential (primary) hypertension: Secondary | ICD-10-CM | POA: Insufficient documentation

## 2015-05-31 DIAGNOSIS — R059 Cough, unspecified: Secondary | ICD-10-CM

## 2015-05-31 DIAGNOSIS — F419 Anxiety disorder, unspecified: Secondary | ICD-10-CM | POA: Diagnosis present

## 2015-05-31 DIAGNOSIS — F410 Panic disorder [episodic paroxysmal anxiety] without agoraphobia: Secondary | ICD-10-CM

## 2015-05-31 DIAGNOSIS — R05 Cough: Secondary | ICD-10-CM | POA: Diagnosis not present

## 2015-05-31 DIAGNOSIS — D72829 Elevated white blood cell count, unspecified: Secondary | ICD-10-CM

## 2015-05-31 LAB — CBC WITH DIFFERENTIAL/PLATELET
Basophils Absolute: 0 10*3/uL (ref 0.0–0.1)
Basophils Relative: 0 %
Eosinophils Absolute: 0 10*3/uL (ref 0.0–0.7)
Eosinophils Relative: 0 %
HEMATOCRIT: 39.4 % (ref 36.0–46.0)
Hemoglobin: 12.8 g/dL (ref 12.0–15.0)
LYMPHS PCT: 19 %
Lymphs Abs: 3.9 10*3/uL (ref 0.7–4.0)
MCH: 27.5 pg (ref 26.0–34.0)
MCHC: 32.5 g/dL (ref 30.0–36.0)
MCV: 84.5 fL (ref 78.0–100.0)
MONOS PCT: 4 %
Monocytes Absolute: 0.8 10*3/uL (ref 0.1–1.0)
Neutro Abs: 15.8 10*3/uL — ABNORMAL HIGH (ref 1.7–7.7)
Neutrophils Relative %: 77 %
Platelets: 401 10*3/uL — ABNORMAL HIGH (ref 150–400)
RBC: 4.66 MIL/uL (ref 3.87–5.11)
RDW: 12.7 % (ref 11.5–15.5)
WBC: 20.5 10*3/uL — AB (ref 4.0–10.5)

## 2015-05-31 LAB — BASIC METABOLIC PANEL
Anion gap: 8 (ref 5–15)
BUN: 10 mg/dL (ref 6–20)
CHLORIDE: 104 mmol/L (ref 101–111)
CO2: 25 mmol/L (ref 22–32)
Calcium: 8.6 mg/dL — ABNORMAL LOW (ref 8.9–10.3)
Creatinine, Ser: 0.7 mg/dL (ref 0.44–1.00)
GFR calc Af Amer: >60 mL/min (ref >60)
GFR calc non Af Amer: >60 mL/min (ref >60)
GLUCOSE: 92 mg/dL (ref 65–99)
POTASSIUM: 3.6 mmol/L (ref 3.5–5.1)
Sodium: 137 mmol/L (ref 135–145)

## 2015-05-31 MED ORDER — BENZONATATE 100 MG PO CAPS: 100.0000 mg | ORAL_CAPSULE | Freq: Three times a day (TID) | ORAL | Status: DC

## 2015-05-31 MED ORDER — AZITHROMYCIN 250 MG PO TABS: ORAL_TABLET | ORAL | Status: DC

## 2015-05-31 MED ORDER — HYDROXYZINE HCL 25 MG PO TABS: 25.0000 mg | ORAL_TABLET | Freq: Four times a day (QID) | ORAL | Status: DC | PRN

## 2015-05-31 NOTE — Discharge Instructions (Signed)
Cough, Adult Coughing is a reflex that clears your throat and your airways. Coughing helps to heal and protect your lungs. It is normal to cough occasionally, but a cough that happens with other symptoms or lasts a long time may be a sign of a condition that needs treatment. A cough may last only 2-3 weeks (acute), or it may last longer than 8 weeks (chronic). CAUSES Coughing is commonly caused by:  Breathing in substances that irritate your lungs.  A viral or bacterial respiratory infection.  Allergies.  Asthma.  Postnasal drip.  Smoking.  Acid backing up from the stomach into the esophagus (gastroesophageal reflux).  Certain medicines.  Chronic lung problems, including COPD (or rarely, lung cancer).  Other medical conditions such as heart failure. HOME CARE INSTRUCTIONS  Pay attention to any changes in your symptoms. Take these actions to help with your discomfort:  Take medicines only as told by your health care provider.  If you were prescribed an antibiotic medicine, take it as told by your health care provider. Do not stop taking the antibiotic even if you start to feel better.  Talk with your health care provider before you take a cough suppressant medicine.  Drink enough fluid to keep your urine clear or pale yellow.  If the air is dry, use a cold steam vaporizer or humidifier in your bedroom or your home to help loosen secretions.  Avoid anything that causes you to cough at work or at home.  If your cough is worse at night, try sleeping in a semi-upright position.  Avoid cigarette smoke. If you smoke, quit smoking. If you need help quitting, ask your health care provider.  Avoid caffeine.  Avoid alcohol.  Rest as needed. SEEK MEDICAL CARE IF:   You have new symptoms.  You cough up pus.  Your cough does not get better after 2-3 weeks, or your cough gets worse.  You cannot control your cough with suppressant medicines and you are losing sleep.  You  develop pain that is getting worse or pain that is not controlled with pain medicines.  You have a fever.  You have unexplained weight loss.  You have night sweats. SEEK IMMEDIATE MEDICAL CARE IF:  You cough up blood.  You have difficulty breathing.  Your heartbeat is very fast.   This information is not intended to replace advice given to you by your health care provider. Make sure you discuss any questions you have with your health care provider.   Document Released: 06/24/2010 Document Revised: 09/16/2014 Document Reviewed: 03/04/2014 Elsevier Interactive Patient Education 2016 [employer]  Leukocytosis Leukocytosis means you have more white blood cells than normal. White blood cells are made in your bone marrow. The main job of white blood cells is to fight infection. Having too many white blood cells is a common condition. It can develop as a result of many types of medical problems. CAUSES  In some cases, your bone marrow may be normal, but it is still making too many white blood cells. This could be the result of:  Infection.  Injury.  Physical stress.  Emotional stress.  Surgery.  Allergic reactions.  Tumors that do not start in the blood or bone marrow.  An inherited disease.  Certain medicines.  Pregnancy and labor. In other cases, you may have a bone marrow disorder that is causing your body to make too many white blood cells. Bone marrow disorders include:  Leukemia. This is a type of blood cancer.  Myeloproliferative  disorders. These disorders cause blood cells to grow abnormally. SYMPTOMS  Some people have no symptoms. Others have symptoms due to the medical problem that is causing their leukocytosis. These symptoms may include:  Bleeding.  Bruising.  Fever.  Night sweats.  Repeated infections.  Weakness.  Weight loss. DIAGNOSIS  Leukocytosis is often found during blood tests that are done as part of a normal physical exam. Your  caregiver will probably order other tests to help determine why you have too many white blood cells. These tests may include:  A complete blood count (CBC). This test measures all the types of blood cells in your body.  Chest X-rays, urine tests (urinalysis), or other tests to look for signs of infection.  Bone marrow aspiration. For this test, a needle is put into your bone. Cells from the bone marrow are removed through the needle. The cells are then examined under a microscope. TREATMENT  Treatment is usually not needed for leukocytosis. However, if a disorder is causing your leukocytosis, it will need to be treated. Treatment may include:  Antibiotic medicines if you have a bacterial infection.  Bone marrow transplant. Your diseased bone marrow is replaced with healthy cells that will grow new bone marrow.  Chemotherapy. This is the use of drugs to kill cancer cells. HOME CARE INSTRUCTIONS  Only take over-the-counter or prescription medicines as directed by your caregiver.  Maintain a healthy weight. Ask your caregiver what weight is best for you.  Eat foods that are low in saturated fats and high in fiber. Eat plenty of fruits and vegetables.  Drink enough fluids to keep your urine clear or pale yellow.  Get 30 minutes of exercise at least 5 times a week. Check with your caregiver before starting a new exercise routine.  Limit caffeine and alcohol.  Do not smoke.  Keep all follow-up appointments as directed by your caregiver. SEEK MEDICAL CARE IF:  You feel weak or more tired than usual.  You develop chills, a cough, or nasal congestion.  You lose weight without trying.  You have night sweats.  You bruise easily. SEEK IMMEDIATE MEDICAL CARE IF:  You bleed more than normal.  You have chest pain.  You have trouble breathing.  You have a fever.  You have uncontrolled nausea or vomiting.  You feel dizzy or lightheaded. MAKE SURE YOU:  Understand these  instructions.  Will watch your condition.  Will get help right away if you are not doing well or get worse.   This information is not intended to replace advice given to you by your health care provider. Make sure you discuss any questions you have with your health care provider.   Document Released: 12/15/2010 Document Revised: 03/20/2011 Document Reviewed: 06/29/2014 Elsevier Interactive Patient Education 2016 [employer] Inc.  Panic Attacks Panic attacks are sudden, short-livedsurges of severe anxiety, fear, or discomfort. They may occur for no reason when you are relaxed, when you are anxious, or when you are sleeping. Panic attacks may occur for a number of reasons:   Healthy people occasionally have panic attacks in extreme, life-threatening situations, such as war or natural disasters. Normal anxiety is a protective mechanism of the body that helps [place] react to danger (fight or flight response).  Panic attacks are often seen with anxiety disorders, such as panic disorder, social anxiety disorder, generalized anxiety disorder, and phobias. Anxiety disorders cause excessive or uncontrollable anxiety. They may interfere with your relationships or other life activities.  Panic attacks are sometimes seen  with other mental illnesses, such as depression and posttraumatic stress disorder.  Certain medical conditions, prescription medicines, and drugs of abuse can cause panic attacks. SYMPTOMS  Panic attacks start suddenly, peak within 20 minutes, and are accompanied by four or more of the following symptoms:  Pounding heart or fast heart rate (palpitations).  Sweating.  Trembling or shaking.  Shortness of breath or feeling smothered.  Feeling choked.  Chest pain or discomfort.  Nausea or strange feeling in your stomach.  Dizziness, light-headedness, or feeling like you will faint.  Chills or hot flushes.  Numbness or tingling in your lips or hands and feet.  Feeling that  things are not real or feeling that you are not yourself.  Fear of losing control or going crazy.  Fear of dying. Some of these symptoms can mimic serious medical conditions. For example, you may think you are having a heart attack. Although panic attacks can be very scary, they are not life threatening. DIAGNOSIS  Panic attacks are diagnosed through an assessment by your health care provider. Your health care provider will ask questions about your symptoms, such as where and when they occurred. Your health care provider will also ask about your medical history and use of alcohol and drugs, including prescription medicines. Your health care provider may order blood tests or other studies to rule out a serious medical condition. Your health care provider may refer you to a mental health professional for further evaluation. TREATMENT   Most healthy people who have one or two panic attacks in an extreme, life-threatening situation will not require treatment.  The treatment for panic attacks associated with anxiety disorders or other mental illness typically involves counseling with a mental health professional, medicine, or a combination of both. Your health care provider will help determine what treatment is best for you.  Panic attacks due to physical illness usually go away with treatment of the illness. If prescription medicine is causing panic attacks, talk with your health care provider about stopping the medicine, decreasing the dose, or substituting another medicine.  Panic attacks due to alcohol or drug abuse go away with abstinence. Some adults need professional help in order to stop drinking or using drugs. HOME CARE INSTRUCTIONS   Take all medicines as directed by your health care provider.   Schedule and attend follow-up visits as directed by your health care provider. It is important to keep all your appointments. SEEK MEDICAL CARE IF:  You are not able to take your medicines as  prescribed.  Your symptoms do not improve or get worse. SEEK IMMEDIATE MEDICAL CARE IF:   You experience panic attack symptoms that are different than your usual symptoms.  You have serious thoughts about hurting yourself or others.  You are taking medicine for panic attacks and have a serious side effect. MAKE SURE YOU:  Understand these instructions.  Will watch your condition.  Will get help right away if you are not doing well or get worse.   This information is not intended to replace advice given to you by your health care provider. Make sure you discuss any questions you have with your health care provider.   Document Released: 12/26/2004 Document Revised: 12/31/2012 Document Reviewed: 08/09/2012 Elsevier Interactive Patient Education [employer]

## 2015-05-31 NOTE — ED Provider Notes (Signed)
CSN: [id number]     Arrival date & time 05/31/15  [id number] History  By signing my name below, I, Tanda RockersMargaux Venter, attest that this documentation has been prepared under the direction and in the presence of Rolland PorterMark Nayanna Seaborn, MD. Electronically Signed: Tanda RockersMargaux Venter, ED Scribe. 05/31/2015. 11:46 AM.   Chief Complaint  Patient presents with  . Anxiety   The history is provided by the patient. No language interpreter was used.    HPI Comments: Catherine Allen is a 40 y.o. female with PMHx anxiety, who presents to the Emergency Department complaining of a sudden onset "hot tingling" feeling from her shoulders down to her feet posteriorly. She reports that after the tingling feeling she had a "funny taste" in her mouth and shaking all over. Pt is unable to describe the funny taste in mouth any further. She also complains of palpitations. She has never had these symptoms in the past. Has never had a panic attack or anxiety attack. Pt states that she feels better now but still mildly shaky. Pt has had nasal congestion and cold like symptoms for the past week. She has been taking Sudafed (last taken last night), Robitussin DM, Delsym (last taken at 5 AM this morning) for her cold. Pt has not been sleeping well for the past week due to her cold symptoms. No elicit drug use. Denies any other associated symptoms. Pt has hx of HTN and takes Diaxide and Amlodipine. No risk of pregnancy. No hx anemia.    Past Medical History  Diagnosis Date  . Hemorrhoids   . Dyspareunia   . Vaginitis   . MRSA (methicillin resistant Staphylococcus aureus) 2010    boils thighs  . HTN (hypertension)   . S/P endoscopy June 2012    Dr. Jena Gaussourk: mild erosive esophagitis, chronic duodenitis, mild gastritis, stop NSAIDs.   . Allergy    Past Surgical History  Procedure Laterality Date  . Cesarean section  2004   Family History  Problem Relation Age of Onset  . Hypertension Mother   . Asthma Sister    Social History  Substance Use  Topics  . Smoking status: Never Smoker   . Smokeless tobacco: None  . Alcohol Use: No   OB History    No data available     Review of Systems  Constitutional: Negative for fever, chills, diaphoresis, appetite change and fatigue.  HENT: Positive for congestion. Negative for mouth sores, sore throat and trouble swallowing.   Eyes: Negative for visual disturbance.  Respiratory: Positive for cough. Negative for chest tightness, shortness of breath and wheezing.   Cardiovascular: Positive for palpitations. Negative for chest pain.  Gastrointestinal: Negative for nausea, vomiting, abdominal pain, diarrhea and abdominal distention.  Endocrine: Negative for polydipsia, polyphagia and polyuria.  Genitourinary: Negative for dysuria, frequency and hematuria.  Musculoskeletal: Negative for gait problem.  Skin: Negative for color change, pallor and rash.  Neurological: Positive for tremors. Negative for dizziness, syncope, light-headedness and headaches.  Hematological: Does not bruise/bleed easily.  Psychiatric/Behavioral: Positive for sleep disturbance. Negative for behavioral problems and confusion.   Allergies  Review of patient's allergies indicates no known allergies.  Home Medications   Prior to Admission medications   Medication Sig Start Date End Date Taking? Authorizing Provider  amLODipine (NORVASC) 5 MG tablet TAKE 1 TABLET BY MOUTH EVERY DAY 04/22/15  Yes Kerri PerchesMargaret E Simpson, MD  guaiFENesin-dextromethorphan (ROBITUSSIN DM) 100-10 MG/5ML syrup Take 10 mLs by mouth every 4 (four) hours as needed for cough.   Yes  Historical Provider, MD  mometasone (NASONEX) 50 MCG/ACT nasal spray Place 2 sprays into the nose daily. 01/22/14  Yes Kerri Perches, MD  montelukast (SINGULAIR) 10 MG tablet Take 1 tablet (10 mg total) by mouth at bedtime. 05/27/15  Yes Kerri Perches, MD  pseudoephedrine (SUDAFED) 30 MG tablet Take 30 mg by mouth every 6 (six) hours as needed for congestion.   Yes  Historical Provider, MD  QUASENSE 0.15-0.03 MG tablet TAKE 1 TABLET BY MOUTH DAILY 03/08/15  Yes Kerri Perches, MD  triamterene-hydrochlorothiazide (MAXZIDE) 75-50 MG tablet TAKE 1 TABLET BY MOUTH DAILY 11/19/14  Yes Kerri Perches, MD  azithromycin (ZITHROMAX Z-PAK) 250 MG tablet 6 tabs over 5 days as directed 05/31/15   Rolland Porter, MD  benzonatate (TESSALON) 100 MG capsule Take 1 capsule (100 mg total) by mouth every 8 (eight) hours. 05/31/15   Rolland Porter, MD  hydrOXYzine (ATARAX/VISTARIL) 25 MG tablet Take 1 tablet (25 mg total) by mouth every 6 (six) hours as needed for anxiety. 05/31/15   Rolland Porter, MD   BP 165/97 mmHg  Pulse 116  Temp(Src) 99.2 F (37.3 C)  Resp 22  Ht  (1.6 m)  Wt 170 lb (77.111 kg)  BMI 30.12 kg/m2  SpO2 99%   Physical Exam  Constitutional: She is oriented to person, place, and time. She appears well-developed and well-nourished. No distress.  HENT:  Head: Normocephalic.  Eyes: Conjunctivae are normal. Pupils are equal, round, and reactive to light. No scleral icterus.  Neck: Normal range of motion. Neck supple. No thyromegaly present.  Cardiovascular: Normal rate and regular rhythm.  Exam reveals no gallop and no friction rub.   No murmur heard. Pulmonary/Chest: Effort normal and breath sounds normal. No respiratory distress. She has no wheezes. She has no rales.  Abdominal: Soft. Bowel sounds are normal. She exhibits no distension. There is no tenderness. There is no rebound.  Musculoskeletal: Normal range of motion.  Neurological: She is alert and oriented to person, place, and time.  Fine resting tremor with bilateral hands  Skin: Skin is warm and dry. No rash noted. No pallor.  Psychiatric: She has a normal mood and affect. Her behavior is normal.    ED Course  Procedures (including critical care time)  DIAGNOSTIC STUDIES: Oxygen Saturation is 99% on RA, normal by my interpretation.    COORDINATION OF CARE: 11:46 AM-Discussed treatment  plan with pt at bedside and pt agreed to plan.   Labs Review Labs Reviewed  CBC WITH DIFFERENTIAL/PLATELET - Abnormal; Notable for the following:    WBC 20.5 (*)    Platelets 401 (*)    Neutro Abs 15.8 (*)    All other components within normal limits  BASIC METABOLIC PANEL - Abnormal; Notable for the following:    Calcium 8.6 (*)    All other components within normal limits    Imaging Review No results found. I have personally reviewed and evaluated these lab results as part of my medical decision-making.   EKG Interpretation None      MDM   Final diagnoses:  Cough  Leukocytosis  Panic attack   Marked leukocytosis. Normal differential without bandemia. Afebrile. Normal sats. Clear lungs. Has clear upper rest for infection symptoms both leukocytosis was Zithromax and Tessalon. Atarax as needed for any additional panic attacks.  I personally performed the services described in this documentation, which was scribed in my presence. The recorded information has been reviewed and is accurate.  Rolland Porter, MD 05/31/15 [phone]

## 2015-05-31 NOTE — ED Notes (Signed)
Pt c/o feeling lightheaded, burning tingling in back, elevated heart rate and shaking all over. Pt states she has history of anxiety.

## 2015-05-31 NOTE — ED Notes (Signed)
MD at bedside. 

## 2015-06-01 ENCOUNTER — Encounter (HOSPITAL_COMMUNITY): Payer: Medicaid Other

## 2015-06-02 ENCOUNTER — Ambulatory Visit (INDEPENDENT_AMBULATORY_CARE_PROVIDER_SITE_OTHER): Payer: Medicaid Other | Admitting: Family Medicine

## 2015-06-02 ENCOUNTER — Encounter: Payer: Self-pay | Admitting: Family Medicine

## 2015-06-02 VITALS — BP 140/86 | HR 90 | Resp 18 | Ht 64.0 in | Wt 164.0 lb

## 2015-06-02 DIAGNOSIS — Z09 Encounter for follow-up examination after completed treatment for conditions other than malignant neoplasm: Secondary | ICD-10-CM | POA: Diagnosis not present

## 2015-06-02 DIAGNOSIS — E790 Hyperuricemia without signs of inflammatory arthritis and tophaceous disease: Secondary | ICD-10-CM

## 2015-06-02 DIAGNOSIS — I10 Essential (primary) hypertension: Secondary | ICD-10-CM | POA: Diagnosis not present

## 2015-06-02 DIAGNOSIS — J3089 Other allergic rhinitis: Secondary | ICD-10-CM | POA: Diagnosis not present

## 2015-06-02 DIAGNOSIS — F411 Generalized anxiety disorder: Secondary | ICD-10-CM

## 2015-06-02 MED ORDER — PREDNISONE 5 MG (21) PO TBPK: 5.0000 mg | ORAL_TABLET | ORAL | Status: DC

## 2015-06-02 MED ORDER — PROMETHAZINE-DM 6.25-15 MG/5ML PO SYRP: ORAL_SOLUTION | ORAL | Status: DC

## 2015-06-02 MED ORDER — METHYLPREDNISOLONE ACETATE 80 MG/ML IJ SUSP
80.0000 mg | Freq: Once | INTRAMUSCULAR | Status: AC
  Administered 2015-06-02: 80 mg via INTRAMUSCULAR

## 2015-06-02 NOTE — Progress Notes (Signed)
   Subjective:    [name], female    DOB: 09/09/1975, 40 y.o.   MRN: [id number]  HPI Cough, chest congestion and loss of voice x 3 days.States since last week she had head congestion and cough, took sudafed, up to 4 tabs per day, became increasingly anxious and when seen in the Ed was noted to be hypertensive and very anxious. Started on hydroxyzine for anxiety and also a z pack In today for f/u with excess cough and loss of voice, denies recent fever or chills   Review of Systems See HPI zing. Denies chest pains, palpitations and leg swelling Denies abdominal pain, nausea, vomiting,diarrhea or constipation.   Denies dysuria, frequency, hesitancy or incontinence. Denies joint pain, swelling and limitation in mobility. Denies headaches, seizures, numbness, or tingling. Denies depression, anxiety or insomnia. Denies skin break down or rash.         Objective:   Physical Exam  BP 140/86 mmHg  Pulse 90  Resp 18  Ht 5\' 4"  (1.626 m)  Wt 164 lb (74.39 kg)  BMI 28.14 kg/m2  SpO2 98% Patient alert and oriented and in no cardiopulmonary distress.  HEENT: No facial asymmetry, EOMI,   oropharynx pink and moist.  Neck supple no JVD, no mass. Erythema and edema of nasal mucosa, clear drainage, TM clear Chest: Clear to auscultation bilaterally.  CVS: S1, S2 no murmurs, no S3.Regular rate.  ABD: Soft non tender.   Ext: No edema  MS: Adequate ROM spine, shoulders, hips and knees.  Skin: Intact, no ulcerations or rash noted.  Psych: Good eye contact, normal affect. Memory intact not anxious or depressed appearing.  CNS: CN 2-12 intact, power,  normal throughout.no focal deficits noted.       Assessment & Plan:  Encounter for examination following treatment at hospital Evaluated 2 daYS PRIO, still c/o excess cough, worse at night and loss of voice, no fever or chills, scant sputum. Educated and symptomatic treatment   Allergic rhinitis Uncontrolled,  depo medrol in office and phenergan Dm as cough suppressant for bed time use as needed  Essential hypertension Controlled, no change in medication Initial rise because of excess sudafed DASH diet and commitment to daily physical activity for a minimum of 30 minutes discussed and encouraged, as a part of hypertension management. The importance of attaining a healthy weight is also discussed.  BP/Weight 06/02/2015 05/31/2015 02/11/2015 11/09/2014 08/04/2014 02/04/2014 01/22/2014  Systolic BP 140 140 124 120 128 134 148  Diastolic BP 86 95 82 80 88 84 [id number]  Wt. (Lbs) 164 170 164 165 170 172.8 170  BMI 28.14 30.12 28.14 28.31 29.17 30.13 29.64        Anxiety state Improved, initially assesed in the ED as having a panic attack, and started on hydroxyzine

## 2015-06-02 NOTE — Patient Instructions (Signed)
F/u in July as before, call if you need me before  You are treated for uncontrolled allergies and laryngitis and bronchitis  Depomedrol in office today  Good blood pressure  Call if cough syrup not covered  Hay Fever Hay fever is an allergic reaction to particles in the air. It cannot be passed from person to person. It cannot be cured, but it can be controlled. CAUSES  Hay fever is caused by something that triggers an allergic reaction (allergens). The following are examples of allergens:  Ragweed.  Feathers.  Animal dander.  Grass and tree pollens.  Cigarette smoke.  House dust.  Pollution. SYMPTOMS   Sneezing.  Runny or stuffy nose.  Tearing eyes.  Itchy eyes, nose, mouth, throat, skin, or other area.  Sore throat.  Headache.  Decreased sense of smell or taste. DIAGNOSIS Your caregiver will perform a physical exam and ask questions about the symptoms you are having.Allergy testing may be done to determine exactly what triggers your hay fever.  TREATMENT   Over-the-counter medicines may help symptoms. These include:  Antihistamines.  Decongestants. These may help with nasal congestion.  Your caregiver may prescribe medicines if over-the-counter medicines do not work.  Some people benefit from allergy shots when other medicines are not helpful. HOME CARE INSTRUCTIONS   Avoid the allergen that is causing your symptoms, if possible.  Take all medicine as told by your caregiver. SEEK MEDICAL CARE IF:   You have severe allergy symptoms and your current medicines are not helping.  Your treatment was working at one time, but you are now experiencing symptoms.  You have sinus congestion and pressure.  You develop a fever or headache.  You have thick nasal discharge.  You have asthma and have a worsening cough and wheezing. SEEK IMMEDIATE MEDICAL CARE IF:   You have swelling of your tongue or lips.  You have trouble breathing.  You feel  lightheaded or like you are going to faint.  You have cold sweats.  You have a fever.   This information is not intended to replace advice given to you by your health care provider. Make sure you discuss any questions you have with your health care provider.   Document Released: 12/26/2004 Document Revised: 03/20/2011 Document Reviewed: 07/08/2014 Elsevier Interactive Patient Education [employer]2016 Elsevier Inc.

## 2015-06-03 ENCOUNTER — Telehealth: Payer: Self-pay | Admitting: Family Medicine

## 2015-06-03 DIAGNOSIS — R928 Other abnormal and inconclusive findings on diagnostic imaging of breast: Secondary | ICD-10-CM

## 2015-06-03 NOTE — Telephone Encounter (Signed)
Patient is calling stating that Radiology told her that we needed to put the correct Diag Mammo Order in for her and schedule, It needs to say Tomo in the order, please advise?

## 2015-06-07 DIAGNOSIS — Z09 Encounter for follow-up examination after completed treatment for conditions other than malignant neoplasm: Secondary | ICD-10-CM | POA: Insufficient documentation

## 2015-06-07 NOTE — Assessment & Plan Note (Signed)
Controlled, no change in medication Initial rise because of excess sudafed DASH diet and commitment to daily physical activity for a minimum of 30 minutes discussed and encouraged, as a part of hypertension management. The importance of attaining a healthy weight is also discussed.  BP/Weight 06/02/2015 05/31/2015 02/11/2015 11/09/2014 08/04/2014 02/04/2014 01/22/2014  Systolic BP 140 140 124 120 128 134 148  Diastolic BP 86 95 82 80 88 84 [id number]  Wt. (Lbs) 164 170 164 165 170 172.8 170  BMI 28.14 30.12 28.14 28.31 29.17 30.13 29.64

## 2015-06-07 NOTE — Telephone Encounter (Signed)
The radiologist specified the order that has been placed, which does NOT say tomo, it  Is ordered as recommended by radiologist, pls follow through , let me know if I need to speak with radiology

## 2015-06-07 NOTE — Assessment & Plan Note (Signed)
Evaluated 2 daYS PRIO, still c/o excess cough, worse at night and loss of voice, no fever or chills, scant sputum. Educated and symptomatic treatment

## 2015-06-07 NOTE — Assessment & Plan Note (Signed)
Uncontrolled, depo medrol in office and phenergan Dm as cough suppressant for bed time use as needed

## 2015-06-07 NOTE — Assessment & Plan Note (Addendum)
Improved, initially assesed in the ED as having a panic attack, and started on hydroxyzine

## 2015-06-08 NOTE — Telephone Encounter (Signed)
Diag Mammo scheduled for 06/22/15 at 10:30 pt is aware

## 2015-06-08 NOTE — Telephone Encounter (Signed)
Orders entered

## 2015-06-08 NOTE — Addendum Note (Signed)
Addended by: Kandis FantasiaSLADE, Zameria Vogl B on: 06/08/2015 10:54 AM   Modules accepted: Orders

## 2015-06-17 ENCOUNTER — Other Ambulatory Visit (HOSPITAL_COMMUNITY): Payer: Self-pay | Admitting: Family Medicine

## 2015-06-17 DIAGNOSIS — N63 Unspecified lump in unspecified breast: Secondary | ICD-10-CM

## 2015-06-21 ENCOUNTER — Other Ambulatory Visit: Payer: Self-pay

## 2015-06-21 DIAGNOSIS — R928 Other abnormal and inconclusive findings on diagnostic imaging of breast: Secondary | ICD-10-CM

## 2015-06-22 ENCOUNTER — Ambulatory Visit (HOSPITAL_COMMUNITY)
Admission: RE | Admit: 2015-06-22 | Discharge: 2015-06-22 | Disposition: A | Discharge status: Home / Self Care | Payer: Medicaid Other | Source: Ambulatory Visit | Attending: Family Medicine | Admitting: Family Medicine

## 2015-06-22 ENCOUNTER — Encounter (HOSPITAL_COMMUNITY): Payer: Medicaid Other

## 2015-06-22 DIAGNOSIS — R928 Other abnormal and inconclusive findings on diagnostic imaging of breast: Secondary | ICD-10-CM | POA: Diagnosis present

## 2015-06-22 DIAGNOSIS — R921 Mammographic calcification found on diagnostic imaging of breast: Secondary | ICD-10-CM | POA: Insufficient documentation

## 2015-07-12 LAB — BASIC METABOLIC PANEL
BUN: 9 mg/dL (ref 7–25)
CALCIUM: 8.4 mg/dL — AB (ref 8.6–10.2)
CO2: 26 mmol/L (ref 20–31)
CREATININE: 0.93 mg/dL (ref 0.50–1.10)
Chloride: 103 mmol/L (ref 98–110)
GLUCOSE: 86 mg/dL (ref 65–99)
Potassium: 3.8 mmol/L (ref 3.5–5.3)
Sodium: 140 mmol/L (ref 135–146)

## 2015-07-12 LAB — CBC WITH DIFFERENTIAL/PLATELET
BASOS ABS: 0 {cells}/uL (ref 0–200)
Basophils Relative: 0 %
EOS PCT: 1 %
Eosinophils Absolute: 91 cells/uL (ref 15–500)
HCT: 39.7 % (ref 35.0–45.0)
HEMOGLOBIN: 13.1 g/dL (ref 11.7–15.5)
LYMPHS ABS: 3458 {cells}/uL (ref 850–3900)
LYMPHS PCT: 38 %
MCH: 27.9 pg (ref 27.0–33.0)
MCHC: 33 g/dL (ref 32.0–36.0)
MCV: 84.5 fL (ref 80.0–100.0)
MONOS PCT: 7 %
MPV: 9.1 fL (ref 7.5–12.5)
Monocytes Absolute: 637 cells/uL (ref 200–950)
NEUTROS PCT: 54 %
Neutro Abs: 4914 cells/uL (ref 1500–7800)
Platelets: 342 10*3/uL (ref 140–400)
RBC: 4.7 MIL/uL (ref 3.80–5.10)
RDW: 13.1 % (ref 11.0–15.0)
WBC: 9.1 10*3/uL (ref 3.8–10.8)

## 2015-07-12 LAB — LIPID PANEL
CHOL/HDL RATIO: 4.7 ratio (ref <=5.0)
CHOLESTEROL: 224 mg/dL — AB (ref 125–200)
HDL: 48 mg/dL (ref >=46)
LDL Cholesterol: 156 mg/dL — ABNORMAL HIGH (ref <130)
Triglycerides: 101 mg/dL (ref <150)
VLDL: 20 mg/dL (ref <30)

## 2015-07-12 LAB — URIC ACID: URIC ACID, SERUM: 3.8 mg/dL (ref 2.5–7.0)

## 2015-07-14 ENCOUNTER — Encounter: Payer: Self-pay | Admitting: Family Medicine

## 2015-07-14 ENCOUNTER — Ambulatory Visit (INDEPENDENT_AMBULATORY_CARE_PROVIDER_SITE_OTHER): Payer: Medicaid Other | Admitting: Family Medicine

## 2015-07-14 VITALS — BP 128/82 | HR 68 | Resp 18 | Ht 64.0 in | Wt 168.0 lb

## 2015-07-14 DIAGNOSIS — F411 Generalized anxiety disorder: Secondary | ICD-10-CM | POA: Diagnosis not present

## 2015-07-14 DIAGNOSIS — J3089 Other allergic rhinitis: Secondary | ICD-10-CM

## 2015-07-14 DIAGNOSIS — E663 Overweight: Secondary | ICD-10-CM

## 2015-07-14 DIAGNOSIS — I10 Essential (primary) hypertension: Secondary | ICD-10-CM

## 2015-07-14 DIAGNOSIS — E785 Hyperlipidemia, unspecified: Secondary | ICD-10-CM | POA: Diagnosis not present

## 2015-07-14 MED ORDER — ALPRAZOLAM 0.5 MG PO TABS: ORAL_TABLET | ORAL | Status: DC

## 2015-07-14 NOTE — Assessment & Plan Note (Signed)
Deteriorated. Patient re-educated about  the importance of commitment to a  minimum of 150 minutes of exercise per week.  The importance of healthy food choices with portion control discussed. Encouraged to start a food diary, count calories and to consider  joining a support group. Sample diet sheets offered. Goals set by the patient for the next several months.   Weight /BMI 07/14/2015 06/02/2015 05/31/2015  WEIGHT 168 lb 164 lb 170 lb  HEIGHT 5\' 4"  5\' 4"  5\' 3"   BMI 28.82 kg/m2 28.14 kg/m2 30.12 kg/m2

## 2015-07-14 NOTE — Assessment & Plan Note (Signed)
Hyperlipidemia:Low fat diet discussed and encouraged.   Lipid Panel  Lab Results  Component Value Date   CHOL 224* 07/12/2015   HDL 48 07/12/2015   LDLCALC 156* 07/12/2015   TRIG 101 07/12/2015   CHOLHDL 4.7 07/12/2015      Improved

## 2015-07-14 NOTE — Progress Notes (Signed)
Subjective:    [name], female    DOB: 10/17/1975, 40 y.o.   MRN: [id number]  HPI   Catherine Allen     MRN: [id number]      DOB: 11/28/1975   HPI Catherine Allen is here for follow up and re-evaluation of chronic medical conditions, medication management and review of any available recent lab and radiology data.  Preventive health is updated, specifically  Cancer screening and Immunization.    The PT denies any adverse reactions to current medications since the last visit. Requesats medication be available in the event of uncontrolled anxiety or recurrent panic atack  ROS Denies recent fever or chills. Denies sinus pressure, nasal congestion, ear pain or sore throat. Denies chest congestion, productive cough or wheezing. Denies chest pains, palpitations and leg swelling Denies abdominal pain, nausea, vomiting,diarrhea or constipation.   Denies dysuria, frequency, hesitancy or incontinence. Denies joint pain, swelling and limitation in mobility. Denies headaches, seizures, numbness, or tingling. Denies depression, c/o anxiety, is afraid of recurrent panic attack and wants to have xanax available in the event of a panic attack, states this has worked in the past despite trial of SSRI and buspar , no interest in taking xanax regularly and trying to deal with stress and avoid as much as possible,denies  insomnia. Denies skin break down or rash.   PE  BP 128/82 mmHg  Pulse 68  Resp 18  Ht 5\' 4"  (1.626 m)  Wt 168 lb (76.204 kg)  BMI 28.82 kg/m2  SpO2 99%  Patient alert and oriented and in no cardiopulmonary distress.  HEENT: No facial asymmetry, EOMI,   oropharynx pink and moist.  Neck supple no JVD, no mass.  Chest: Clear to auscultation bilaterally.  CVS: S1, S2 no murmurs, no S3.Regular rate.  ABD: Soft non tender.   Ext: No edema  MS: Adequate ROM spine, shoutlders, hips and knees.  Skin: Intact, no ulcerations or rash noted.  Psych: Good eye  contact, normal affect. Memory intact not anxious or depressed appearing.  CNS: CN 2-12 intact, power,  normal throughout.no focal deficits noted.   Assessment & Plan  Essential hypertension Controlled, no change in medication DASH diet and commitment to daily physical activity for a minimum of 30 minutes discussed and encouraged, as a part of hypertension management. The importance of attaining a healthy weight is also discussed.  BP/Weight 07/14/2015 06/02/2015 05/31/2015 02/11/2015 11/09/2014 08/04/2014 02/04/2014  Systolic BP 128 140 140 124 120 128 134  Diastolic BP 82 86 95 82 80 88 84  Wt. (Lbs) 168 164 170 164 165 170 172.8  BMI 28.82 28.14 30.12 28.14 28.31 29.17 30.13        Allergic rhinitis Controlled, no change in medication No flare of  Symptoms currently  Hyperlipemia Hyperlipidemia:Low fat diet discussed and encouraged.   Lipid Panel  Lab Results  Component Value Date   CHOL 224* 07/12/2015   HDL 48 07/12/2015   LDLCALC 156* 07/12/2015   TRIG 101 07/12/2015   CHOLHDL 4.7 07/12/2015      Improved  Overweight Deteriorated. Patient re-educated about  the importance of commitment to a  minimum of 150 minutes of exercise per week.  The importance of healthy food choices with portion control discussed. Encouraged to start a food diary, count calories and to consider  joining a support group. Sample diet sheets offered. Goals set by the patient for the next several months.   Weight /BMI 07/14/2015 06/02/2015 05/31/2015  WEIGHT  168 lb 164 lb 170 lb  HEIGHT 5\' 4"  5\' 4"  5\' 3"   BMI 28.82 kg/m2 28.14 kg/m2 30.12 kg/m2       GAD (generalized anxiety disorder) Pt prefers to deal with behavior modification and use xanax only if needed for extreme anxiety, limit is twice weekly. Reports no effect with other medications tried in the past        Review of Systems     Objective:   Physical Exam        Assessment & Plan:

## 2015-07-14 NOTE — Assessment & Plan Note (Signed)
Controlled, no change in medication No flare of  Symptoms currently

## 2015-07-14 NOTE — Patient Instructions (Signed)
F/u in 4.5 month, call if you need me before  CONGRATS on improved cholesterol, bP is good.  Management of anxiety as discussed, no more than 8 xanax prescribed per month.  Please work on good  health habits so that your health will improve. 1. Commitment to daily physical activity for 30 to 60  minutes, if you are able to do this.  2. Commitment to wise food choices. Aim for half of your  food intake to be vegetable and fruit, one quarter starchy foods, and one quarter protein. Try to eat on a regular schedule  3 meals per day, snacking between meals should be limited to vegetables or fruits or small portions of nuts. 64 ounces of water per day is generally recommended, unless you have specific health conditions, like heart failure or kidney failure where you will need to limit fluid intake.  3. Commitment to sufficient and a  good quality of physical and mental rest daily, generally between 6 to 8 hours per day.  WITH PERSISTANCE AND PERSEVERANCE, THE IMPOSSIBLE , BECOMES THE NORM!  Thank you  for choosing [facility]. We consider it a privelige to serve you.  Delivering excellent health care in a caring and  compassionate way is our goal.  Partnering with you,  so that together we can achieve this goal is our strategy.       Generalized Anxiety Disorder Generalized anxiety disorder (GAD) is a mental disorder. It interferes with life functions, including relationships, work, and school. GAD is different from normal anxiety, which everyone experiences at some point in their lives in response to specific life events and activities. Normal anxiety actually helps us prepare for and get through these life events and activities. Normal anxiety goes away after the event or activity is over.  GAD causes anxiety that is not necessarily related to specific events or activities. It also causes excess anxiety in proportion to specific events or activities. The anxiety associated with  GAD is also difficult to control. GAD can vary from mild to severe. People with severe GAD can have intense waves of anxiety with physical symptoms (panic attacks).  SYMPTOMS The anxiety and worry associated with GAD are difficult to control. This anxiety and worry are related to many life events and activities and also occur more days than not for 6 months or longer. People with GAD also have three or more of the following symptoms (one or more in children):  Restlessness.   Fatigue.  Difficulty concentrating.   Irritability.  Muscle tension.  Difficulty sleeping or unsatisfying sleep. DIAGNOSIS GAD is diagnosed through an assessment by your health care provider. Your health care provider will ask you questions aboutyour mood,physical symptoms, and events in your life. Your health care provider may ask you about your medical history and use of alcohol or drugs, including prescription medicines. Your health care provider may also do a physical exam and blood tests. Certain medical conditions and the use of certain substances can cause symptoms similar to those associated with GAD. Your health care provider may refer you to a mental health specialist for further evaluation. TREATMENT The following therapies are usually used to treat GAD:   Medication. Antidepressant medication usually is prescribed for long-term daily control. Antianxiety medicines may be added in severe cases, especially when panic attacks occur.   Talk therapy (psychotherapy). Certain types of talk therapy can be helpful in treating GAD by providing support, education, and guidance. A form of talk therapy called cognitive behavioral  therapy can teach you healthy ways to think about and react to daily life events and activities.  Stress managementtechniques. These include yoga, meditation, and exercise and can be very helpful when they are practiced regularly. A mental health specialist can help determine which  treatment is best for you. Some people see improvement with one therapy. However, other people require a combination of therapies.   This information is not intended to replace advice given to you by your health care provider. Make sure you discuss any questions you have with your health care provider.   Document Released: 04/22/2012 Document Revised: 01/16/2014 Document Reviewed: 04/22/2012 Elsevier Interactive Patient Education [employer]2016 Elsevier Inc.

## 2015-07-14 NOTE — Assessment & Plan Note (Signed)
Pt prefers to deal with behavior modification and use xanax only if needed for extreme anxiety, limit is twice weekly. Reports no effect with other medications tried in the past

## 2015-07-14 NOTE — Assessment & Plan Note (Signed)
Controlled, no change in medication DASH diet and commitment to daily physical activity for a minimum of 30 minutes discussed and encouraged, as a part of hypertension management. The importance of attaining a healthy weight is also discussed.  BP/Weight 07/14/2015 06/02/2015 05/31/2015 02/11/2015 11/09/2014 08/04/2014 02/04/2014  Systolic BP 128 140 140 124 120 128 134  Diastolic BP 82 86 95 82 80 88 84  Wt. (Lbs) 168 164 170 164 165 170 172.8  BMI 28.82 28.14 30.12 28.14 28.31 29.17 30.13

## 2015-08-25 ENCOUNTER — Other Ambulatory Visit: Payer: Self-pay | Admitting: Family Medicine

## 2015-08-26 ENCOUNTER — Other Ambulatory Visit: Payer: Self-pay | Admitting: Family Medicine

## 2015-09-16 ENCOUNTER — Other Ambulatory Visit: Payer: Self-pay | Admitting: Family Medicine

## 2015-11-15 ENCOUNTER — Telehealth: Payer: Self-pay | Admitting: Family Medicine

## 2015-11-15 DIAGNOSIS — Z09 Encounter for follow-up examination after completed treatment for conditions other than malignant neoplasm: Secondary | ICD-10-CM

## 2015-11-15 NOTE — Telephone Encounter (Signed)
Orders place.

## 2015-11-15 NOTE — Telephone Encounter (Signed)
[name] from [facility] called and asked if a Diag Mammo order could be placed so Catherine Allen can schedule her Diag mammo, please advise?

## 2015-11-16 ENCOUNTER — Telehealth: Payer: Self-pay | Admitting: Family Medicine

## 2015-11-16 NOTE — Telephone Encounter (Signed)
Catherine Allen said the wrong test was put in - please put in:  MM digital diagnostic tomo bilateral

## 2015-11-18 ENCOUNTER — Telehealth: Payer: Self-pay

## 2015-11-18 DIAGNOSIS — Z09 Encounter for follow-up examination after completed treatment for conditions other than malignant neoplasm: Secondary | ICD-10-CM

## 2015-11-18 NOTE — Telephone Encounter (Signed)
entered

## 2015-11-18 NOTE — Telephone Encounter (Signed)
Mammogram orders entered. ?

## 2015-11-21 ENCOUNTER — Other Ambulatory Visit: Payer: Self-pay | Admitting: Family Medicine

## 2015-11-22 ENCOUNTER — Other Ambulatory Visit: Payer: Self-pay | Admitting: Family Medicine

## 2015-11-23 LAB — BASIC METABOLIC PANEL
BUN: 9 mg/dL (ref 7–25)
CALCIUM: 8.8 mg/dL (ref 8.6–10.2)
CO2: 27 mmol/L (ref 20–31)
CREATININE: 1.13 mg/dL — AB (ref 0.50–1.10)
Chloride: 102 mmol/L (ref 98–110)
GLUCOSE: 85 mg/dL (ref 65–99)
Potassium: 3.5 mmol/L (ref 3.5–5.3)
SODIUM: 137 mmol/L (ref 135–146)

## 2015-11-23 LAB — LIPID PANEL
CHOL/HDL RATIO: 5.4 ratio — AB (ref <5.0)
Cholesterol: 239 mg/dL — ABNORMAL HIGH (ref <200)
HDL: 44 mg/dL — AB (ref >50)
LDL Cholesterol: 174 mg/dL — ABNORMAL HIGH (ref <100)
Triglycerides: 105 mg/dL (ref <150)
VLDL: 21 mg/dL (ref <30)

## 2015-11-24 ENCOUNTER — Ambulatory Visit (INDEPENDENT_AMBULATORY_CARE_PROVIDER_SITE_OTHER): Payer: Medicaid Other | Admitting: Family Medicine

## 2015-11-24 ENCOUNTER — Encounter: Payer: Self-pay | Admitting: Family Medicine

## 2015-11-24 VITALS — BP 122/82 | HR 102 | Resp 16 | Ht 64.0 in | Wt 168.4 lb

## 2015-11-24 DIAGNOSIS — E663 Overweight: Secondary | ICD-10-CM

## 2015-11-24 DIAGNOSIS — E785 Hyperlipidemia, unspecified: Secondary | ICD-10-CM | POA: Diagnosis not present

## 2015-11-24 DIAGNOSIS — I10 Essential (primary) hypertension: Secondary | ICD-10-CM

## 2015-11-24 DIAGNOSIS — G4700 Insomnia, unspecified: Secondary | ICD-10-CM | POA: Diagnosis not present

## 2015-11-24 NOTE — Patient Instructions (Addendum)
Physicl exam in 4 months, call if you need me before  Fasting lipid, chem 7, TSH and vit D in 4 months  Please work on lifestyle changes as we discussed  rept mammogram would be due in December, please contact radiology about this as discussed  Please work on good  health habits so that your health will improve. 1. Commitment to daily physical activity for 30 to 60  minutes, if you are able to do this.  2. Commitment to wise food choices. Aim for half of your  food intake to be vegetable and fruit, one quarter starchy foods, and one quarter protein. Try to eat on a regular schedule  3 meals per day, snacking between meals should be limited to vegetables or fruits or small portions of nuts. 64 ounces of water per day is generally recommended, unless you have specific health conditions, like heart failure or kidney failure where you will need to limit fluid intake.  3. Commitment to sufficient and a  good quality of physical and mental rest daily, generally between 6 to 8 hours per day.  WITH PERSISTANCE AND PERSEVERANCE, THE IMPOSSIBLE , BECOMES THE NORM! Thank you  for choosing [facility]. We consider it a privelige to serve you.  Delivering excellent health care in a caring and  compassionate way is our goal.  Partnering with you,  so that together we can achieve this goal is our strategy.

## 2015-11-28 NOTE — Assessment & Plan Note (Addendum)
Hyperlipidemia:Low fat diet discussed and encouraged.   Lipid Panel  Lab Results  Component Value Date   CHOL 239 (H) 11/22/2015   HDL 44 (L) 11/22/2015   LDLCALC 174 (H) 11/22/2015   TRIG 105 11/22/2015   CHOLHDL 5.4 (H) 11/22/2015   Uncontrolled, not at goal, medication encouraged as well, pt refuses, will f/u in 4 months, 5 mins of dietary education provided

## 2015-11-28 NOTE — Progress Notes (Signed)
   Catherine Allen     MRN: [id number]      DOB: 07/01/1975   HPI Catherine Allen is here for follow up and re-evaluation of chronic medical conditions, medication management and review of any available recent lab and radiology data.  Preventive health is updated, specifically  Cancer screening and Immunization.   Questions or concerns regarding consultations or procedures which the PT has had in the interim are  addressed. The PT denies any adverse reactions to current medications since the last visit.  There are no new concerns.  There are no specific complaints   ROS Denies recent fever or chills. Denies sinus pressure, nasal congestion, ear pain or sore throat. Denies chest congestion, productive cough or wheezing. Denies chest pains, palpitations and leg swelling Denies abdominal pain, nausea, vomiting,diarrhea or constipation.   Denies dysuria, frequency, hesitancy or incontinence. Denies joint pain, swelling and limitation in mobility. Denies headaches, seizures, numbness, or tingling. Denies depression, anxiety or insomnia. Denies skin break down or rash.   PE  BP 122/82   Pulse (!) 102   Resp 16   Ht 5\' 4"  (1.626 m)   Wt 168 lb 6.4 oz (76.4 kg)   SpO2 96%   BMI 28.91 kg/m   Patient alert and oriented and in no cardiopulmonary distress.  HEENT: No facial asymmetry, EOMI,   oropharynx pink and moist.  Neck supple no JVD, no mass.  Chest: Clear to auscultation bilaterally.  CVS: S1, S2 no murmurs, no S3.Regular rate.  ABD: Soft non tender.   Ext: No edema  MS: Adequate ROM spine, shoulders, hips and knees.  Skin: Intact, no ulcerations or rash noted.  Psych: Good eye contact, normal affect. Memory intact not anxious or depressed appearing.  CNS: CN 2-12 intact, power,  normal throughout.no focal deficits noted.   Assessment & Plan Essential hypertension Controlled, no change in medication DASH diet and commitment to daily physical activity for a minimum  of 30 minutes discussed and encouraged, as a part of hypertension management. The importance of attaining a healthy weight is also discussed.  BP/Weight 11/24/2015 07/14/2015 06/02/2015 05/31/2015 02/11/2015 11/09/2014 08/04/2014  Systolic BP 122 128 140 140 124 120 128  Diastolic BP 82 82 86 95 82 80 88  Wt. (Lbs) 168.4 168 164 170 164 165 170  BMI 28.91 28.82 28.14 30.12 28.14 28.31 29.17       Hyperlipemia Hyperlipidemia:Low fat diet discussed and encouraged.   Lipid Panel  Lab Results  Component Value Date   CHOL 239 (H) 11/22/2015   HDL 44 (L) 11/22/2015   LDLCALC 174 (H) 11/22/2015   TRIG 105 11/22/2015   CHOLHDL 5.4 (H) 11/22/2015   Uncontrolled, not at goal, medication encouraged as well, pt refuses, will f/u in 4 months, 5 mins of dietary education provided    Overweight Unchanged Patient re-educated about  the importance of commitment to a  minimum of 150 minutes of exercise per week.  The importance of healthy food choices with portion control discussed. Encouraged to start a food diary, count calories and to consider  joining a support group. Sample diet sheets offered. Goals set by the patient for the next several months.   Weight /BMI 11/24/2015 07/14/2015 06/02/2015  WEIGHT 168 lb 6.4 oz 168 lb 164 lb  HEIGHT 5\' 4"  5\' 4"  5\' 4"   BMI 28.91 kg/m2 28.82 kg/m2 28.14 kg/m2      Insomnia Improved, no need for pharmacologic management, good sleep hygiene reviewed

## 2015-11-28 NOTE — Assessment & Plan Note (Signed)
Improved, no need for pharmacologic management, good sleep hygiene reviewed

## 2015-11-28 NOTE — Assessment & Plan Note (Signed)
Unchanged Patient re-educated about  the importance of commitment to a  minimum of 150 minutes of exercise per week.  The importance of healthy food choices with portion control discussed. Encouraged to start a food diary, count calories and to consider  joining a support group. Sample diet sheets offered. Goals set by the patient for the next several months.   Weight /BMI 11/24/2015 07/14/2015 06/02/2015  WEIGHT 168 lb 6.4 oz 168 lb 164 lb  HEIGHT 5\' 4"  5\' 4"  5\' 4"   BMI 28.91 kg/m2 28.82 kg/m2 28.14 kg/m2

## 2015-11-28 NOTE — Assessment & Plan Note (Signed)
Controlled, no change in medication DASH diet and commitment to daily physical activity for a minimum of 30 minutes discussed and encouraged, as a part of hypertension management. The importance of attaining a healthy weight is also discussed.  BP/Weight 11/24/2015 07/14/2015 06/02/2015 05/31/2015 02/11/2015 11/09/2014 08/04/2014  Systolic BP 122 128 140 140 124 120 128  Diastolic BP 82 82 86 95 82 80 88  Wt. (Lbs) 168.4 168 164 170 164 165 170  BMI 28.91 28.82 28.14 30.12 28.14 28.31 29.17

## 2015-11-29 ENCOUNTER — Other Ambulatory Visit: Payer: Self-pay

## 2015-11-29 DIAGNOSIS — R928 Other abnormal and inconclusive findings on diagnostic imaging of breast: Secondary | ICD-10-CM

## 2015-11-30 ENCOUNTER — Ambulatory Visit (HOSPITAL_COMMUNITY)
Admission: RE | Admit: 2015-11-30 | Discharge: 2015-11-30 | Disposition: A | Discharge status: Home / Self Care | Payer: Medicaid Other | Source: Ambulatory Visit | Attending: Family Medicine | Admitting: Family Medicine

## 2015-11-30 ENCOUNTER — Inpatient Hospital Stay (HOSPITAL_COMMUNITY): Admission: RE | Admit: 2015-11-30 | Payer: Medicaid Other | Source: Ambulatory Visit

## 2015-11-30 DIAGNOSIS — R928 Other abnormal and inconclusive findings on diagnostic imaging of breast: Secondary | ICD-10-CM | POA: Diagnosis not present

## 2015-11-30 DIAGNOSIS — Z09 Encounter for follow-up examination after completed treatment for conditions other than malignant neoplasm: Secondary | ICD-10-CM

## 2015-12-03 ENCOUNTER — Other Ambulatory Visit: Payer: Self-pay | Admitting: Family Medicine

## 2015-12-03 DIAGNOSIS — A6004 Herpesviral vulvovaginitis: Secondary | ICD-10-CM

## 2015-12-11 ENCOUNTER — Other Ambulatory Visit: Payer: Self-pay | Admitting: Family Medicine

## 2015-12-27 ENCOUNTER — Encounter: Payer: Self-pay | Admitting: Family Medicine

## 2015-12-27 ENCOUNTER — Ambulatory Visit (INDEPENDENT_AMBULATORY_CARE_PROVIDER_SITE_OTHER): Payer: Medicaid Other | Admitting: Family Medicine

## 2015-12-27 VITALS — BP 122/82 | HR 93 | Temp 99.2°F | Resp 16 | Ht 64.0 in

## 2015-12-27 DIAGNOSIS — J209 Acute bronchitis, unspecified: Secondary | ICD-10-CM | POA: Diagnosis not present

## 2015-12-27 DIAGNOSIS — J040 Acute laryngitis: Secondary | ICD-10-CM | POA: Insufficient documentation

## 2015-12-27 DIAGNOSIS — J301 Allergic rhinitis due to pollen: Secondary | ICD-10-CM | POA: Diagnosis not present

## 2015-12-27 MED ORDER — AZITHROMYCIN 250 MG PO TABS: ORAL_TABLET | ORAL | 0 refills | Status: DC

## 2015-12-27 MED ORDER — PREDNISONE 5 MG PO TABS: 5.0000 mg | ORAL_TABLET | Freq: Two times a day (BID) | ORAL | 0 refills | Status: AC

## 2015-12-27 MED ORDER — FLUCONAZOLE 150 MG PO TABS: ORAL_TABLET | ORAL | 0 refills | Status: DC

## 2015-12-27 MED ORDER — TRIAMCINOLONE ACETONIDE 55 MCG/ACT NA AERO: 2.0000 | INHALATION_SPRAY | Freq: Every day | NASAL | 12 refills | Status: DC

## 2015-12-27 MED ORDER — PROMETHAZINE-DM 6.25-15 MG/5ML PO SYRP: ORAL_SOLUTION | ORAL | 0 refills | Status: DC

## 2015-12-27 MED ORDER — METHYLPREDNISOLONE ACETATE 80 MG/ML IJ SUSP
80.0000 mg | Freq: Once | INTRAMUSCULAR | Status: AC
  Administered 2015-12-27: 80 mg via INTRAMUSCULAR

## 2015-12-27 MED ORDER — BENZONATATE 100 MG PO CAPS: 100.0000 mg | ORAL_CAPSULE | Freq: Two times a day (BID) | ORAL | 0 refills | Status: DC | PRN

## 2015-12-27 NOTE — Patient Instructions (Signed)
Keep appt in March as before, call if you need me sooner  You are treated for acute laryngitis, uncontrolled allergies and acute bronchitis. Depo medrol injection in office and new medications are sent in  VOICE REST and a lot of fluids are necessary to help the loss of voice  Return to work on 12/29/2015

## 2015-12-29 ENCOUNTER — Telehealth: Payer: Self-pay | Admitting: Family Medicine

## 2015-12-29 NOTE — Telephone Encounter (Signed)
Catherine Allen is asking if there is something else that she can take for her cough, she is no better, very hoarse, please advise?

## 2015-12-29 NOTE — Telephone Encounter (Signed)
No other medication to add, voice rest and time, salt watwer gargles, if needed, her work excuse can be extended to next week Tuesday, let me know, I will sign on extension letter

## 2015-12-30 ENCOUNTER — Encounter: Payer: Self-pay | Admitting: Family Medicine

## 2015-12-30 NOTE — Telephone Encounter (Signed)
Note printed.

## 2016-01-03 NOTE — Assessment & Plan Note (Signed)
Voice rest and anti inflammatory meds

## 2016-01-03 NOTE — Progress Notes (Signed)
   Catherine Allen     MRN: [id number]      DOB: 08/05/1975   HPI Catherine Allen is here  With a 2 day h/o acute voice loss and excessive cough, scant sputum , also excess clear nasal drainage and watery eyes and sneezing Was to start new job,  But unable to speak  Intermittent chills ROS  Denies chest pains, palpitations and leg swelling Denies abdominal pain, nausea, vomiting,diarrhea or constipation.   Denies dysuria, frequency, hesitancy or incontinence. Denies joint pain, swelling and limitation in mobility. Denies headaches, seizures, numbness, or tingling. Denies depression, anxiety or insomnia. Denies skin break down or rash.   PE  BP 122/82   Pulse 93   Temp 99.2 F (37.3 C) (Oral)   Resp 16   Ht 5\' 4"  (1.626 m)   LMP 09/13/2015   SpO2 96%   Patient alert and oriented and in no cardiopulmonary distress.Hoarse, barely able to speak  HEENT: No facial asymmetry, EOMI,   oropharynx pink and moist.  Neck supple no JVD, no mass.erythema and edema of nasal mucosa,  Excessive watery of eyes with conjunctival erythema  Chest: Adequate air entry bilaterally,  Scattered crackles, no wheezes  CVS: S1, S2 no murmurs, no S3.Regular rate.  ABD: Soft non tender.   Ext: No edema  MS: Adequate ROM spine, shoulders, hips and knees.  Skin: Intact, no ulcerations or rash noted.  Psych: Good eye contact, normal affect. Memory intact not anxious or depressed appearing.  CNS: CN 2-12 intact, power,  normal throughout.no focal deficits noted.   Assessment & Plan  Allergic rhinitis Uncontrolled, depo medrol in office an aggressive management of allergies  Acute bronchitis Decongestant and antibiotic prescribed  Laryngitis, acute Voice rest and anti inflammatory meds

## 2016-01-03 NOTE — Assessment & Plan Note (Signed)
Decongestant and antibiotic prescribed 

## 2016-01-03 NOTE — Assessment & Plan Note (Signed)
Uncontrolled, depo medrol in office an aggressive management of allergies

## 2016-01-17 NOTE — Progress Notes (Signed)
error 

## 2016-03-23 ENCOUNTER — Encounter: Payer: Self-pay | Admitting: Family Medicine

## 2016-03-23 ENCOUNTER — Ambulatory Visit (INDEPENDENT_AMBULATORY_CARE_PROVIDER_SITE_OTHER): Payer: Medicaid Other | Admitting: Family Medicine

## 2016-03-23 VITALS — BP 122/80 | HR 99 | Resp 15 | Ht 64.0 in | Wt 163.0 lb

## 2016-03-23 DIAGNOSIS — Z1211 Encounter for screening for malignant neoplasm of colon: Secondary | ICD-10-CM | POA: Diagnosis not present

## 2016-03-23 DIAGNOSIS — J301 Allergic rhinitis due to pollen: Secondary | ICD-10-CM

## 2016-03-23 DIAGNOSIS — E785 Hyperlipidemia, unspecified: Secondary | ICD-10-CM

## 2016-03-23 DIAGNOSIS — Z0000 Encounter for general adult medical examination without abnormal findings: Secondary | ICD-10-CM

## 2016-03-23 DIAGNOSIS — I10 Essential (primary) hypertension: Secondary | ICD-10-CM

## 2016-03-23 LAB — BASIC METABOLIC PANEL
BUN: 8 mg/dL (ref 7–25)
CHLORIDE: 104 mmol/L (ref 98–110)
CO2: 25 mmol/L (ref 20–31)
CREATININE: 0.97 mg/dL (ref 0.50–1.10)
Calcium: 8.8 mg/dL (ref 8.6–10.2)
GLUCOSE: 83 mg/dL (ref 65–99)
Potassium: 3.9 mmol/L (ref 3.5–5.3)
Sodium: 138 mmol/L (ref 135–146)

## 2016-03-23 LAB — POC HEMOCCULT BLD/STL (OFFICE/1-CARD/DIAGNOSTIC): Fecal Occult Blood, POC: NEGATIVE

## 2016-03-23 LAB — LIPID PANEL
CHOLESTEROL: 231 mg/dL — AB (ref <200)
HDL: 44 mg/dL — ABNORMAL LOW (ref >50)
LDL Cholesterol: 164 mg/dL — ABNORMAL HIGH (ref <100)
TRIGLYCERIDES: 113 mg/dL (ref <150)
Total CHOL/HDL Ratio: 5.3 Ratio — ABNORMAL HIGH (ref <5.0)
VLDL: 23 mg/dL (ref <30)

## 2016-03-23 LAB — VITAMIN D 25 HYDROXY (VIT D DEFICIENCY, FRACTURES): VIT D 25 HYDROXY: 32 ng/mL (ref 30–100)

## 2016-03-23 LAB — TSH: TSH: 2.17 mIU/L

## 2016-03-23 MED ORDER — AZELASTINE HCL 0.1 % NA SOLN: 2.0000 | Freq: Two times a day (BID) | NASAL | 12 refills | Status: DC

## 2016-03-23 NOTE — Assessment & Plan Note (Signed)
Uncontrolled add daily astellin

## 2016-03-23 NOTE — Progress Notes (Signed)
    Catherine Allen     MRN: [id number]      DOB: 12/26/1975  HPI: Patient is in for annual physical exam. c/o 1 week h/o increased sneezing, nasal drainage and scratchy throat, denies fever , chills or cough, using zyrtec Recent labs, if available are reviewed. Immunization is reviewed , and  updated if needed.   PE: Pleasant  female, alert and oriented x 3, in no cardio-pulmonary distress. Afebrile. HEENT No facial trauma or asymetry. Sinuses non tender.  Extra occullar muscles intact, pupils equally reactive to light. External ears normal, tympanic membranes clear. Oropharynx moist, no exudate. Neck: supple, no adenopathy,JVD or thyromegaly.No bruits.  Chest: Clear to ascultation bilaterally.No crackles or wheezes. Non tender to palpation  Breast: No asymetry,no masses or lumps. No tenderness. No nipple discharge or inversion. No axillary or supraclavicular adenopathy  Cardiovascular system; Heart sounds normal,  S1 and  S2 ,no S3.  No murmur, or thrill. Apical beat not displaced Peripheral pulses normal.  Abdomen: Soft, non tender, no organomegaly or masses. No bruits. Bowel sounds normal. No guarding, tenderness or rebound.  Rectal:  Normal sphincter tone. No rectal mass. Guaiac negative stool.  GU: Not examined, asymptomatic  Musculoskeletal exam: Full ROM of spine, hips , shoulders and knees. No deformity ,swelling or crepitus noted. No muscle wasting or atrophy.   Neurologic: Cranial nerves 2 to 12 intact. Power, tone ,sensation and reflexes normal throughout. No disturbance in gait. No tremor.  Skin: Intact, no ulceration, erythema , scaling or rash noted. Pigmentation normal throughout  Psych; Normal mood and affect. Judgement and concentration normal   Assessment & Plan:  Annual physical exam Annual exam as documented. Counseling done  re healthy lifestyle involving commitment to 150 minutes exercise per week, heart healthy diet, and  attaining healthy weight.The importance of adequate sleep also discussed.  Immunization and cancer screening needs are specifically addressed at this visit.   Allergic rhinitis Uncontrolled add daily astellin  Hyperlipemia Hyperlipidemia:Low fat diet discussed and encouraged.   Lipid Panel  Lab Results  Component Value Date   CHOL 231 (H) 03/22/2016   HDL 44 (L) 03/22/2016   LDLCALC 164 (H) 03/22/2016   TRIG 113 03/22/2016   CHOLHDL 5.3 (H) 03/22/2016     Updated lab needed at/ before next visit.

## 2016-03-23 NOTE — Assessment & Plan Note (Signed)
Annual exam as documented. Counseling done  re healthy lifestyle involving commitment to 150 minutes exercise per week, heart healthy diet, and attaining healthy weight.The importance of adequate sleep also discussed.  Immunization and cancer screening needs are specifically addressed at this visit.  

## 2016-03-23 NOTE — Assessment & Plan Note (Signed)
Hyperlipidemia:Low fat diet discussed and encouraged.   Lipid Panel  Lab Results  Component Value Date   CHOL 231 (H) 03/22/2016   HDL 44 (L) 03/22/2016   LDLCALC 164 (H) 03/22/2016   TRIG 113 03/22/2016   CHOLHDL 5.3 (H) 03/22/2016     Updated lab needed at/ before next visit.

## 2016-03-23 NOTE — Patient Instructions (Signed)
f/u in 5.5 month, call if you need me sooner  Please commit to reduce fat diet since your cholesterol is too high.  Please commitment to daily exercise for at least 30 minutes to improve your good cholesterol.  Please commit to once daily over-the-counter vitamin D3 1000 IUs.  New medication Astelin is prescribed to help with allergy control.Continue zyrtec  Fasting fasting lab work is due one week before your next visit.CBC, lipid, chem 7  Please work on good  health habits so that your health will improve. 1. Commitment to daily physical activity for 30 to 60  minutes, if you are able to do this.  2. Commitment to wise food choices. Aim for half of your  food intake to be vegetable and fruit, one quarter starchy foods, and one quarter protein. Try to eat on a regular schedule  3 meals per day, snacking between meals should be limited to vegetables or fruits or small portions of nuts. 64 ounces of water per day is generally recommended, unless you have specific health conditions, like heart failure or kidney failure where you will need to limit fluid intake.  3. Commitment to sufficient and a  good quality of physical and mental rest daily, generally between 6 to 8 hours per day.  WITH PERSISTANCE AND PERSEVERANCE, THE IMPOSSIBLE , BECOMES THE NORM! Thank you  for choosing [facility]. We consider it a privelige to serve you.  Delivering excellent health care in a caring and  compassionate way is our goal.  Partnering with you,  so that together we can achieve this goal is our strategy.

## 2016-05-03 ENCOUNTER — Telehealth: Payer: Self-pay | Admitting: Family Medicine

## 2016-05-03 MED ORDER — ACYCLOVIR 400 MG PO TABS: 400.0000 mg | ORAL_TABLET | Freq: Three times a day (TID) | ORAL | 0 refills | Status: DC

## 2016-05-03 NOTE — Telephone Encounter (Signed)
Patient ran out of daily acyclovir in feb but now having outbreak symptoms and is wanting the "outbreak dosing" 5 per day called in. Uses [facility]

## 2016-05-03 NOTE — Telephone Encounter (Signed)
pls send in acyclovir 400 mg three times daily for 10 days

## 2016-05-03 NOTE — Telephone Encounter (Signed)
Med sent and pt aware °

## 2016-05-03 NOTE — Telephone Encounter (Signed)
Patient calling with complaint of pain and burning when urinating.  Started about 2 days ago.  Thinks it is a herpes outbreak, but not sure since she hasn't had these symptoms but knows she has the virus.  Please advise or let her know when she must come in.  If Rx is written pt uses [facility].

## 2016-05-04 ENCOUNTER — Telehealth: Payer: Self-pay | Admitting: Family Medicine

## 2016-05-04 NOTE — Telephone Encounter (Signed)
Patient called requesting to be put thru to the nurse.  Stated she spoke with you yesterday about her symptoms related to possible bladder infection.  She states she empties her bladder but still feels like she has to urinate again. This particular symptom started up yesterday.

## 2016-05-05 ENCOUNTER — Ambulatory Visit (INDEPENDENT_AMBULATORY_CARE_PROVIDER_SITE_OTHER): Payer: Medicaid Other

## 2016-05-05 DIAGNOSIS — R3915 Urgency of urination: Secondary | ICD-10-CM | POA: Diagnosis not present

## 2016-05-05 DIAGNOSIS — R350 Frequency of micturition: Secondary | ICD-10-CM

## 2016-05-05 LAB — POCT URINALYSIS DIPSTICK
BILIRUBIN UA: NEGATIVE
GLUCOSE UA: NEGATIVE
Ketones, UA: NEGATIVE
LEUKOCYTES UA: NEGATIVE
NITRITE UA: NEGATIVE
PH UA: 7 (ref 5.0–8.0)
Protein, UA: NEGATIVE
RBC UA: NEGATIVE
Spec Grav, UA: 1.02 (ref 1.010–1.025)
UROBILINOGEN UA: 0.2 U/dL

## 2016-05-05 NOTE — Telephone Encounter (Signed)
May come in this am for a nurse visit

## 2016-05-05 NOTE — Progress Notes (Signed)
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 Pt agrees

## 2016-05-28 ENCOUNTER — Other Ambulatory Visit: Payer: Self-pay | Admitting: Family Medicine

## 2016-09-04 LAB — BASIC METABOLIC PANEL
BUN: 9 mg/dL (ref 7–25)
CHLORIDE: 105 mmol/L (ref 98–110)
CO2: 25 mmol/L (ref 20–32)
Calcium: 8.7 mg/dL (ref 8.6–10.2)
Creat: 1.01 mg/dL (ref 0.50–1.10)
Glucose, Bld: 92 mg/dL (ref 65–99)
POTASSIUM: 3.6 mmol/L (ref 3.5–5.3)
SODIUM: 138 mmol/L (ref 135–146)

## 2016-09-04 LAB — LIPID PANEL
CHOLESTEROL: 245 mg/dL — AB (ref <200)
HDL: 47 mg/dL — ABNORMAL LOW (ref >50)
LDL CALC: 175 mg/dL — AB (ref <100)
TRIGLYCERIDES: 114 mg/dL (ref <150)
Total CHOL/HDL Ratio: 5.2 Ratio — ABNORMAL HIGH (ref <5.0)
VLDL: 23 mg/dL (ref <30)

## 2016-09-04 LAB — CBC
HCT: 41.3 % (ref 35.0–45.0)
HEMOGLOBIN: 13.7 g/dL (ref 11.7–15.5)
MCH: 27.9 pg (ref 27.0–33.0)
MCHC: 33.2 g/dL (ref 32.0–36.0)
MCV: 84.1 fL (ref 80.0–100.0)
MPV: 9.6 fL (ref 7.5–12.5)
Platelets: 350 10*3/uL (ref 140–400)
RBC: 4.91 MIL/uL (ref 3.80–5.10)
RDW: 12.7 % (ref 11.0–15.0)
WBC: 7.8 10*3/uL (ref 3.8–10.8)

## 2016-09-06 ENCOUNTER — Encounter: Payer: Self-pay | Admitting: Family Medicine

## 2016-09-06 ENCOUNTER — Ambulatory Visit: Payer: Medicaid Other | Admitting: Family Medicine

## 2016-09-06 ENCOUNTER — Ambulatory Visit (INDEPENDENT_AMBULATORY_CARE_PROVIDER_SITE_OTHER): Payer: Medicaid Other | Admitting: Family Medicine

## 2016-09-06 VITALS — BP 128/82 | HR 76 | Resp 16 | Ht 64.0 in | Wt 166.0 lb

## 2016-09-06 DIAGNOSIS — R921 Mammographic calcification found on diagnostic imaging of breast: Secondary | ICD-10-CM | POA: Diagnosis not present

## 2016-09-06 DIAGNOSIS — E663 Overweight: Secondary | ICD-10-CM

## 2016-09-06 DIAGNOSIS — E785 Hyperlipidemia, unspecified: Secondary | ICD-10-CM

## 2016-09-06 DIAGNOSIS — I10 Essential (primary) hypertension: Secondary | ICD-10-CM | POA: Diagnosis not present

## 2016-09-06 NOTE — Patient Instructions (Addendum)
F/u in 5.5 months, call if you need me be fore  Fasting lipid, chem 7 and EgFR  We will sched bilateral diag mammogram with magnification views of left breast due 11/22 or after and call or  send a my chart message  you wth appt info morning appt per your request  Fasting lipid and chem 7 1 week prior to next visit  Goal of 5 pounds weight loss, improved lipids  It is important that you exercise regularly at least 30 minutes 5 times a week. If you develop chest pain, have severe difficulty breathing, or feel very tired, stop exercising immediately and seek medical attention    Keep on being involved in your health you are the driver

## 2016-09-11 DIAGNOSIS — R921 Mammographic calcification found on diagnostic imaging of breast: Secondary | ICD-10-CM | POA: Insufficient documentation

## 2016-09-11 NOTE — Progress Notes (Signed)
Catherine Allen     MRN: [id number]      DOB: Oct 21, 1975   HPI Catherine Allen is here for follow up and re-evaluation of chronic medical conditions, medication management and review of any available recent lab and radiology data.  Preventive health is updated, specifically  Cancer screening and Immunization.  Refuses flu vaccine despite re education Questions or concerns regarding consultations or procedures which the PT has had in the interim are  addressed. The PT denies any adverse reactions to current medications since the last visit.  Has had several episodes of severe GERD symptoms in recent times, responds to OTC PPI, but she is able to associate the episodes with certain foods , hence will plan to avoid these foods Reports low fat diet with little food intake hence puzzled at elevated lipids, no regular exercise  Commitment unfortunately  ROS Denies recent fever or chills. Denies sinus pressure, nasal congestion, ear pain or sore throat. Denies chest congestion, productive cough or wheezing. Denies chest pains, palpitations and leg swelling    Denies dysuria, frequency, hesitancy or incontinence. Denies joint pain, swelling and limitation in mobility. Denies headaches, seizures, numbness, or tingling. Denies depression, anxiety or insomnia. Denies skin break down or rash.   PE  BP 128/82   Pulse 76   Resp 16   Ht 5\' 4"  (1.626 m)   Wt 166 lb (75.3 kg)   SpO2 99%   BMI 28.49 kg/m   Patient alert and oriented and in no cardiopulmonary distress.  HEENT: No facial asymmetry, EOMI,   oropharynx pink and moist.  Neck supple no JVD, no mass.  Chest: Clear to auscultation bilaterally.  CVS: S1, S2 no murmurs, no S3.Regular rate.  ABD: Soft non tender.   Ext: No edema  MS: Adequate ROM spine, shoulders, hips and knees.  Skin: Intact, no ulcerations or rash noted.  Psych: Good eye contact, normal affect. Memory intact not anxious or depressed appearing.  CNS: CN  2-12 intact, power,  normal throughout.no focal deficits noted.   Assessment & Plan Essential hypertension Controlled, no change in medication DASH diet and commitment to daily physical activity for a minimum of 30 minutes discussed and encouraged, as a part of hypertension management. The importance of attaining a healthy weight is also discussed.  BP/Weight 09/06/2016 03/23/2016 12/27/2015 11/24/2015 07/14/2015 06/02/2015 05/31/2015  Systolic BP 128 122 122 122 128 140 140  Diastolic BP 82 80 82 82 82 86 95  Wt. (Lbs) 166 163 - 168.4 168 164 170  BMI 28.49 27.98 - 28.91 28.82 28.14 30.12       Hyperlipemia Hyperlipidemia:Low fat diet discussed and encouraged.   Lipid Panel  Lab Results  Component Value Date   CHOL 245 (H) 09/04/2016   HDL 47 (L) 09/04/2016   LDLCALC 175 (H) 09/04/2016   TRIG 114 09/04/2016   CHOLHDL 5.2 (H) 09/04/2016  deteriorated with high CV risk, needs to address this with medications but resistsant, will work on diet     Overweight Deteriorated. Patient re-educated about  the importance of commitment to a  minimum of 150 minutes of exercise per week.  The importance of healthy food choices with portion control discussed. Encouraged to start a food diary, count calories and to consider  joining a support group. Sample diet sheets offered. Goals set by the patient for the next several months.   Weight /BMI 09/06/2016 03/23/2016 12/27/2015  WEIGHT 166 lb 163 lb -  HEIGHT 5\' 4"  5\' 4"  5'  4"  BMI 28.49 kg/m2 27.98 kg/m2 -      Breast calcifications on mammogram Will schedule bilateral diag mammogram with magnification of left breast

## 2016-09-11 NOTE — Assessment & Plan Note (Signed)
Hyperlipidemia:Low fat diet discussed and encouraged.   Lipid Panel  Lab Results  Component Value Date   CHOL 245 (H) 09/04/2016   HDL 47 (L) 09/04/2016   LDLCALC 175 (H) 09/04/2016   TRIG 114 09/04/2016   CHOLHDL 5.2 (H) 09/04/2016  deteriorated with high CV risk, needs to address this with medications but resistsant, will work on diet

## 2016-09-11 NOTE — Assessment & Plan Note (Signed)
Will schedule bilateral diag mammogram with magnification of left breast

## 2016-09-11 NOTE — Assessment & Plan Note (Signed)
Deteriorated. Patient re-educated about  the importance of commitment to a  minimum of 150 minutes of exercise per week.  The importance of healthy food choices with portion control discussed. Encouraged to start a food diary, count calories and to consider  joining a support group. Sample diet sheets offered. Goals set by the patient for the next several months.   Weight /BMI 09/06/2016 03/23/2016 12/27/2015  WEIGHT 166 lb 163 lb -  HEIGHT 5\' 4"  5\' 4"  5\' 4"   BMI 28.49 kg/m2 27.98 kg/m2 -

## 2016-09-11 NOTE — Assessment & Plan Note (Signed)
Controlled, no change in medication DASH diet and commitment to daily physical activity for a minimum of 30 minutes discussed and encouraged, as a part of hypertension management. The importance of attaining a healthy weight is also discussed.  BP/Weight 09/06/2016 03/23/2016 12/27/2015 11/24/2015 07/14/2015 06/02/2015 05/31/2015  Systolic BP 128 122 122 122 128 140 140  Diastolic BP 82 80 82 82 82 86 95  Wt. (Lbs) 166 163 - 168.4 168 164 170  BMI 28.49 27.98 - 28.91 28.82 28.14 30.12

## 2016-09-12 NOTE — Addendum Note (Signed)
Addended by: Adella HareBOOTHE, Jiovani Mccammon B on: 09/12/2016 11:45 AM   Modules accepted: Orders

## 2016-09-18 ENCOUNTER — Other Ambulatory Visit: Payer: Self-pay | Admitting: Family Medicine

## 2016-10-17 ENCOUNTER — Telehealth: Payer: Self-pay | Admitting: Family Medicine

## 2016-10-17 NOTE — Telephone Encounter (Signed)
Patient is requesting to speak to someone about numbness/weakness in her left hand  Cb#[phone]

## 2016-10-18 ENCOUNTER — Encounter (HOSPITAL_COMMUNITY): Payer: Self-pay | Admitting: Emergency Medicine

## 2016-10-18 ENCOUNTER — Other Ambulatory Visit: Payer: Self-pay | Admitting: Family Medicine

## 2016-10-18 ENCOUNTER — Emergency Department (HOSPITAL_COMMUNITY)
Admission: EM | Admit: 2016-10-18 | Discharge: 2016-10-18 | Disposition: A | Discharge status: Home / Self Care | Payer: Medicaid Other | Attending: Emergency Medicine | Admitting: Emergency Medicine

## 2016-10-18 DIAGNOSIS — Z79899 Other long term (current) drug therapy: Secondary | ICD-10-CM | POA: Diagnosis not present

## 2016-10-18 DIAGNOSIS — I10 Essential (primary) hypertension: Secondary | ICD-10-CM | POA: Insufficient documentation

## 2016-10-18 DIAGNOSIS — F419 Anxiety disorder, unspecified: Secondary | ICD-10-CM | POA: Insufficient documentation

## 2016-10-18 DIAGNOSIS — R202 Paresthesia of skin: Secondary | ICD-10-CM | POA: Insufficient documentation

## 2016-10-18 DIAGNOSIS — R200 Anesthesia of skin: Secondary | ICD-10-CM

## 2016-10-18 LAB — URINALYSIS, ROUTINE W REFLEX MICROSCOPIC
BILIRUBIN URINE: NEGATIVE
Bacteria, UA: NONE SEEN
Glucose, UA: NEGATIVE mg/dL
Hgb urine dipstick: NEGATIVE
KETONES UR: NEGATIVE mg/dL
Leukocytes, UA: NEGATIVE
Nitrite: NEGATIVE
PH: 6 (ref 5.0–8.0)
PROTEIN: 30 mg/dL — AB
Specific Gravity, Urine: 1.019 (ref 1.005–1.030)

## 2016-10-18 LAB — BASIC METABOLIC PANEL
Anion gap: 8 (ref 5–15)
BUN: 9 mg/dL (ref 6–20)
CALCIUM: 8.5 mg/dL — AB (ref 8.9–10.3)
CO2: 25 mmol/L (ref 22–32)
CREATININE: 0.98 mg/dL (ref 0.44–1.00)
Chloride: 104 mmol/L (ref 101–111)
GFR calc Af Amer: >60 mL/min (ref >60)
Glucose, Bld: 95 mg/dL (ref 65–99)
Potassium: 3.1 mmol/L — ABNORMAL LOW (ref 3.5–5.1)
SODIUM: 137 mmol/L (ref 135–145)

## 2016-10-18 LAB — CBC WITH DIFFERENTIAL/PLATELET
Basophils Absolute: 0 10*3/uL (ref 0.0–0.1)
Basophils Relative: 0 %
EOS ABS: 0.1 10*3/uL (ref 0.0–0.7)
EOS PCT: 1 %
HCT: 40.7 % (ref 36.0–46.0)
Hemoglobin: 13.3 g/dL (ref 12.0–15.0)
LYMPHS ABS: 2 10*3/uL (ref 0.7–4.0)
Lymphocytes Relative: 23 %
MCH: 27.6 pg (ref 26.0–34.0)
MCHC: 32.7 g/dL (ref 30.0–36.0)
MCV: 84.4 fL (ref 78.0–100.0)
MONOS PCT: 3 %
Monocytes Absolute: 0.3 10*3/uL (ref 0.1–1.0)
Neutro Abs: 6.2 10*3/uL (ref 1.7–7.7)
Neutrophils Relative %: 73 %
PLATELETS: 319 10*3/uL (ref 150–400)
RBC: 4.82 MIL/uL (ref 3.87–5.11)
RDW: 12.8 % (ref 11.5–15.5)
WBC: 8.6 10*3/uL (ref 4.0–10.5)

## 2016-10-18 LAB — PREGNANCY, URINE: Preg Test, Ur: NEGATIVE

## 2016-10-18 MED ORDER — LORAZEPAM 1 MG PO TABS
1.0000 mg | ORAL_TABLET | Freq: Once | ORAL | Status: AC
  Administered 2016-10-18: 1 mg via ORAL
  Filled 2016-10-18: qty 1

## 2016-10-18 MED ORDER — LORAZEPAM 1 MG PO TABS: 1.0000 mg | ORAL_TABLET | Freq: Two times a day (BID) | ORAL | 0 refills | Status: DC | PRN

## 2016-10-18 MED ORDER — POTASSIUM CHLORIDE CRYS ER 20 MEQ PO TBCR
20.0000 meq | EXTENDED_RELEASE_TABLET | Freq: Once | ORAL | Status: AC
  Administered 2016-10-18: 20 meq via ORAL
  Filled 2016-10-18: qty 1

## 2016-10-18 NOTE — ED Provider Notes (Signed)
[facility]-EMERGENCY DEPT Provider Note   CSN: [id number] Arrival date & time: 10/18/16  0810     History   Chief Complaint Chief Complaint  Patient presents with  . Anxiety    HPI Catherine Allen is a 41 y.o. female.  Patient complains of a "panic attack" after dropping her child off at school today. A similar event happened approximately 1 year ago. She describes a sense of restlessness and numbness in the tips of her left hand digits 2, 3, 4. She also has some tingling in her bilateral humerus area.  No chest pain, dyspnea, gross neurological deficits. Past medical history includes hypertension, but no diabetes. Nonsmoker.  Severity is moderate.      Past Medical History:  Diagnosis Date  . Allergy   . Dyspareunia   . Hemorrhoids   . HTN (hypertension)   . MRSA (methicillin resistant Staphylococcus aureus) 2010   boils thighs  . S/P endoscopy June 2012   Dr. Jena Gauss: mild erosive esophagitis, chronic duodenitis, mild gastritis, stop NSAIDs.   . Vaginitis     Patient Active Problem List   Diagnosis Date Noted  . Breast calcifications on mammogram 09/11/2016  . Allergic rhinitis 01/22/2014  . 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 Hyperlipemia 07/09/2009  . Overweight 11/23/2008  . Essential hypertension 09/23/2008    Past Surgical History:  Procedure Laterality Date  . CESAREAN SECTION  2004    OB History    No data available       Home Medications    Prior to Admission medications   Medication Sig Start Date End Date Taking? Authorizing Provider  acyclovir (ZOVIRAX) 400 MG tablet TAKE 1 TABLET BY MOUTH TWICE DAILY 12/03/15  Yes Kerri Perches, MD  ALPRAZolam Prudy Feeler) 0.5 MG tablet TAKE 1 TABLET BY MOUTH ONCE DAILY AS NEEDED FOR EXTREME ANXIETY. PER DOCTOR SIMPSON MUST LAST 30 DAYS 09/19/16  Yes Kerri Perches, MD  amLODipine (NORVASC) 5 MG tablet TAKE 1 TABLET BY MOUTH EVERY DAY 05/29/16  Yes Kerri Perches, MD  QUASENSE  0.15-0.03 MG tablet TAKE 1 TABLET BY MOUTH DAILY 11/22/15  Yes Kerri Perches, MD  triamcinolone (NASACORT AQ) 55 MCG/ACT AERO nasal inhaler Place 2 sprays into the nose daily. 12/27/15  Yes Kerri Perches, MD  triamterene-hydrochlorothiazide (MAXZIDE) 75-50 MG tablet TAKE 1 TABLET BY MOUTH DAILY 11/19/14  Yes Kerri Perches, MD  LORazepam (ATIVAN) 1 MG tablet Take 1 tablet (1 mg total) by mouth 2 (two) times daily as needed for anxiety. 10/18/16   Donnetta Hutching, MD    Family History Family History  Problem Relation Age of Onset  . Hypertension Mother   . Asthma Sister     Social History Social History  Substance Use Topics  . Smoking status: Never Smoker  . Smokeless tobacco: Never Used  . Alcohol use No     Allergies   Patient has no known allergies.   Review of Systems Review of Systems  All other systems reviewed and are negative.    Physical Exam Updated Vital Signs BP 120/76   Pulse 87   Temp 98.8 F (37.1 C) (Oral)   Resp 20   Ht  (1.6 m)   Wt 75.8 kg (167 lb)   LMP 09/10/2016   SpO2 97%   BMI 29.58 kg/m   Physical Exam  Constitutional: She is oriented to person, place, and time. She appears well-developed and well-nourished.  HENT:  Head: Normocephalic and  atraumatic.  Eyes: Conjunctivae are normal.  Neck: Neck supple.  Cardiovascular: Normal rate and regular rhythm.   Pulmonary/Chest: Effort normal and breath sounds normal.  Abdominal: Soft. Bowel sounds are normal.  Musculoskeletal: Normal range of motion.  Neurological: She is alert and oriented to person, place, and time.  Skin: Skin is warm and dry.  Psychiatric: She has a normal mood and affect. Her behavior is normal.  Nursing note and vitals reviewed.    ED Treatments / Results  Labs (all labs ordered are listed, but only abnormal results are displayed) Labs Reviewed  BASIC METABOLIC PANEL - Abnormal; Notable for the following:       Result Value   Potassium 3.1  (*)    Calcium 8.5 (*)    All other components within normal limits  URINALYSIS, ROUTINE W REFLEX MICROSCOPIC - Abnormal; Notable for the following:    Protein, ur 30 (*)    Squamous Epithelial / LPF 0-5 (*)    All other components within normal limits  CBC WITH DIFFERENTIAL/PLATELET  PREGNANCY, URINE    EKG  EKG Interpretation  Date/Time:  Wednesday October 18 2016 09:09:11 EDT Ventricular Rate:  74 PR Interval:    QRS Duration: 79 QT Interval:  384 QTC Calculation: 426 R Axis:   41 Text Interpretation:  Sinus rhythm Borderline T abnormalities, anterior leads Confirmed by Donnetta Hutching ([id number]) on 10/18/2016 10:55:05 AM       Radiology No results found.  Procedures Procedures (including critical care time)  Medications Ordered in ED Medications  LORazepam (ATIVAN) tablet 1 mg (1 mg Oral Given 10/18/16 0901)  potassium chloride SA (K-DUR,KLOR-CON) CR tablet 20 mEq (20 mEq Oral Given 10/18/16 1046)     Initial Impression / Assessment and Plan / ED Course  I have reviewed the triage vital signs and the nursing notes.  Pertinent labs & imaging results that were available during my care of the patient were reviewed by me and considered in my medical decision making (see chart for details).     Risk factors for ACS or CVA are low.  Patient has a normal physical exam.  Screening labs, EKG, urinalysis, pregnancy negative.  Patient felt much better after Ativan 1 mg by mouth. Discharge medications Ativan 1 mg (#10)  Final Clinical Impressions(s) / ED Diagnoses   Final diagnoses:  Tingling  Anxiety    New Prescriptions Discharge Medication List as of 10/18/2016  1:06 PM    START taking these medications   Details  LORazepam (ATIVAN) 1 MG tablet Take 1 tablet (1 mg total) by mouth 2 (two) times daily as needed for anxiety., Starting Wed 10/18/2016, Print         Donnetta Hutching, MD 10/18/16 [phone]

## 2016-10-18 NOTE — Discharge Instructions (Signed)
Tests showed no life-threatening condition. No evidence of a stroke. Medication for restlessness.

## 2016-10-18 NOTE — Telephone Encounter (Signed)
Completed, referred

## 2016-10-18 NOTE — ED Triage Notes (Signed)
Pt reports sudden onset anxiety this am. Pt denies chest pain. Pt tearful in triage. Pt reports numbness in BUE since this am. Pt reports intermittent numbness in bilateral hands x1 week. Moderate anxiety noted at this time.

## 2016-10-18 NOTE — Telephone Encounter (Signed)
Tele call made to pt and currently in the eD states she had a panic attack C/o 1 week h/o numbness in fingertips,  Is in agreement with neurology to further evaluate , will refer

## 2016-10-25 ENCOUNTER — Telehealth: Payer: Self-pay | Admitting: Family Medicine

## 2016-10-25 DIAGNOSIS — E785 Hyperlipidemia, unspecified: Secondary | ICD-10-CM

## 2016-10-25 NOTE — Telephone Encounter (Signed)
Patient was experiencing a panic attack last Wednesday Oct 10th.  She went to [facility] ER and was told to f/u with Dr. Lodema HongSimpson. They told her that her potassium was low. She had another episode this past Friday and yesterday she did not feel well and took the Rx Lorazepam given to her in the ER.  She is concerned that BP Rx may be causing her these symptoms.  She also has a concern with urine output.  Please advise. No available time slots this week or following.

## 2016-10-26 ENCOUNTER — Other Ambulatory Visit: Payer: Self-pay | Admitting: Family Medicine

## 2016-10-26 MED ORDER — POTASSIUM CHLORIDE ER 10 MEQ PO TBCR: 10.0000 meq | EXTENDED_RELEASE_TABLET | Freq: Two times a day (BID) | ORAL | 2 refills | Status: DC

## 2016-10-26 NOTE — Telephone Encounter (Signed)
Please call and give this patient an appointment on November 13, and put her on the schedule or ask for help to do so, I just spoke with her, based on number of pts , I can see her, also pls mail her a lab sheet to get fasting chem 7 3 t 5 days before, dx Is hypertension and low potassium. I prescribed potassium twice daily for her and she is aware  Thanks

## 2016-10-27 NOTE — Telephone Encounter (Signed)
Done

## 2016-11-16 ENCOUNTER — Encounter: Payer: Self-pay | Admitting: Family Medicine

## 2016-11-16 LAB — COMPLETE METABOLIC PANEL WITH GFR
AG RATIO: 1.4 (calc) (ref 1.0–2.5)
ALT: 14 U/L (ref 6–29)
AST: 14 U/L (ref 10–30)
Albumin: 3.8 g/dL (ref 3.6–5.1)
Alkaline phosphatase (APISO): 55 U/L (ref 33–115)
BUN: 10 mg/dL (ref 7–25)
CALCIUM: 8.7 mg/dL (ref 8.6–10.2)
CO2: 26 mmol/L (ref 20–32)
Chloride: 103 mmol/L (ref 98–110)
Creat: 0.98 mg/dL (ref 0.50–1.10)
GFR, EST AFRICAN AMERICAN: 82 mL/min/{1.73_m2} (ref >=60)
GFR, EST NON AFRICAN AMERICAN: 71 mL/min/{1.73_m2} (ref >=60)
GLOBULIN: 2.8 g/dL (ref 1.9–3.7)
Glucose, Bld: 89 mg/dL (ref 65–99)
POTASSIUM: 3.5 mmol/L (ref 3.5–5.3)
SODIUM: 138 mmol/L (ref 135–146)
TOTAL PROTEIN: 6.6 g/dL (ref 6.1–8.1)
Total Bilirubin: 0.5 mg/dL (ref 0.2–1.2)

## 2016-11-21 ENCOUNTER — Encounter: Payer: Self-pay | Admitting: Family Medicine

## 2016-11-21 ENCOUNTER — Ambulatory Visit: Payer: Medicaid Other | Admitting: Family Medicine

## 2016-11-21 VITALS — BP 108/80 | HR 80 | Temp 98.8°F | Resp 16 | Ht 63.0 in | Wt 165.0 lb

## 2016-11-21 DIAGNOSIS — I10 Essential (primary) hypertension: Secondary | ICD-10-CM | POA: Diagnosis not present

## 2016-11-21 DIAGNOSIS — E663 Overweight: Secondary | ICD-10-CM | POA: Diagnosis not present

## 2016-11-21 DIAGNOSIS — F410 Panic disorder [episodic paroxysmal anxiety] without agoraphobia: Secondary | ICD-10-CM

## 2016-11-21 DIAGNOSIS — J3089 Other allergic rhinitis: Secondary | ICD-10-CM | POA: Diagnosis not present

## 2016-11-21 MED ORDER — LORAZEPAM 1 MG PO TABS: 1.0000 mg | ORAL_TABLET | Freq: Two times a day (BID) | ORAL | 0 refills | Status: DC | PRN

## 2016-11-21 MED ORDER — BENZONATATE 100 MG PO CAPS: 100.0000 mg | ORAL_CAPSULE | Freq: Two times a day (BID) | ORAL | 0 refills | Status: DC | PRN

## 2016-11-21 MED ORDER — PREDNISONE 5 MG PO TABS: 5.0000 mg | ORAL_TABLET | Freq: Two times a day (BID) | ORAL | 0 refills | Status: AC

## 2016-11-21 NOTE — Patient Instructions (Addendum)
F/u in January , call if ypu need me before  Continue potassium  Decongestant and prednisone are prescribed for cough  Ativan for ise ONLY if in panic ,script  expected to last for 4 to 6 months, no refills are wrirtten  It is important that you exercise regularly at least 30 minutes 5 times a week. If you develop chest pain, have severe difficulty breathing, or feel very tired, stop exercising immediately and seek medical attention   Please work on good  health habits so that your health will improve. 1. Commitment to daily physical activity for 30 to 60  minutes, if you are able to do this.  2. Commitment to wise food choices. Aim for half of your  food intake to be vegetable and fruit, one quarter starchy foods, and one quarter protein. Try to eat on a regular schedule  3 meals per day, snacking between meals should be limited to vegetables or fruits or small portions of nuts. 64 ounces of water per day is generally recommended, unless you have specific health conditions, like heart failure or kidney failure where you will need to limit fluid intake.  3. Commitment to sufficient and a  good quality of physical and mental rest daily, generally between 6 to 8 hours per day.  WITH PERSISTANCE AND PERSEVERANCE, THE IMPOSSIBLE , BECOMES THE NORM!   Thank you  for choosing [facility]. We consider it a privelige to serve you.  Delivering excellent health care in a caring and  compassionate way is our goal.  Partnering with you,  so that together we can achieve this goal is our strategy.

## 2016-11-21 NOTE — Progress Notes (Signed)
Catherine Allen     MRN: [id number]      DOB: 03/16/1975   HPI Catherine Allen is here for follow up and re-evaluation of chronic medical conditions, medication management and review of any available recent lab and radiology data.  Preventive health is updated, specifically  Cancer screening and Immunization.   Questions or concerns regarding consultations or procedures which the PT has had in the interim are  addressed. The PT denies any adverse reactions to current medications since the last visit.  1 week h/o cough productive of yellow sputum, feels as though most of this drains down overnight , takes bedtime cough suppressant , uses robitussin to loos en the cough Has had 2 panic attacks in  Past 1 year starting Oct 2017, was recently I ED, Ativan more useful than xanax, will presumptively prescribe an additional 10 for next 4 to 6  months  ROS Denies recent fever or chills. Denies sinus pressure, nasal congestion, ear pain or sore throat. Denies chest pains, palpitations and leg swelling Denies abdominal pain, nausea, vomiting,diarrhea or constipation.   Denies dysuria, frequency, hesitancy or incontinence. Denies joint pain, swelling and limitation in mobility. Denies headaches, seizures, numbness, or tingling.  Denies skin break down or rash.   PE  BP 108/80   Pulse 80   Temp 98.8 F (37.1 C) (Oral)   Resp 16   Ht 5\' 3"  (1.6 m)   Wt 165 lb (74.8 kg)   SpO2 98%   BMI 29.23 kg/m   Patient alert and oriented and in no cardiopulmonary distress.  HEENT: No facial asymmetry, EOMI,   oropharynx pink and moist.  Neck supple no JVD, no mass.  Chest: adequate air entry , few crackles no wheezes  CVS: S1, S2 no murmurs, no S3.Regular rate.  ABD: Soft non tender.   Ext: No edema  MS: Adequate ROM spine, shoulders, hips and knees.  Skin: Intact, no ulcerations or rash noted.  Psych: Good eye contact, normal affect. Memory intact not anxious or depressed  appearing.  CNS: CN 2-12 intact, power,  normal throughout.no focal deficits noted.   Assessment & Plan  Allergic rhinitis Uncontrolled with cough, prednisone and tessalon perles prescribed  Essential hypertension Controlled, no change in medication DASH diet and commitment to daily physical activity for a minimum of 30 minutes discussed and encouraged, as a part of hypertension management. The importance of attaining a healthy weight is also discussed.  BP/Weight 11/21/2016 10/18/2016 09/06/2016 03/23/2016 12/27/2015 11/24/2015 07/14/2015  Systolic BP 108 120 128 122 122 122 128  Diastolic BP 80 76 82 80 82 82 82  Wt. (Lbs) 165 167 166 163 - 168.4 168  BMI 29.23 29.58 28.49 27.98 - 28.91 28.82       Panic attacks Two episodes in a 12 month period, the most recent necessitated an Ed visit, good response to ativan,  Educated agin re behavioral management of anxiety and panic, script for 10 ativan provided expected to last for 6 months   Overweight Unchanged Patient re-educated about  the importance of commitment to a  minimum of 150 minutes of exercise per week.  The importance of healthy food choices with portion control discussed. Encouraged to start a food diary, count calories and to consider  joining a support group. Sample diet sheets offered. Goals set by the patient for the next several months.   Weight /BMI 11/21/2016 10/18/2016 09/06/2016  WEIGHT 165 lb 167 lb 166 lb  HEIGHT 5\' 3"  5\' 3"   5\' 4"   BMI 29.23 kg/m2 29.58 kg/m2 28.49 kg/m2

## 2016-11-22 ENCOUNTER — Encounter: Payer: Self-pay | Admitting: Family Medicine

## 2016-11-22 DIAGNOSIS — F410 Panic disorder [episodic paroxysmal anxiety] without agoraphobia: Secondary | ICD-10-CM | POA: Insufficient documentation

## 2016-11-22 NOTE — Assessment & Plan Note (Signed)
Uncontrolled with cough, prednisone and tessalon perles prescribed

## 2016-11-22 NOTE — Assessment & Plan Note (Signed)
Controlled, no change in medication DASH diet and commitment to daily physical activity for a minimum of 30 minutes discussed and encouraged, as a part of hypertension management. The importance of attaining a healthy weight is also discussed.  BP/Weight 11/21/2016 10/18/2016 09/06/2016 03/23/2016 12/27/2015 11/24/2015 07/14/2015  Systolic BP 108 120 128 122 122 122 128  Diastolic BP 80 76 82 80 82 82 82  Wt. (Lbs) 165 167 166 163 - 168.4 168  BMI 29.23 29.58 28.49 27.98 - 28.91 28.82

## 2016-11-22 NOTE — Assessment & Plan Note (Signed)
Unchanged Patient re-educated about  the importance of commitment to a  minimum of 150 minutes of exercise per week.  The importance of healthy food choices with portion control discussed. Encouraged to start a food diary, count calories and to consider  joining a support group. Sample diet sheets offered. Goals set by the patient for the next several months.   Weight /BMI 11/21/2016 10/18/2016 09/06/2016  WEIGHT 165 lb 167 lb 166 lb  HEIGHT 5\' 3"  5\' 3"  5\' 4"   BMI 29.23 kg/m2 29.58 kg/m2 28.49 kg/m2

## 2016-11-22 NOTE — Assessment & Plan Note (Signed)
Two episodes in a 12 month period, the most recent necessitated an Ed visit, good response to ativan,  Educated agin re behavioral management of anxiety and panic, script for 10 ativan provided expected to last for 6 months

## 2016-11-25 ENCOUNTER — Other Ambulatory Visit: Payer: Self-pay | Admitting: Family Medicine

## 2016-12-05 ENCOUNTER — Telehealth: Payer: Self-pay | Admitting: *Deleted

## 2016-12-05 ENCOUNTER — Other Ambulatory Visit: Payer: Self-pay | Admitting: Family Medicine

## 2016-12-05 MED ORDER — BUSPIRONE HCL 5 MG PO TABS: 5.0000 mg | ORAL_TABLET | Freq: Two times a day (BID) | ORAL | 3 refills | Status: DC

## 2016-12-05 NOTE — Telephone Encounter (Signed)
I spoke directly with the pt and have started buspar twice daily she is aware, she has a Jan appt

## 2016-12-05 NOTE — Telephone Encounter (Signed)
Patient called stating she needs to take something everyday for her panic attacks, and this morning she had 2. Per patient her and Dr Lodema HongSimpson discussed this last time she was here. [phone]

## 2016-12-12 ENCOUNTER — Encounter (HOSPITAL_COMMUNITY): Payer: Self-pay

## 2016-12-12 ENCOUNTER — Ambulatory Visit (HOSPITAL_COMMUNITY)
Admission: RE | Admit: 2016-12-12 | Discharge: 2016-12-12 | Disposition: A | Discharge status: Home / Self Care | Payer: Medicaid Other | Source: Ambulatory Visit | Attending: Family Medicine | Admitting: Family Medicine

## 2016-12-12 DIAGNOSIS — R921 Mammographic calcification found on diagnostic imaging of breast: Secondary | ICD-10-CM | POA: Diagnosis not present

## 2016-12-12 DIAGNOSIS — N6489 Other specified disorders of breast: Secondary | ICD-10-CM | POA: Diagnosis present

## 2017-01-27 ENCOUNTER — Other Ambulatory Visit: Payer: Self-pay | Admitting: Family Medicine

## 2017-02-01 LAB — BASIC METABOLIC PANEL WITH GFR
BUN: 13 mg/dL (ref 7–25)
CALCIUM: 9.1 mg/dL (ref 8.6–10.2)
CHLORIDE: 103 mmol/L (ref 98–110)
CO2: 27 mmol/L (ref 20–32)
CREATININE: 1.09 mg/dL (ref 0.50–1.10)
GFR, EST AFRICAN AMERICAN: 73 mL/min/{1.73_m2} (ref >=60)
GFR, EST NON AFRICAN AMERICAN: 63 mL/min/{1.73_m2} (ref >=60)
Glucose, Bld: 97 mg/dL (ref 65–99)
Potassium: 3.6 mmol/L (ref 3.5–5.3)
Sodium: 138 mmol/L (ref 135–146)

## 2017-02-01 LAB — LIPID PANEL
CHOLESTEROL: 240 mg/dL — AB (ref <200)
HDL: 46 mg/dL — AB (ref >50)
LDL Cholesterol (Calc): 170 mg/dL (calc) — ABNORMAL HIGH
NON-HDL CHOLESTEROL (CALC): 194 mg/dL — AB (ref <130)
Total CHOL/HDL Ratio: 5.2 (calc) — ABNORMAL HIGH (ref <5.0)
Triglycerides: 109 mg/dL (ref <150)

## 2017-02-06 ENCOUNTER — Encounter: Payer: Self-pay | Admitting: Family Medicine

## 2017-02-06 ENCOUNTER — Telehealth: Payer: Self-pay | Admitting: Family Medicine

## 2017-02-06 ENCOUNTER — Other Ambulatory Visit: Payer: Self-pay

## 2017-02-06 ENCOUNTER — Ambulatory Visit: Payer: Medicaid Other | Admitting: Family Medicine

## 2017-02-06 VITALS — BP 122/76 | HR 81 | Temp 98.6°F | Resp 16 | Ht 63.0 in | Wt 164.5 lb

## 2017-02-06 DIAGNOSIS — I10 Essential (primary) hypertension: Secondary | ICD-10-CM

## 2017-02-06 DIAGNOSIS — E785 Hyperlipidemia, unspecified: Secondary | ICD-10-CM

## 2017-02-06 DIAGNOSIS — E663 Overweight: Secondary | ICD-10-CM

## 2017-02-06 DIAGNOSIS — F410 Panic disorder [episodic paroxysmal anxiety] without agoraphobia: Secondary | ICD-10-CM

## 2017-02-06 DIAGNOSIS — F411 Generalized anxiety disorder: Secondary | ICD-10-CM | POA: Diagnosis not present

## 2017-02-06 MED ORDER — LORAZEPAM 1 MG PO TABS: 1.0000 mg | ORAL_TABLET | Freq: Two times a day (BID) | ORAL | 0 refills | Status: DC | PRN

## 2017-02-06 MED ORDER — TRIAMTERENE-HCTZ 75-50 MG PO TABS: 1.0000 | ORAL_TABLET | Freq: Every day | ORAL | 3 refills | Status: DC

## 2017-02-06 MED ORDER — PRAVASTATIN SODIUM 20 MG PO TABS: 20.0000 mg | ORAL_TABLET | Freq: Every day | ORAL | 5 refills | Status: DC

## 2017-02-06 NOTE — Assessment & Plan Note (Signed)
Improved, has 1 p lorazepam left and is taking buspar twice daily

## 2017-02-06 NOTE — Patient Instructions (Addendum)
CPE and and pap end May or early June , call if you ned me sooner  New for cholesterol is pravstatin one at bedtime  Please lay low on cheese and fried / fatttty foods , change to beans and egg white over animal protein   CBC, fasting lipid, cmp and EGFR, tSH and vit D 1 week before next visit  It is important that you exercise regularly at least 30 minutes 5 times a week. If you develop chest pain, have severe difficulty breathing, or feel very tired, stop exercising immediately and seek medical attention   Thank you  for choosing [facility]. We consider it a privelige to serve you.  Delivering excellent health care in a caring and  compassionate way is our goal.  Partnering with you,  so that together we can achieve this goal is our strategy.

## 2017-02-06 NOTE — Assessment & Plan Note (Signed)
Deteriorated and uncontrolled Start pravachol Updated lab needed at/ before next visit.

## 2017-02-06 NOTE — Progress Notes (Signed)
   Catherine Allen     MRN: [id number]      DOB: 08/06/1975   HPI [name] is here for follow up and re-evaluation of chronic medical conditions, medication management and review of any available recent lab and radiology data.  Preventive health is updated, specifically  Cancer screening and Immunization.   Questions or concerns regarding consultations or procedures which the PT has had in the interim are  addressed. The PT denies any adverse reactions to current medications since the last visit.  There are no new concerns.  There are no specific complaints   ROS Denies recent fever or chills. Denies sinus pressure, nasal congestion, ear pain or sore throat. Denies chest congestion, productive cough or wheezing. Denies chest pains, palpitations and leg swelling Denies abdominal pain, nausea, vomiting,diarrhea or constipation.   Denies dysuria, frequency, hesitancy or incontinence. Denies joint pain, swelling and limitation in mobility. Denies headaches, seizures, numbness, or tingling. Denies depression, uncontrolled anxiety or insomnia. Denies skin break down or rash.   PE  BP 122/76 (BP Location: Left Arm, Patient Position: Sitting, Cuff Size: Normal)   Pulse 81   Temp 98.6 F (37 C) (Temporal)   Resp 16   Ht 5\' 3"  (1.6 m)   Wt 164 lb 8 oz (74.6 kg)   SpO2 99%   BMI 29.14 kg/m   Patient alert and oriented and in no cardiopulmonary distress.  HEENT: No facial asymmetry, EOMI,   oropharynx pink and moist.  Neck supple no JVD, no mass.  Chest: Clear to auscultation bilaterally.  CVS: S1, S2 no murmurs, no S3.Regular rate.  ABD: Soft non tender.   Ext: No edema  MS: Adequate ROM spine, shoulders, hips and knees.  Skin: Intact, no ulcerations or rash noted.  Psych: Good eye contact, normal affect. Memory intact not anxious or depressed appearing.  CNS: CN 2-12 intact, power,  normal throughout.no focal deficits noted.   Assessment & Plan  Essential  hypertension Controlled, no change in medication DASH diet and commitment to daily physical activity for a minimum of 30 minutes discussed and encouraged, as a part of hypertension management. The importance of attaining a healthy weight is also discussed.  BP/Weight 02/06/2017 11/21/2016 10/18/2016 09/06/2016 03/23/2016 12/27/2015 11/24/2015  Systolic BP 122 108 120 128 122 122 122  Diastolic BP 76 80 76 82 80 82 82  Wt. (Lbs) 164.5 165 167 166 163 - 168.4  BMI 29.14 29.23 29.58 28.49 27.98 - 28.91       Hyperlipemia Deteriorated and uncontrolled Start pravachol Updated lab needed at/ before next visit.   Overweight unchanged Patient re-educated about  the importance of commitment to a  minimum of 150 minutes of exercise per week.  The importance of healthy food choices with portion control discussed. Encouraged to start a food diary, count calories and to consider  joining a support group. Sample diet sheets offered. Goals set by the patient for the next several months.   Weight /BMI 02/06/2017 11/21/2016 10/18/2016  WEIGHT 164 lb 8 oz 165 lb 167 lb  HEIGHT 5\' 3"  5\' 3"  5\' 3"   BMI 29.14 kg/m2 29.23 kg/m2 29.58 kg/m2      Panic attacks Improved, has 1 p lorazepam left and is taking buspar twice daily  GAD (generalized anxiety disorder) Controlled, no change in medication

## 2017-02-06 NOTE — Assessment & Plan Note (Signed)
Controlled, no change in medication  

## 2017-02-06 NOTE — Assessment & Plan Note (Signed)
unchanged Patient re-educated about  the importance of commitment to a  minimum of 150 minutes of exercise per week.  The importance of healthy food choices with portion control discussed. Encouraged to start a food diary, count calories and to consider  joining a support group. Sample diet sheets offered. Goals set by the patient for the next several months.   Weight /BMI 02/06/2017 11/21/2016 10/18/2016  WEIGHT 164 lb 8 oz 165 lb 167 lb  HEIGHT 5\' 3"  5\' 3"  5\' 3"   BMI 29.14 kg/m2 29.23 kg/m2 29.58 kg/m2

## 2017-02-06 NOTE — Assessment & Plan Note (Signed)
Controlled, no change in medication DASH diet and commitment to daily physical activity for a minimum of 30 minutes discussed and encouraged, as a part of hypertension management. The importance of attaining a healthy weight is also discussed.  BP/Weight 02/06/2017 11/21/2016 10/18/2016 09/06/2016 03/23/2016 12/27/2015 11/24/2015  Systolic BP 122 108 120 128 122 122 122  Diastolic BP 76 80 76 82 80 82 82  Wt. (Lbs) 164.5 165 167 166 163 - 168.4  BMI 29.14 29.23 29.58 28.49 27.98 - 28.91

## 2017-02-06 NOTE — Telephone Encounter (Signed)
Labs ordered per PCP.

## 2017-03-03 ENCOUNTER — Other Ambulatory Visit: Payer: Self-pay | Admitting: Family Medicine

## 2017-04-01 ENCOUNTER — Other Ambulatory Visit: Payer: Self-pay | Admitting: Family Medicine

## 2017-04-05 ENCOUNTER — Other Ambulatory Visit: Payer: Self-pay | Admitting: Family Medicine

## 2017-05-01 ENCOUNTER — Other Ambulatory Visit: Payer: Self-pay | Admitting: Family Medicine

## 2017-05-03 ENCOUNTER — Other Ambulatory Visit: Payer: Self-pay | Admitting: Family Medicine

## 2017-06-01 ENCOUNTER — Other Ambulatory Visit: Payer: Self-pay | Admitting: Family Medicine

## 2017-06-02 LAB — COMPLETE METABOLIC PANEL WITH GFR
AG Ratio: 1.6 (calc) (ref 1.0–2.5)
ALBUMIN MSPROF: 3.9 g/dL (ref 3.6–5.1)
ALT: 11 U/L (ref 6–29)
AST: 15 U/L (ref 10–30)
Alkaline phosphatase (APISO): 53 U/L (ref 33–115)
BUN: 10 mg/dL (ref 7–25)
CALCIUM: 8.8 mg/dL (ref 8.6–10.2)
CO2: 27 mmol/L (ref 20–32)
CREATININE: 1.03 mg/dL (ref 0.50–1.10)
Chloride: 105 mmol/L (ref 98–110)
GFR, EST NON AFRICAN AMERICAN: 67 mL/min/{1.73_m2} (ref >=60)
GFR, Est African American: 78 mL/min/{1.73_m2} (ref >=60)
GLOBULIN: 2.4 g/dL (ref 1.9–3.7)
GLUCOSE: 91 mg/dL (ref 65–99)
Potassium: 3.8 mmol/L (ref 3.5–5.3)
Sodium: 139 mmol/L (ref 135–146)
Total Bilirubin: 0.5 mg/dL (ref 0.2–1.2)
Total Protein: 6.3 g/dL (ref 6.1–8.1)

## 2017-06-02 LAB — CBC
HCT: 41.3 % (ref 35.0–45.0)
Hemoglobin: 13.5 g/dL (ref 11.7–15.5)
MCH: 27.3 pg (ref 27.0–33.0)
MCHC: 32.7 g/dL (ref 32.0–36.0)
MCV: 83.4 fL (ref 80.0–100.0)
MPV: 10.4 fL (ref 7.5–12.5)
PLATELETS: 336 10*3/uL (ref 140–400)
RBC: 4.95 10*6/uL (ref 3.80–5.10)
RDW: 11.6 % (ref 11.0–15.0)
WBC: 7.8 10*3/uL (ref 3.8–10.8)

## 2017-06-02 LAB — VITAMIN D 25 HYDROXY (VIT D DEFICIENCY, FRACTURES): VIT D 25 HYDROXY: 49 ng/mL (ref 30–100)

## 2017-06-02 LAB — LIPID PANEL
CHOLESTEROL: 171 mg/dL (ref <200)
HDL: 43 mg/dL — ABNORMAL LOW (ref >50)
LDL Cholesterol (Calc): 109 mg/dL (calc) — ABNORMAL HIGH
Non-HDL Cholesterol (Calc): 128 mg/dL (calc) (ref <130)
Total CHOL/HDL Ratio: 4 (calc) (ref <5.0)
Triglycerides: 91 mg/dL (ref <150)

## 2017-06-02 LAB — TSH: TSH: 1.11 m[IU]/L

## 2017-06-06 ENCOUNTER — Ambulatory Visit (INDEPENDENT_AMBULATORY_CARE_PROVIDER_SITE_OTHER): Payer: Medicaid Other | Admitting: Family Medicine

## 2017-06-06 ENCOUNTER — Other Ambulatory Visit (HOSPITAL_COMMUNITY)
Admission: RE | Admit: 2017-06-06 | Discharge: 2017-06-06 | Disposition: A | Discharge status: Home / Self Care | Payer: Medicaid Other | Source: Ambulatory Visit | Attending: Family Medicine | Admitting: Family Medicine

## 2017-06-06 ENCOUNTER — Encounter: Payer: Self-pay | Admitting: Family Medicine

## 2017-06-06 VITALS — BP 118/84 | HR 85 | Resp 16 | Ht 63.0 in | Wt 163.8 lb

## 2017-06-06 DIAGNOSIS — I10 Essential (primary) hypertension: Secondary | ICD-10-CM

## 2017-06-06 DIAGNOSIS — Z0000 Encounter for general adult medical examination without abnormal findings: Secondary | ICD-10-CM | POA: Diagnosis not present

## 2017-06-06 DIAGNOSIS — Z124 Encounter for screening for malignant neoplasm of cervix: Secondary | ICD-10-CM | POA: Diagnosis present

## 2017-06-06 DIAGNOSIS — H543 Unqualified visual loss, both eyes: Secondary | ICD-10-CM | POA: Diagnosis not present

## 2017-06-06 NOTE — Assessment & Plan Note (Signed)
Annual exam as documented. Counseling done  re healthy lifestyle involving commitment to 150 minutes exercise per week, heart healthy diet, and attaining healthy weight.The importance of adequate sleep also discussed.  Immunization and cancer screening needs are specifically addressed at this visit.  

## 2017-06-06 NOTE — Progress Notes (Signed)
    Catherine Allen     MRN: [id number]      DOB: Jeilyn 23, 1977  HPI: Patient is in for annual physical exam. Labs reviewed and are excellent  Immunization is reviewed , and  Is up to date Cancer screening is addressed, will return stool cards Needs referral for formal eye exam due to change in vision   PE: BP 118/84   Pulse 85   Resp 16   Ht  (1.6 m)   Wt 163 lb 12.8 oz (74.3 kg)   LMP 02/23/2017 (Approximate)   SpO2 99%   BMI 29.02 kg/m   Pleasant  female, alert and oriented x 3, in no cardio-pulmonary distress. Afebrile. HEENT No facial trauma or asymetry. Sinuses non tender.  Extra occullar muscles intact, External ears normal,  Oropharynx moist, no exudate. Neck: supple, no adenopathy,JVD or thyromegaly.No bruits.  Chest: Clear to ascultation bilaterally.No crackles or wheezes. Non tender to palpation  Breast: No asymetry,no masses or lumps. No tenderness. No nipple discharge or inversion. No axillary or supraclavicular adenopathy  Cardiovascular system; Heart sounds normal,  S1 and  S2 ,no S3.  No murmur, or thrill. Apical beat not displaced Peripheral pulses normal.  Abdomen: Soft, non tender, no organomegaly or masses. No bruits. Bowel sounds normal. No guarding, tenderness or rebound.  Rectal:  Deferred, will return stool cards GU: External genitalia normal female genitalia , normal female distribution of hair. No lesions. Urethral meatus normal in size, no  Prolapse, no lesions visibly  Present. Bladder non tender. Vagina pink and moist , with no visible lesions , physiologic discharge present . Adequate pelvic support no  cystocele or rectocele noted Cervix pink and appears healthy, no lesions or ulcerations noted, physiologic  discharge noted from os, scant blood from os. Uterus normal size, no adnexal masses, no cervical motion or adnexal tenderness.   Musculoskeletal exam: Full ROM of spine, hips ,  and knees.slightly reduced in right  shoulder due to pain No deformity ,swelling or crepitus noted. No muscle wasting or atrophy.   Neurologic: Cranial nerves 2 to 12 intact. Power, tone ,sensation and reflexes normal throughout. No disturbance in gait. No tremor.  Skin: Intact, no ulceration, erythema , scaling or rash noted. Pigmentation normal throughout  Psych; Normal mood and affect. Judgement and concentration normal   Assessment & Plan:  Annual physical exam Annual exam as documented. Counseling done  re healthy lifestyle involving commitment to 150 minutes exercise per week, heart healthy diet, and attaining healthy weight.The importance of adequate sleep also discussed. . Immunization and cancer screening needs are specifically addressed at this visit.   Essential hypertension Controlled, no change in medication DASH diet and commitment to daily physical activity for a minimum of 30 minutes discussed and encouraged, as a part of hypertension management. The importance of attaining a healthy weight is also discussed.  BP/Weight 06/06/2017 02/06/2017 11/21/2016 10/18/2016 09/06/2016 03/23/2016 12/27/2015  Systolic BP 118 122 108 120 128 122 122  Diastolic BP 84 76 80 76 82 80 82  Wt. (Lbs) 163.8 164.5 165 167 166 163 -  BMI 29.02 29.14 29.23 29.58 28.49 27.98 -       Decreased vision in both eyes Decreased visoion in both eyes in the past year , refer for formal testing

## 2017-06-06 NOTE — Patient Instructions (Addendum)
F/U in 5.5 month, call if you need me sooner  Fasting lipid , cmp and EGFR 1 week before follow up You are referred for vision testing due to change in vision Pap is sent  Wor kn 5 to 6 pound weight loss please  Please return 3 stool cards  For colon cancer screening Congrats on excelent labs, cholesterol now much better, no changwe in medication  Tylenol ES one twice daily for 3 to 5 days will help with right shoulder pain  It is important that you exercise regularly at least 30 minutes 5 times a week. If you develop chest pain, have severe difficulty breathing, or feel very tired, stop exercising immediately and seek medical attention  Please work on good  health habits so that your health will improve. 1. Commitment to daily physical activity for 30 to 60  minutes, if you are able to do this.  2. Commitment to wise food choices. Aim for half of your  food intake to be vegetable and fruit, one quarter starchy foods, and one quarter protein. Try to eat on a regular schedule  3 meals per day, snacking between meals should be limited to vegetables or fruits or small portions of nuts. 64 ounces of water per day is generally recommended, unless you have specific health conditions, like heart failure or kidney failure where you will need to limit fluid intake.  3. Commitment to sufficient and a  good quality of physical and mental rest daily, generally between 6 to 8 hours per day.  WITH PERSISTANCE AND PERSEVERANCE, THE IMPOSSIBLE , BECOMES THE NORM!

## 2017-06-06 NOTE — Assessment & Plan Note (Signed)
Controlled, no change in medication DASH diet and commitment to daily physical activity for a minimum of 30 minutes discussed and encouraged, as a part of hypertension management. The importance of attaining a healthy weight is also discussed.  BP/Weight 06/06/2017 02/06/2017 11/21/2016 10/18/2016 09/06/2016 03/23/2016 12/27/2015  Systolic BP 118 122 108 120 128 122 122  Diastolic BP 84 76 80 76 82 80 82  Wt. (Lbs) 163.8 164.5 165 167 166 163 -  BMI 29.02 29.14 29.23 29.58 28.49 27.98 -

## 2017-06-08 ENCOUNTER — Encounter: Payer: Self-pay | Admitting: Family Medicine

## 2017-06-08 DIAGNOSIS — H543 Unqualified visual loss, both eyes: Secondary | ICD-10-CM | POA: Insufficient documentation

## 2017-06-08 LAB — CYTOLOGY - PAP
Diagnosis: NEGATIVE
HPV: NOT DETECTED

## 2017-06-08 NOTE — Assessment & Plan Note (Signed)
Decreased visoion in both eyes in the past year , refer for formal testing

## 2017-07-08 ENCOUNTER — Other Ambulatory Visit: Payer: Self-pay | Admitting: Family Medicine

## 2017-07-16 ENCOUNTER — Other Ambulatory Visit: Payer: Self-pay | Admitting: Family Medicine

## 2017-07-20 ENCOUNTER — Telehealth: Payer: Self-pay | Admitting: Family Medicine

## 2017-07-20 ENCOUNTER — Other Ambulatory Visit: Payer: Self-pay

## 2017-07-20 MED ORDER — LORAZEPAM 1 MG PO TABS: ORAL_TABLET | ORAL | 0 refills | Status: DC

## 2017-07-20 NOTE — Telephone Encounter (Signed)
Patient is requesting refill of lorazepam.  She called in refill on 07/16/17 to the pharmacy. She has heard nothing back from the office & neither has the pharmacy. Please advise. Cb#: [id number]/ [id number]

## 2017-07-20 NOTE — Telephone Encounter (Signed)
Lorazepam refilled and printed for Dr.Simpson to sign.

## 2017-08-02 DIAGNOSIS — H5213 Myopia, bilateral: Secondary | ICD-10-CM | POA: Diagnosis not present

## 2017-08-03 DIAGNOSIS — H5213 Myopia, bilateral: Secondary | ICD-10-CM | POA: Diagnosis not present

## 2017-08-05 ENCOUNTER — Other Ambulatory Visit: Payer: Self-pay | Admitting: Family Medicine

## 2017-08-14 ENCOUNTER — Encounter: Payer: Self-pay | Admitting: Family Medicine

## 2017-08-15 ENCOUNTER — Other Ambulatory Visit: Payer: Self-pay | Admitting: Family Medicine

## 2017-08-15 ENCOUNTER — Ambulatory Visit (INDEPENDENT_AMBULATORY_CARE_PROVIDER_SITE_OTHER): Payer: Medicaid Other | Admitting: Family Medicine

## 2017-08-15 ENCOUNTER — Encounter: Payer: Self-pay | Admitting: Family Medicine

## 2017-08-15 ENCOUNTER — Ambulatory Visit (HOSPITAL_COMMUNITY)
Admission: RE | Admit: 2017-08-15 | Discharge: 2017-08-15 | Disposition: A | Discharge status: Home / Self Care | Payer: Medicaid Other | Source: Ambulatory Visit | Attending: Family Medicine | Admitting: Family Medicine

## 2017-08-15 VITALS — BP 130/80 | HR 81 | Resp 16 | Ht 63.0 in | Wt 167.0 lb

## 2017-08-15 DIAGNOSIS — M25511 Pain in right shoulder: Secondary | ICD-10-CM | POA: Insufficient documentation

## 2017-08-15 DIAGNOSIS — M4184 Other forms of scoliosis, thoracic region: Secondary | ICD-10-CM | POA: Diagnosis not present

## 2017-08-15 DIAGNOSIS — M50321 Other cervical disc degeneration at C4-C5 level: Secondary | ICD-10-CM | POA: Diagnosis not present

## 2017-08-15 DIAGNOSIS — I10 Essential (primary) hypertension: Secondary | ICD-10-CM

## 2017-08-15 DIAGNOSIS — M62838 Other muscle spasm: Secondary | ICD-10-CM | POA: Diagnosis not present

## 2017-08-15 DIAGNOSIS — E663 Overweight: Secondary | ICD-10-CM

## 2017-08-15 DIAGNOSIS — G8929 Other chronic pain: Secondary | ICD-10-CM | POA: Diagnosis not present

## 2017-08-15 DIAGNOSIS — M542 Cervicalgia: Secondary | ICD-10-CM | POA: Insufficient documentation

## 2017-08-15 DIAGNOSIS — M546 Pain in thoracic spine: Secondary | ICD-10-CM | POA: Insufficient documentation

## 2017-08-15 MED ORDER — RANITIDINE HCL 300 MG PO TABS: 300.0000 mg | ORAL_TABLET | Freq: Every day | ORAL | 0 refills | Status: DC

## 2017-08-15 MED ORDER — CYCLOBENZAPRINE HCL 10 MG PO TABS: ORAL_TABLET | ORAL | 0 refills | Status: DC

## 2017-08-15 MED ORDER — PREDNISONE 5 MG (21) PO TBPK
5.0000 mg | ORAL_TABLET | ORAL | 0 refills | Status: DC
Start: 2017-08-15 — End: 2017-11-22

## 2017-08-15 MED ORDER — IBUPROFEN 600 MG PO TABS: 600.0000 mg | ORAL_TABLET | Freq: Three times a day (TID) | ORAL | 0 refills | Status: DC | PRN

## 2017-08-15 MED ORDER — METHYLPREDNISOLONE ACETATE 80 MG/ML IJ SUSP
80.0000 mg | Freq: Once | INTRAMUSCULAR | Status: AC
  Administered 2017-08-15: 80 mg via INTRAMUSCULAR

## 2017-08-15 MED ORDER — KETOROLAC TROMETHAMINE 60 MG/2ML IM SOLN
60.0000 mg | Freq: Once | INTRAMUSCULAR | Status: AC
  Administered 2017-08-15: 60 mg via INTRAMUSCULAR

## 2017-08-15 NOTE — Progress Notes (Signed)
Catherine Allen     MRN: [id number]      DOB: 10/31/1975   HPI Catherine Allen is here for Neck and upper back and right shoulder pain which started in march and has occurred intermittently but is worsening , an din ht epast month also experiencing anterior chest right chest pain, at its worse pain is a 6 the lifting of her Mom ended last month Has had pain daily for the l;ast week in the anterior chest, aggravated by turning the neck, has been taking  Motrin 400 mg three times daily for  With some relief ROS Denies recent fever or chills. Denies sinus pressure, nasal congestion, ear pain or sore throat. Denies chest congestion, productive cough or wheezing. Denies chest pains, palpitations and leg swelling Denies abdominal pain, nausea, vomiting,diarrhea or constipation.   Denies dysuria, frequency, hesitancy or incontinence.  Denies headaches, seizures, numbness, or tingling. Denies depression, anxiety or insomnia. Denies skin break down or rash.   PE  BP 130/80   Pulse 81   Resp 16   Ht 5\' 3"  (1.6 m)   Wt 167 lb (75.8 kg)   SpO2 99%   BMI 29.58 kg/m   Patient alert and oriented and in no cardiopulmonary distress.  HEENT: No facial asymmetry, EOMI,   oropharynx pink and moist.  Neck decreased ROM no JVD, no mass.  Chest: Clear to auscultation bilaterally.  CVS: S1, S2 no murmurs, no S3.Regular rate.  ABD: Soft non tender.   Ext: No edema  MS: Adequate ROM spine, , hips and knees. Adequate  ROM shoulder  Skin: Intact, no ulcerations or rash noted.  Psych: Good eye contact, normal affect. Memory intact not anxious or depressed appearing.  CNS: CN 2-12 intact, power,  normal throughout.no focal deficits noted.   Assessment & Plan  Thoracic spine pain Upper back pain for over 3 months radiating to posterior right  Shoulder and upper arm need to  Obtain X ray of thoracic spine, and treat with anti inflammatories  Neck pain of over 3 months  duration Uncontrolled.Toradol and depo medrol administered IM in the office , to be followed by a short course of oral prednisone and NSAIDS. Obtain x ray c spine Muscle relaxant at bedtime for spasm  Shoulder pain, right Several month h/o pain, on exam she has full ROM, however , will obtain X ray of shoulder  Essential hypertension Controlled, no change in medication DASH diet and commitment to daily physical activity for a minimum of 30 minutes discussed and encouraged, as a part of hypertension management. The importance of attaining a healthy weight is also discussed.  BP/Weight 08/15/2017 06/06/2017 02/06/2017 11/21/2016 10/18/2016 09/06/2016 03/23/2016  Systolic BP 130 118 122 108 120 128 122  Diastolic BP 80 84 76 80 76 82 80  Wt. (Lbs) 167 163.8 164.5 165 167 166 163  BMI 29.58 29.02 29.14 29.23 29.58 28.49 27.98       Overweight Deteriorated. Patient re-educated about  the importance of commitment to a  minimum of 150 minutes of exercise per week.  The importance of healthy food choices with portion control discussed. Encouraged to start a food diary, count calories and to consider  joining a support group. Sample diet sheets offered. Goals set by the patient for the next several months.   Weight /BMI 08/15/2017 06/06/2017 02/06/2017  WEIGHT 167 lb 163 lb 12.8 oz 164 lb 8 oz  HEIGHT 5\' 3"  5\' 3"  5\' 3"   BMI 29.58 kg/m2 29.02 kg/m2  29.14 kg/m2

## 2017-08-15 NOTE — Assessment & Plan Note (Addendum)
Upper back pain for over 3 months radiating to posterior right  Shoulder and upper arm need to  Obtain X ray of thoracic spine, and treat with anti inflammatories

## 2017-08-15 NOTE — Patient Instructions (Signed)
F/u as before , call if you need me sooner  Toradol and depo medrol in the office today for pain  Prednisone, ibuprofen and flexeril and Zantac are prescribed  Labs today due to spasm chem 7, calcium an d magnesium levels   pLease get the x rays at the hospital after you have your  blood drawn  You may NOT need physical therapy so I am not referring you at this time, let me know if pain is persisiting and you want the referral

## 2017-08-16 ENCOUNTER — Encounter: Payer: Self-pay | Admitting: Family Medicine

## 2017-08-16 LAB — BASIC METABOLIC PANEL
BUN: 8 mg/dL (ref 7–25)
CHLORIDE: 104 mmol/L (ref 98–110)
CO2: 25 mmol/L (ref 20–32)
Calcium: 8.9 mg/dL (ref 8.6–10.2)
Creat: 0.91 mg/dL (ref 0.50–1.10)
Glucose, Bld: 86 mg/dL (ref 65–139)
POTASSIUM: 3.7 mmol/L (ref 3.5–5.3)
SODIUM: 138 mmol/L (ref 135–146)

## 2017-08-16 LAB — MAGNESIUM: Magnesium: 2.1 mg/dL (ref 1.5–2.5)

## 2017-08-16 NOTE — Assessment & Plan Note (Addendum)
Uncontrolled.Toradol and depo medrol administered IM in the office , to be followed by a short course of oral prednisone and NSAIDS. Obtain x ray c spine Muscle relaxant at bedtime for spasm

## 2017-08-16 NOTE — Assessment & Plan Note (Signed)
Controlled, no change in medication DASH diet and commitment to daily physical activity for a minimum of 30 minutes discussed and encouraged, as a part of hypertension management. The importance of attaining a healthy weight is also discussed.  BP/Weight 08/15/2017 06/06/2017 02/06/2017 11/21/2016 10/18/2016 09/06/2016 03/23/2016  Systolic BP 130 118 122 108 120 128 122  Diastolic BP 80 84 76 80 76 82 80  Wt. (Lbs) 167 163.8 164.5 165 167 166 163  BMI 29.58 29.02 29.14 29.23 29.58 28.49 27.98

## 2017-08-16 NOTE — Assessment & Plan Note (Signed)
Deteriorated. Patient re-educated about  the importance of commitment to a  minimum of 150 minutes of exercise per week.  The importance of healthy food choices with portion control discussed. Encouraged to start a food diary, count calories and to consider  joining a support group. Sample diet sheets offered. Goals set by the patient for the next several months.   Weight /BMI 08/15/2017 06/06/2017 02/06/2017  WEIGHT 167 lb 163 lb 12.8 oz 164 lb 8 oz  HEIGHT 5\' 3"  5\' 3"  5\' 3"   BMI 29.58 kg/m2 29.02 kg/m2 29.14 kg/m2

## 2017-08-16 NOTE — Assessment & Plan Note (Signed)
Several month h/o pain, on exam she has full ROM, however , will obtain X ray of shoulder

## 2017-09-03 ENCOUNTER — Other Ambulatory Visit: Payer: Self-pay | Admitting: Family Medicine

## 2017-09-03 DIAGNOSIS — A6004 Herpesviral vulvovaginitis: Secondary | ICD-10-CM

## 2017-09-05 ENCOUNTER — Other Ambulatory Visit: Payer: Self-pay | Admitting: Family Medicine

## 2017-10-09 ENCOUNTER — Other Ambulatory Visit: Payer: Self-pay | Admitting: Family Medicine

## 2017-10-10 ENCOUNTER — Other Ambulatory Visit: Payer: Self-pay | Admitting: Family Medicine

## 2017-10-10 MED ORDER — LORAZEPAM 1 MG PO TABS: 1.0000 mg | ORAL_TABLET | Freq: Two times a day (BID) | ORAL | 3 refills | Status: DC | PRN

## 2017-11-16 ENCOUNTER — Other Ambulatory Visit: Payer: Self-pay | Admitting: Family Medicine

## 2017-11-16 DIAGNOSIS — Z1231 Encounter for screening mammogram for malignant neoplasm of breast: Secondary | ICD-10-CM

## 2017-11-20 ENCOUNTER — Telehealth: Payer: Self-pay

## 2017-11-20 DIAGNOSIS — I10 Essential (primary) hypertension: Secondary | ICD-10-CM

## 2017-11-20 DIAGNOSIS — E785 Hyperlipidemia, unspecified: Secondary | ICD-10-CM

## 2017-11-20 LAB — COMPLETE METABOLIC PANEL WITH GFR
AG Ratio: 1.4 (calc) (ref 1.0–2.5)
ALT: 11 U/L (ref 6–29)
AST: 14 U/L (ref 10–30)
Albumin: 3.9 g/dL (ref 3.6–5.1)
Alkaline phosphatase (APISO): 56 U/L (ref 33–115)
BUN: 9 mg/dL (ref 7–25)
CO2: 27 mmol/L (ref 20–32)
CREATININE: 1.08 mg/dL (ref 0.50–1.10)
Calcium: 9.1 mg/dL (ref 8.6–10.2)
Chloride: 106 mmol/L (ref 98–110)
GFR, EST NON AFRICAN AMERICAN: 63 mL/min/{1.73_m2} (ref >=60)
GFR, Est African American: 73 mL/min/{1.73_m2} (ref >=60)
Globulin: 2.8 g/dL (calc) (ref 1.9–3.7)
Glucose, Bld: 87 mg/dL (ref 65–99)
Potassium: 3.8 mmol/L (ref 3.5–5.3)
Sodium: 140 mmol/L (ref 135–146)
Total Bilirubin: 0.4 mg/dL (ref 0.2–1.2)
Total Protein: 6.7 g/dL (ref 6.1–8.1)

## 2017-11-20 LAB — LIPID PANEL
CHOL/HDL RATIO: 4.3 (calc) (ref <5.0)
Cholesterol: 193 mg/dL (ref <200)
HDL: 45 mg/dL — AB (ref >50)
LDL Cholesterol (Calc): 128 mg/dL (calc) — ABNORMAL HIGH
NON-HDL CHOLESTEROL (CALC): 148 mg/dL — AB (ref <130)
TRIGLYCERIDES: 98 mg/dL (ref <150)

## 2017-11-20 NOTE — Telephone Encounter (Signed)
Labs ordered.

## 2017-11-22 ENCOUNTER — Encounter: Payer: Self-pay | Admitting: Family Medicine

## 2017-11-22 ENCOUNTER — Ambulatory Visit (INDEPENDENT_AMBULATORY_CARE_PROVIDER_SITE_OTHER): Payer: Medicaid Other | Admitting: Family Medicine

## 2017-11-22 VITALS — BP 130/90 | HR 83 | Resp 16 | Ht 63.0 in | Wt 172.0 lb

## 2017-11-22 DIAGNOSIS — Z23 Encounter for immunization: Secondary | ICD-10-CM | POA: Insufficient documentation

## 2017-11-22 DIAGNOSIS — M21611 Bunion of right foot: Secondary | ICD-10-CM | POA: Insufficient documentation

## 2017-11-22 DIAGNOSIS — E785 Hyperlipidemia, unspecified: Secondary | ICD-10-CM | POA: Diagnosis not present

## 2017-11-22 DIAGNOSIS — F411 Generalized anxiety disorder: Secondary | ICD-10-CM

## 2017-11-22 DIAGNOSIS — I10 Essential (primary) hypertension: Secondary | ICD-10-CM

## 2017-11-22 DIAGNOSIS — G4700 Insomnia, unspecified: Secondary | ICD-10-CM | POA: Diagnosis not present

## 2017-11-22 MED ORDER — PRAVASTATIN SODIUM 40 MG PO TABS: 40.0000 mg | ORAL_TABLET | Freq: Every day | ORAL | 1 refills | Status: DC

## 2017-11-22 NOTE — Assessment & Plan Note (Signed)
Obesity associated with hypertension and dyslipidemioa DASH diet and commitment to daily physical activity for a minimum of 30 minutes discussed and encouraged, as a part of hypertension management. The importance of attaining a healthy weight is also discussed.  BP/Weight 11/22/2017 08/15/2017 06/06/2017 02/06/2017 11/21/2016 10/18/2016 09/06/2016  Systolic BP 130 130 118 122 108 120 128  Diastolic BP 90 80 84 76 80 76 82  Wt. (Lbs) 172 167 163.8 164.5 165 167 166  BMI 30.47 29.58 29.02 29.14 29.23 29.58 28.49

## 2017-11-22 NOTE — Assessment & Plan Note (Addendum)
After obtaining informed consent, the vaccine is  administered 

## 2017-11-22 NOTE — Patient Instructions (Addendum)
F/U in 4.5 months, call if you need me before  CONGRATS on flu vaccine  Please turn off tV when sleeping  Try melatonin to help with sleep   Please work on lifestyle changes for improved health, BP is slightly  It is important that you exercise regularly at least 30 minutes 5 times a week. If you develop chest pain, have severe difficulty breathing, or feel very tired, stop exercising immediately and seek medical attention    Please eat like great grandma , vegetables , fruit, and water  Pravachol dose is increased to 40 mg daily  Fasting lipid, cmp and EGFR  1 week before visit  Insomnia Insomnia is a sleep disorder that makes it difficult to fall asleep or to stay asleep. Insomnia can cause tiredness (fatigue), low energy, difficulty concentrating, mood swings, and poor performance at work or school. There are three different ways to classify insomnia:  Difficulty falling asleep.  Difficulty staying asleep.  Waking up too early in the morning.  Any type of insomnia can be long-term (chronic) or short-term (acute). Both are common. Short-term insomnia usually lasts for three months or less. Chronic insomnia occurs at least three times a week for longer than three months. What are the causes? Insomnia may be caused by another condition, situation, or substance, such as:  Anxiety.  Certain medicines.  Gastroesophageal reflux disease (GERD) or other gastrointestinal conditions.  Asthma or other breathing conditions.  Restless legs syndrome, sleep apnea, or other sleep disorders.  Chronic pain.  Menopause. This may include hot flashes.  Stroke.  Abuse of alcohol, tobacco, or illegal drugs.  Depression.  Caffeine.  Neurological disorders, such as Alzheimer disease.  An overactive thyroid (hyperthyroidism).  The cause of insomnia may not be known. What increases the risk? Risk factors for insomnia include:  Gender. Women are more commonly affected than  men.  Age. Insomnia is more common as you get older.  Stress. This may involve your professional or personal life.  Income. Insomnia is more common in people with lower income.  Lack of exercise.  Irregular work schedule or night shifts.  Traveling between different time zones.  What are the signs or symptoms? If you have insomnia, trouble falling asleep or trouble staying asleep is the main symptom. This may lead to other symptoms, such as:  Feeling fatigued.  Feeling nervous about going to sleep.  Not feeling rested in the morning.  Having trouble concentrating.  Feeling irritable, anxious, or depressed.  How is this treated? Treatment for insomnia depends on the cause. If your insomnia is caused by an underlying condition, treatment will focus on addressing the condition. Treatment may also include:  Medicines to help you sleep.  Counseling or therapy.  Lifestyle adjustments.  Follow these instructions at home:  Take medicines only as directed by your health care provider.  Keep regular sleeping and waking hours. Avoid naps.  Keep a sleep diary to help you and your health care provider figure out what could be causing your insomnia. Include: ? When you sleep. ? When you wake up during the night. ? How well you sleep. ? How rested you feel the next day. ? Any side effects of medicines you are taking. ? What you eat and drink.  Make your bedroom a comfortable place where it is easy to fall asleep: ? Put up shades or special blackout curtains to block light from outside. ? Use a white noise machine to block noise. ? Keep the temperature cool.  Exercise regularly as directed by your health care provider. Avoid exercising right before bedtime.  Use relaxation techniques to manage stress. Ask your health care provider to suggest some techniques that may work well for you. These may include: ? Breathing exercises. ? Routines to release muscle  tension. ? Visualizing peaceful scenes.  Cut back on alcohol, caffeinated beverages, and cigarettes, especially close to bedtime. These can disrupt your sleep.  Do not overeat or eat spicy foods right before bedtime. This can lead to digestive discomfort that can make it hard for you to sleep.  Limit screen use before bedtime. This includes: ? Watching TV. ? Using your smartphone, tablet, and computer.  Stick to a routine. This can help you fall asleep faster. Try to do a quiet activity, brush your teeth, and go to bed at the same time each night.  Get out of bed if you are still awake after 15 minutes of trying to sleep. Keep the lights down, but try reading or doing a quiet activity. When you feel sleepy, go back to bed.  Make sure that you drive carefully. Avoid driving if you feel very sleepy.  Keep all follow-up appointments as directed by your health care provider. This is important. Contact a health care provider if:  You are tired throughout the day or have trouble in your daily routine due to sleepiness.  You continue to have sleep problems or your sleep problems get worse. Get help right away if:  You have serious thoughts about hurting yourself or someone else. This information is not intended to replace advice given to you by your health care provider. Make sure you discuss any questions you have with your health care provider. Document Released: 12/24/1999 Document Revised: 05/28/2015 Document Reviewed: 09/26/2013 Elsevier Interactive Patient Education  [employer].

## 2017-11-22 NOTE — Assessment & Plan Note (Signed)
Mildly symptomatic , pt will focus on footwear and continue to wear pad over bunion for comfort at this time

## 2017-11-22 NOTE — Progress Notes (Signed)
Catherine Allen     MRN: [id number]      DOB: Karyl 03, 1977   HPI Catherine Allen is here for follow up and re-evaluation of chronic medical conditions, medication management and review of any available recent lab and radiology data.  Preventive health is updated, specifically  Cancer screening and Immunization.   Questions or concerns regarding consultations or procedures which the PT has had in the interim are  addressed. The PT denies any adverse reactions to current medications since the last visit.  There are no new concerns.  There are no specific complaints   ROS Denies recent fever or chills. Denies sinus pressure, nasal congestion, ear pain or sore throat. Denies chest congestion, productive cough or wheezing. Denies chest pains, palpitations and leg swelling Denies abdominal pain, nausea, vomiting,diarrhea or constipation.   Denies dysuria, frequency, hesitancy or incontinence. Denies joint pain, swelling and limitation in mobility. Denies headaches, seizures, numbness, or tingling. Denies depression, anxiety or insomnia. Denies skin break down or rash.   PE  BP 130/90   Pulse 83   Resp 16   Ht 5\' 3"  (1.6 m)   Wt 172 lb (78 kg)   SpO2 99%   BMI 30.47 kg/m   Patient alert and oriented and in no cardiopulmonary distress.  HEENT: No facial asymmetry, EOMI,   oropharynx pink and moist.  Neck supple no JVD, no mass.  Chest: Clear to auscultation bilaterally.  CVS: S1, S2 no murmurs, no S3.Regular rate.  ABD: Soft non tender.   Ext: No edema  MS: Adequate ROM spine, shoulders, hips and knees.  Skin: Intact, no ulcerations or rash noted.  Psych: Good eye contact, normal affect. Memory intact not anxious or depressed appearing.  CNS: CN 2-12 intact, power,  normal throughout.no focal deficits noted.   Assessment & Plan  Essential hypertension Not at goal, no med change DASH diet and commitment to daily physical activity for a minimum of 30 minutes discussed  and encouraged, as a part of hypertension management. The importance of attaining a healthy weight is also discussed.  BP/Weight 11/22/2017 08/15/2017 06/06/2017 02/06/2017 11/21/2016 10/18/2016 09/06/2016  Systolic BP 130 130 118 122 108 120 128  Diastolic BP 90 80 84 76 80 76 82  Wt. (Lbs) 172 167 163.8 164.5 165 167 166  BMI 30.47 29.58 29.02 29.14 29.23 29.58 28.49       Morbid obesity (HCC) Obesity associated with hypertension and dyslipidemioa DASH diet and commitment to daily physical activity for a minimum of 30 minutes discussed and encouraged, as a part of hypertension management. The importance of attaining a healthy weight is also discussed.  BP/Weight 11/22/2017 08/15/2017 06/06/2017 02/06/2017 11/21/2016 10/18/2016 09/06/2016  Systolic BP 130 130 118 122 108 120 128  Diastolic BP 90 80 84 76 80 76 82  Wt. (Lbs) 172 167 163.8 164.5 165 167 166  BMI 30.47 29.58 29.02 29.14 29.23 29.58 28.49       Hyperlipemia Hyperlipidemia:Low fat diet discussed and encouraged.   Lipid Panel  Lab Results  Component Value Date   CHOL 193 11/20/2017   HDL 45 (L) 11/20/2017   LDLCALC 128 (H) 11/20/2017   TRIG 98 11/20/2017   CHOLHDL 4.3 11/20/2017   Uncontrolled, increase dose of pravastatin and change diet Updated lab needed at/ before next visit.;l     GAD (generalized anxiety disorder) Unchanged,  Ad controlled on medications currently  taking  Insomnia 3 month h/o symptoms, sleeps between 4 to 5 hrs, has tV on.  Sleep hygiene discussed will try oTC melatonin  Bunion of great toe of right foot Mildly symptomatic , pt will focus on footwear and continue to wear pad over bunion for comfort at this time  Need for immunization against influenza After obtaining informed consent, the vaccine is  administered

## 2017-11-22 NOTE — Assessment & Plan Note (Signed)
Not at goal, no med change DASH diet and commitment to daily physical activity for a minimum of 30 minutes discussed and encouraged, as a part of hypertension management. The importance of attaining a healthy weight is also discussed.  BP/Weight 11/22/2017 08/15/2017 06/06/2017 02/06/2017 11/21/2016 10/18/2016 09/06/2016  Systolic BP 130 130 118 122 108 120 128  Diastolic BP 90 80 84 76 80 76 82  Wt. (Lbs) 172 167 163.8 164.5 165 167 166  BMI 30.47 29.58 29.02 29.14 29.23 29.58 28.49

## 2017-11-22 NOTE — Assessment & Plan Note (Signed)
Unchanged,  Ad controlled on medications currently  taking

## 2017-11-22 NOTE — Assessment & Plan Note (Signed)
Hyperlipidemia:Low fat diet discussed and encouraged.   Lipid Panel  Lab Results  Component Value Date   CHOL 193 11/20/2017   HDL 45 (L) 11/20/2017   LDLCALC 128 (H) 11/20/2017   TRIG 98 11/20/2017   CHOLHDL 4.3 11/20/2017   Uncontrolled, increase dose of pravastatin and change diet Updated lab needed at/ before next visit.;l

## 2017-11-22 NOTE — Assessment & Plan Note (Signed)
3 month h/o symptoms, sleeps between 4 to 5 hrs, has tV on. Sleep hygiene discussed will try oTC melatonin

## 2017-11-29 ENCOUNTER — Other Ambulatory Visit: Payer: Self-pay | Admitting: Family Medicine

## 2017-12-11 ENCOUNTER — Encounter: Payer: Self-pay | Admitting: Family Medicine

## 2017-12-12 ENCOUNTER — Ambulatory Visit (INDEPENDENT_AMBULATORY_CARE_PROVIDER_SITE_OTHER): Payer: Medicaid Other | Admitting: Family Medicine

## 2017-12-12 ENCOUNTER — Encounter: Payer: Self-pay | Admitting: Family Medicine

## 2017-12-12 VITALS — BP 131/91 | HR 81 | Resp 12 | Ht 63.0 in | Wt 169.0 lb

## 2017-12-12 DIAGNOSIS — J3089 Other allergic rhinitis: Secondary | ICD-10-CM

## 2017-12-12 DIAGNOSIS — H6121 Impacted cerumen, right ear: Secondary | ICD-10-CM

## 2017-12-12 DIAGNOSIS — I10 Essential (primary) hypertension: Secondary | ICD-10-CM

## 2017-12-12 DIAGNOSIS — E663 Overweight: Secondary | ICD-10-CM | POA: Diagnosis not present

## 2017-12-12 MED ORDER — MONTELUKAST SODIUM 10 MG PO TABS: 10.0000 mg | ORAL_TABLET | Freq: Every day | ORAL | 3 refills | Status: DC

## 2017-12-12 MED ORDER — BENZONATATE 100 MG PO CAPS: 100.0000 mg | ORAL_CAPSULE | Freq: Two times a day (BID) | ORAL | 1 refills | Status: DC | PRN

## 2017-12-12 MED ORDER — CHLORPHENIRAMINE MALEATE 4 MG PO TABS: ORAL_TABLET | ORAL | 1 refills | Status: DC

## 2017-12-12 MED ORDER — PREDNISONE 5 MG PO TABS: ORAL_TABLET | ORAL | 0 refills | Status: DC

## 2017-12-12 NOTE — Patient Instructions (Signed)
F/u as before, call if you need me sooner  You are treated for uncontrolled allergy symptoms with Singulair, prednisone and chlorpheniramine  Right ear flush today , if not better call for ENT referral  Please work on good  health habits so that your health will improve. 1. Commitment to daily physical activity for 30 to 60  minutes, if you are able to do this.  2. Commitment to wise food choices. Aim for half of your  food intake to be vegetable and fruit, one quarter starchy foods, and one quarter protein. Try to eat on a regular schedule  3 meals per day, snacking between meals should be limited to vegetables or fruits or small portions of nuts. 64 ounces of water per day is generally recommended, unless you have specific health conditions, like heart failure or kidney failure where you will need to limit fluid intake.  3. Commitment to sufficient and a  good quality of physical and mental rest daily, generally between 6 to 8 hours per day.  WITH PERSISTANCE AND PERSEVERANCE, THE IMPOSSIBLE , BECOMES THE NORM! It is important that you exercise regularly at least 30 minutes 5 times a week. If you develop chest pain, have severe difficulty breathing, or feel very tired, stop exercising immediately and seek medical attention

## 2017-12-12 NOTE — Assessment & Plan Note (Signed)
Needs to improve, no med change DASH diet and commitment to daily physical activity for a minimum of 30 minutes discussed and encouraged, as a part of hypertension management. The importance of attaining a healthy weight is also discussed.  BP/Weight 12/12/2017 11/22/2017 08/15/2017 06/06/2017 02/06/2017 11/21/2016 10/18/2016  Systolic BP 131 130 130 118 122 108 120  Diastolic BP 91 90 80 84 76 80 76  Wt. (Lbs) 169 172 167 163.8 164.5 165 167  BMI 29.94 30.47 29.58 29.02 29.14 29.23 29.58

## 2017-12-12 NOTE — Progress Notes (Signed)
   Catherine Allen     MRN: [id number]      DOB: 06/29/1975   HPI Catherine Allen is here 2 weeks ago, had head and chest congestion which cleared seemingly, however in past 3 days right ear  pressure , right maxillary pressure and increased post nasal drainage with cough, she denies fever or chills. No sputum from chest and nasal drainage when it occurs is  clear  ROS See HPI  Denies chest pains, palpitations and leg swelling Denies abdominal pain, nausea, vomiting,diarrhea or constipation.   Denies dysuria, frequency, hesitancy or incontinence. Denies joint pain, swelling and limitation in mobility. Denies headaches, seizures, numbness, or tingling. Denies depression, anxiety or insomnia. Denies skin break down or rash.   PE  BP (!) 131/91 (BP Location: Left Arm, Patient Position: Sitting, Cuff Size: Normal)   Pulse 81   Resp 12   Ht 5\' 3"  (1.6 m)   Wt 169 lb (76.7 kg)   SpO2 98% Comment: room air  BMI 29.94 kg/m   Patient alert and oriented and in no cardiopulmonary distress.  HEENT: No facial asymmetry, EOMI,   oropharynx pink and moist.  Neck supple no JVD, no mass.Right TM occluded by cerumen, no sinus tenderness, erythema and edema of nasal mucosa  Chest: Clear to auscultation bilaterally.  CVS: S1, S2 no murmurs, no S3.Regular rate.  ABD: Soft non tender.   Ext: No edema  MS: Adequate ROM spine, shoulders, hips and knees.  Skin: Intact, no ulcerations or rash noted.  Psych: Good eye contact, normal affect. Memory intact not anxious or depressed appearing.  CNS: CN 2-12 intact, power,  normal throughout.no focal deficits noted.   Assessment & Plan  Allergic rhinitis Unocntrolled, start singulair, prednisone short term and chllorpheniarmine Saline nasal flushes and tessalon perle  Cerumen debris on tympanic membrane of right ear Flush by LPN if remains symptomatic then ENT referral  Essential hypertension Needs to improve, no med change DASH diet and  commitment to daily physical activity for a minimum of 30 minutes discussed and encouraged, as a part of hypertension management. The importance of attaining a healthy weight is also discussed.  BP/Weight 12/12/2017 11/22/2017 08/15/2017 06/06/2017 02/06/2017 11/21/2016 10/18/2016  Systolic BP 131 130 130 118 122 108 120  Diastolic BP 91 90 80 84 76 80 76  Wt. (Lbs) 169 172 167 163.8 164.5 165 167  BMI 29.94 30.47 29.58 29.02 29.14 29.23 29.58       Overweight (BMI 25.0-29.9) Improved Patient re-educated about  the importance of commitment to a  minimum of 150 minutes of exercise per week.  The importance of healthy food choices with portion control discussed. Encouraged to start a food diary, count calories and to consider  joining a support group. Sample diet sheets offered. Goals set by the patient for the next several months.   Weight /BMI 12/12/2017 11/22/2017 08/15/2017  WEIGHT 169 lb 172 lb 167 lb  HEIGHT 5\' 3"  5\' 3"  5\' 3"   BMI 29.94 kg/m2 30.47 kg/m2 29.58 kg/m2

## 2017-12-12 NOTE — Assessment & Plan Note (Addendum)
Flush by LPN if remains symptomatic then ENT referral

## 2017-12-12 NOTE — Assessment & Plan Note (Addendum)
Unocntrolled, start singulair, prednisone short term and chllorpheniarmine Saline nasal flushes and tessalon perle

## 2017-12-13 ENCOUNTER — Other Ambulatory Visit: Payer: Self-pay | Admitting: Family Medicine

## 2017-12-14 ENCOUNTER — Inpatient Hospital Stay (HOSPITAL_COMMUNITY): Admission: RE | Admit: 2017-12-14 | Payer: Self-pay | Source: Ambulatory Visit

## 2017-12-16 ENCOUNTER — Encounter: Payer: Self-pay | Admitting: Family Medicine

## 2017-12-16 NOTE — Assessment & Plan Note (Signed)
Improved Patient re-educated about  the importance of commitment to a  minimum of 150 minutes of exercise per week.  The importance of healthy food choices with portion control discussed. Encouraged to start a food diary, count calories and to consider  joining a support group. Sample diet sheets offered. Goals set by the patient for the next several months.   Weight /BMI 12/12/2017 11/22/2017 08/15/2017  WEIGHT 169 lb 172 lb 167 lb  HEIGHT 5\' 3"  5\' 3"  5\' 3"   BMI 29.94 kg/m2 30.47 kg/m2 29.58 kg/m2

## 2017-12-17 ENCOUNTER — Ambulatory Visit (HOSPITAL_COMMUNITY)
Admission: RE | Admit: 2017-12-17 | Discharge: 2017-12-17 | Disposition: A | Discharge status: Home / Self Care | Payer: Medicaid Other | Source: Ambulatory Visit | Attending: Family Medicine | Admitting: Family Medicine

## 2017-12-17 DIAGNOSIS — Z1231 Encounter for screening mammogram for malignant neoplasm of breast: Secondary | ICD-10-CM | POA: Diagnosis not present

## 2018-01-31 ENCOUNTER — Other Ambulatory Visit: Payer: Self-pay | Admitting: Family Medicine

## 2018-03-02 ENCOUNTER — Other Ambulatory Visit: Payer: Self-pay | Admitting: Family Medicine

## 2018-03-30 ENCOUNTER — Other Ambulatory Visit: Payer: Self-pay | Admitting: Family Medicine

## 2018-04-08 ENCOUNTER — Ambulatory Visit (INDEPENDENT_AMBULATORY_CARE_PROVIDER_SITE_OTHER): Payer: Medicaid Other | Admitting: Family Medicine

## 2018-04-08 ENCOUNTER — Encounter: Payer: Self-pay | Admitting: Family Medicine

## 2018-04-08 DIAGNOSIS — J3089 Other allergic rhinitis: Secondary | ICD-10-CM

## 2018-04-08 DIAGNOSIS — I10 Essential (primary) hypertension: Secondary | ICD-10-CM | POA: Diagnosis not present

## 2018-04-08 DIAGNOSIS — A6004 Herpesviral vulvovaginitis: Secondary | ICD-10-CM | POA: Diagnosis not present

## 2018-04-08 DIAGNOSIS — F411 Generalized anxiety disorder: Secondary | ICD-10-CM | POA: Diagnosis not present

## 2018-04-08 DIAGNOSIS — E785 Hyperlipidemia, unspecified: Secondary | ICD-10-CM

## 2018-04-08 DIAGNOSIS — F410 Panic disorder [episodic paroxysmal anxiety] without agoraphobia: Secondary | ICD-10-CM | POA: Diagnosis not present

## 2018-04-08 NOTE — Assessment & Plan Note (Signed)
Hyperlipidemia:Low fat diet discussed and encouraged.   Lipid Panel  Lab Results  Component Value Date   CHOL 193 11/20/2017   HDL 45 (L) 11/20/2017   LDLCALC 128 (H) 11/20/2017   TRIG 98 11/20/2017   CHOLHDL 4.3 11/20/2017  Updated lab needed at/ before next visit.

## 2018-04-08 NOTE — Assessment & Plan Note (Signed)
Controlled, no change in medication  

## 2018-04-08 NOTE — Addendum Note (Signed)
Addended by: Abner Greenspan on: 04/08/2018 09:09 AM   Modules accepted: Orders

## 2018-04-08 NOTE — Patient Instructions (Addendum)
F/U in end July, call if you need me before  Thankful that you are keeping welloverall, remember we are here for you!  Fasting CBC, lipid , cmp and eGR tSH 1 week before next visit    Social distancing. Frequent hand washing with soap and water Keeping your hands off of your face. These 3 practices will help to keep both you and your community healthy during this time. Please practice them faithfully!   Thank you  for choosing [facility]. We consider it a privelige to serve you.  Delivering excellent health care in a caring and  compassionate way is our goal.  Partnering with you,  so that together we can achieve this goal is our strategy.

## 2018-04-08 NOTE — Assessment & Plan Note (Signed)
DASH diet and commitment to daily physical activity for a minimum of 30 minutes discussed and encouraged, as a part of hypertension management. The importance of attaining a healthy weight is also discussed.  BP/Weight 12/12/2017 11/22/2017 08/15/2017 06/06/2017 02/06/2017 11/21/2016 10/18/2016  Systolic BP 131 130 130 118 122 108 120  Diastolic BP 91 90 80 84 76 80 76  Wt. (Lbs) 169 172 167 163.8 164.5 165 167  BMI 29.94 30.47 29.58 29.02 29.14 29.23 29.58     Will need rept test in 4 months, diastolic consistently elevated

## 2018-04-08 NOTE — Assessment & Plan Note (Signed)
Denies any panic attack in last 4 weeks, relies on lorazepam when occurs, also using behavioral  technique

## 2018-04-08 NOTE — Assessment & Plan Note (Signed)
Denies any recent flare

## 2018-04-08 NOTE — Progress Notes (Signed)
Virtual Visit via Telephone Note  I connected with Krystyl D Suhre on 04/08/18 at  8:20 AM EDT by telephone and verified that I am speaking with the correct person using two identifiers.   I discussed the limitations, risks, security and privacy concerns of performing an evaluation and management service by telephone and the availability of in person appointments. I also discussed with the patient that there may be a patient responsible charge related to this service. The patient expressed understanding and agreed to proceed.I spoke with py in het home and I am at the office   History of Present Illness: Denies recent fever or chills. Denies sinus pressure, nasal congestion, ear pain or sore throat. Denies chest congestion, productive cough or wheezing. Denies chest pains, palpitations and leg swelling Denies abdominal pain, nausea, vomiting,diarrhea or constipation.   Denies dysuria, frequency, hesitancy or incontinence. Denies joint pain, swelling and limitation in mobility. Denies headaches, seizures, numbness, or tingling. Denies depression, anxiety or insomnia. Denies skin break down or rash.        Observations/Objective:   Assessment and Plan: Essential hypertension DASH diet and commitment to daily physical activity for a minimum of 30 minutes discussed and encouraged, as a part of hypertension management. The importance of attaining a healthy weight is also discussed.  BP/Weight 12/12/2017 11/22/2017 08/15/2017 06/06/2017 02/06/2017 11/21/2016 10/18/2016  Systolic BP 131 130 130 118 122 108 120  Diastolic BP 91 90 80 84 76 80 76  Wt. (Lbs) 169 172 167 163.8 164.5 165 167  BMI 29.94 30.47 29.58 29.02 29.14 29.23 29.58     Will need rept test in 4 months, diastolic consistently elevated  GAD (generalized anxiety disorder) Controlled, no change in medication   Hyperlipemia Hyperlipidemia:Low fat diet discussed and encouraged.   Lipid Panel  Lab Results  Component  Value Date   CHOL 193 11/20/2017   HDL 45 (L) 11/20/2017   LDLCALC 128 (H) 11/20/2017   TRIG 98 11/20/2017   CHOLHDL 4.3 11/20/2017  Updated lab needed at/ before next visit.      Allergic rhinitis Controlled, no change in medication   Type 2 HSV infection of vulvovaginal region Denies any recent flare  Panic attacks Denies any panic attack in last 4 weeks, relies on lorazepam when occurs, also using behavioral  technique    Follow Up Instructions:    I discussed the assessment and treatment plan with the patient. The patient was provided an opportunity to ask questions and all were answered. The patient agreed with the plan and demonstrated an understanding of the instructions.   The patient was advised to call back or seek an in-person evaluation if the symptoms worsen or if the condition fails to improve as anticipated.  I provided 22 minutes of non-face-to-face time during this encounter.   Syliva Overman, MD

## 2018-05-30 ENCOUNTER — Other Ambulatory Visit: Payer: Self-pay | Admitting: Family Medicine

## 2018-06-15 ENCOUNTER — Other Ambulatory Visit: Payer: Self-pay | Admitting: Family Medicine

## 2018-06-25 ENCOUNTER — Other Ambulatory Visit: Payer: Self-pay | Admitting: Family Medicine

## 2018-07-04 ENCOUNTER — Other Ambulatory Visit: Payer: Self-pay | Admitting: Family Medicine

## 2018-07-30 DIAGNOSIS — E785 Hyperlipidemia, unspecified: Secondary | ICD-10-CM | POA: Diagnosis not present

## 2018-07-30 DIAGNOSIS — I10 Essential (primary) hypertension: Secondary | ICD-10-CM | POA: Diagnosis not present

## 2018-07-31 ENCOUNTER — Encounter: Payer: Self-pay | Admitting: Family Medicine

## 2018-07-31 LAB — LIPID PANEL
Cholesterol: 178 mg/dL (ref <200)
HDL: 44 mg/dL — ABNORMAL LOW (ref >=50)
LDL Cholesterol (Calc): 117 mg/dL (calc) — ABNORMAL HIGH
Non-HDL Cholesterol (Calc): 134 mg/dL (calc) — ABNORMAL HIGH (ref <130)
TRIGLYCERIDES: 77 mg/dL (ref <150)
Total CHOL/HDL Ratio: 4 (calc) (ref <5.0)

## 2018-07-31 LAB — COMPLETE METABOLIC PANEL WITH GFR
AG RATIO: 1.5 (calc) (ref 1.0–2.5)
ALT: 16 U/L (ref 6–29)
AST: 17 U/L (ref 10–30)
Albumin: 3.8 g/dL (ref 3.6–5.1)
Alkaline phosphatase (APISO): 54 U/L (ref 31–125)
BILIRUBIN TOTAL: 0.5 mg/dL (ref 0.2–1.2)
BUN: 8 mg/dL (ref 7–25)
CO2: 24 mmol/L (ref 20–32)
Calcium: 8.8 mg/dL (ref 8.6–10.2)
Chloride: 105 mmol/L (ref 98–110)
Creat: 0.96 mg/dL (ref 0.50–1.10)
GFR, Est African American: 84 mL/min/{1.73_m2} (ref >=60)
GFR, Est Non African American: 72 mL/min/{1.73_m2} (ref >=60)
Globulin: 2.6 g/dL (calc) (ref 1.9–3.7)
Glucose, Bld: 88 mg/dL (ref 65–99)
Potassium: 3.8 mmol/L (ref 3.5–5.3)
Sodium: 138 mmol/L (ref 135–146)
Total Protein: 6.4 g/dL (ref 6.1–8.1)

## 2018-07-31 LAB — CBC
HCT: 40.2 % (ref 35.0–45.0)
Hemoglobin: 13.4 g/dL (ref 11.7–15.5)
MCH: 27.8 pg (ref 27.0–33.0)
MCHC: 33.3 g/dL (ref 32.0–36.0)
MCV: 83.4 fL (ref 80.0–100.0)
MPV: 9.7 fL (ref 7.5–12.5)
Platelets: 367 10*3/uL (ref 140–400)
RBC: 4.82 10*6/uL (ref 3.80–5.10)
RDW: 11.8 % (ref 11.0–15.0)
WBC: 10.2 10*3/uL (ref 3.8–10.8)

## 2018-07-31 LAB — TSH: TSH: 2 mIU/L

## 2018-08-01 ENCOUNTER — Other Ambulatory Visit: Payer: Self-pay | Admitting: Family Medicine

## 2018-08-01 DIAGNOSIS — H25093 Other age-related incipient cataract, bilateral: Secondary | ICD-10-CM | POA: Diagnosis not present

## 2018-08-01 DIAGNOSIS — I10 Essential (primary) hypertension: Secondary | ICD-10-CM | POA: Diagnosis not present

## 2018-08-01 MED ORDER — LORAZEPAM 1 MG PO TABS: 1.0000 mg | ORAL_TABLET | Freq: Two times a day (BID) | ORAL | 2 refills | Status: DC | PRN

## 2018-08-05 ENCOUNTER — Ambulatory Visit (INDEPENDENT_AMBULATORY_CARE_PROVIDER_SITE_OTHER): Payer: Medicaid Other | Admitting: Family Medicine

## 2018-08-05 ENCOUNTER — Other Ambulatory Visit: Payer: Self-pay

## 2018-08-05 ENCOUNTER — Encounter: Payer: Self-pay | Admitting: Family Medicine

## 2018-08-05 VITALS — BP 110/68 | HR 101 | Resp 12 | Ht 63.0 in | Wt 177.0 lb

## 2018-08-05 DIAGNOSIS — E663 Overweight: Secondary | ICD-10-CM

## 2018-08-05 DIAGNOSIS — I10 Essential (primary) hypertension: Secondary | ICD-10-CM

## 2018-08-05 DIAGNOSIS — Z1231 Encounter for screening mammogram for malignant neoplasm of breast: Secondary | ICD-10-CM | POA: Diagnosis not present

## 2018-08-05 DIAGNOSIS — F411 Generalized anxiety disorder: Secondary | ICD-10-CM | POA: Diagnosis not present

## 2018-08-05 DIAGNOSIS — E785 Hyperlipidemia, unspecified: Secondary | ICD-10-CM | POA: Diagnosis not present

## 2018-08-05 NOTE — Assessment & Plan Note (Signed)
  Patient re-educated about  the importance of commitment to a  minimum of 150 minutes of exercise per week as able.  The importance of healthy food choices with portion control discussed, as well as eating regularly and within a 12 hour window most days. The need to choose "clean , green" food 50 to 75% of the time is discussed, as well as to make water the primary drink and set a goal of 64 ounces water daily.    Weight /BMI 08/05/2018 12/12/2017 11/22/2017  WEIGHT 177 lb 0.6 oz 169 lb 172 lb  HEIGHT 5\' 3"  5\' 3"  5\' 3"   BMI 31.36 kg/m2 29.94 kg/m2 30.47 kg/m2

## 2018-08-05 NOTE — Patient Instructions (Addendum)
Follow-up with MD in January call if you need me before.  Please schedule mammogram due in December at checkout.  Call in September for your flu vaccine and come in to have this.   Please continue to work on lowering fat intake, as well as committing to daily exercise for 30 minutes  Fasting lipid, cmp and EGFFR, vit D, first week in  January.  Think about what you will eat, plan ahead. Choose " clean, green, fresh or frozen" over canned, processed or packaged foods which are more sugary, salty and fatty. 70 to 75% of food eaten should be vegetables and fruit. Three meals at set times with snacks allowed between meals, but they must be fruit or vegetables. Aim to eat over a 12 hour period , example 7 am to 7 pm, and STOP after  your last meal of the day. Drink water,generally about 64 ounces per day, no other drink is as healthy. Fruit juice is best enjoyed in a healthy way, by EATING the fruit.   Thanks for choosing [facility], we consider it a privelige to serve you.

## 2018-08-05 NOTE — Assessment & Plan Note (Signed)
Improved and controlled off of medication, uses xanax as needed only

## 2018-08-05 NOTE — Assessment & Plan Note (Signed)
Controlled, no change in medication DASH diet and commitment to daily physical activity for a minimum of 30 minutes discussed and encouraged, as a part of hypertension management. The importance of attaining a healthy weight is also discussed.  BP/Weight 08/05/2018 12/12/2017 11/22/2017 08/15/2017 06/06/2017 02/06/2017 69/50/7225  Systolic BP 750 518 335 825 189 842 103  Diastolic BP 68 91 90 80 84 76 80  Wt. (Lbs) 177.04 169 172 167 163.8 164.5 165  BMI 31.36 29.94 30.47 29.58 29.02 29.14 29.23

## 2018-08-05 NOTE — Progress Notes (Signed)
Catherine Allen     MRN: [id number]      DOB: 06/12/1975   HPI Catherine Allen is here for follow up and re-evaluation of chronic medical conditions, medication management and review of any available recent lab and radiology data.  Preventive health is updated, specifically  Cancer screening and Immunization.   Questions or concerns regarding consultations or procedures which the PT has had in the interim are  addressed. The PT denies any adverse reactions to current medications since the last visit.  There are no new concerns.  There are no specific complaints   ROS Denies recent fever or chills. Denies sinus pressure, nasal congestion, ear pain or sore throat. Denies chest congestion, productive cough or wheezing. Denies chest pains, palpitations and leg swelling Denies abdominal pain, nausea, vomiting,diarrhea or constipation.   Denies dysuria, frequency, hesitancy or incontinence. Denies joint pain, swelling and limitation in mobility. Denies headaches, seizures, numbness, or tingling. Denies depression, anxiety or insomnia. Denies skin break down or rash.   PE  BP 110/68   Pulse (!) 101   Resp 12   Ht 5\' 3"  (1.6 m)   Wt 177 lb 0.6 oz (80.3 kg)   SpO2 98%   BMI 31.36 kg/m   Patient alert and oriented and in no cardiopulmonary distress.  HEENT: No facial asymmetry, EOMI,   oropharynx pink and moist.  Neck supple no JVD, no mass.  Chest: Clear to auscultation bilaterally.  CVS: S1, S2 no murmurs, no S3.Regular rate.  ABD: Soft non tender.   Ext: No edema  MS: Adequate ROM spine, shoulders, hips and knees.  Skin: Intact, no ulcerations or rash noted.  Psych: Good eye contact, normal affect. Memory intact not anxious or depressed appearing.  CNS: CN 2-12 intact, power,  normal throughout.no focal deficits noted.   Assessment & Plan  Essential hypertension Controlled, no change in medication DASH diet and commitment to daily physical activity for a minimum  of 30 minutes discussed and encouraged, as a part of hypertension management. The importance of attaining a healthy weight is also discussed.  BP/Weight 08/05/2018 12/12/2017 11/22/2017 08/15/2017 06/06/2017 02/06/2017 11/21/2016  Systolic BP 110 131 130 130 118 122 108  Diastolic BP 68 91 90 80 84 76 80  Wt. (Lbs) 177.04 169 172 167 163.8 164.5 165  BMI 31.36 29.94 30.47 29.58 29.02 29.14 29.23       Hyperlipemia Hyperlipidemia:Low fat diet discussed and encouraged.   Lipid Panel  Lab Results  Component Value Date   CHOL 178 07/30/2018   HDL 44 (L) 07/30/2018   LDLCALC 117 (H) 07/30/2018   TRIG 77 07/30/2018   CHOLHDL 4.0 07/30/2018   Needs to reduce fat intake and will obtain updated lab at next visit    Overweight (BMI 25.0-29.9)  Patient re-educated about  the importance of commitment to a  minimum of 150 minutes of exercise per week as able.  The importance of healthy food choices with portion control discussed, as well as eating regularly and within a 12 hour window most days. The need to choose "clean , green" food 50 to 75% of the time is discussed, as well as to make water the primary drink and set a goal of 64 ounces water daily.    Weight /BMI 08/05/2018 12/12/2017 11/22/2017  WEIGHT 177 lb 0.6 oz 169 lb 172 lb  HEIGHT 5\' 3"  5\' 3"  5\' 3"   BMI 31.36 kg/m2 29.94 kg/m2 30.47 kg/m2      GAD (generalized anxiety  disorder) Improved and controlled off of medication, uses xanax as needed only

## 2018-08-05 NOTE — Assessment & Plan Note (Signed)
Hyperlipidemia:Low fat diet discussed and encouraged.   Lipid Panel  Lab Results  Component Value Date   CHOL 178 07/30/2018   HDL 44 (L) 07/30/2018   LDLCALC 117 (H) 07/30/2018   TRIG 77 07/30/2018   CHOLHDL 4.0 07/30/2018   Needs to reduce fat intake and will obtain updated lab at next visit

## 2018-09-01 ENCOUNTER — Other Ambulatory Visit: Payer: Self-pay | Admitting: Family Medicine

## 2018-09-02 ENCOUNTER — Encounter: Payer: Self-pay | Admitting: Family Medicine

## 2018-09-02 ENCOUNTER — Other Ambulatory Visit: Payer: Self-pay | Admitting: Family Medicine

## 2018-09-02 MED ORDER — PANTOPRAZOLE SODIUM 40 MG PO TBEC: 40.0000 mg | DELAYED_RELEASE_TABLET | Freq: Every day | ORAL | 3 refills | Status: DC

## 2018-09-25 ENCOUNTER — Other Ambulatory Visit: Payer: Self-pay | Admitting: Family Medicine

## 2018-10-11 ENCOUNTER — Ambulatory Visit (INDEPENDENT_AMBULATORY_CARE_PROVIDER_SITE_OTHER): Payer: Medicaid Other

## 2018-10-11 ENCOUNTER — Other Ambulatory Visit: Payer: Self-pay

## 2018-10-11 DIAGNOSIS — Z23 Encounter for immunization: Secondary | ICD-10-CM

## 2018-10-28 ENCOUNTER — Other Ambulatory Visit: Payer: Self-pay

## 2018-10-28 MED ORDER — BUSPIRONE HCL 5 MG PO TABS: ORAL_TABLET | ORAL | 0 refills | Status: DC

## 2018-11-26 ENCOUNTER — Encounter: Payer: Self-pay | Admitting: Family Medicine

## 2018-11-27 ENCOUNTER — Other Ambulatory Visit: Payer: Self-pay | Admitting: Family Medicine

## 2018-11-27 DIAGNOSIS — K219 Gastro-esophageal reflux disease without esophagitis: Secondary | ICD-10-CM

## 2018-11-27 NOTE — Progress Notes (Signed)
amb gastro  

## 2018-11-28 ENCOUNTER — Other Ambulatory Visit: Payer: Self-pay | Admitting: Family Medicine

## 2018-12-02 ENCOUNTER — Encounter: Payer: Self-pay | Admitting: Internal Medicine

## 2018-12-13 ENCOUNTER — Other Ambulatory Visit: Payer: Self-pay

## 2018-12-13 ENCOUNTER — Encounter: Payer: Self-pay | Admitting: Internal Medicine

## 2018-12-13 ENCOUNTER — Ambulatory Visit (INDEPENDENT_AMBULATORY_CARE_PROVIDER_SITE_OTHER): Payer: Medicaid Other | Admitting: Internal Medicine

## 2018-12-13 VITALS — BP 131/80 | HR 89 | Temp 96.2°F | Ht 62.0 in | Wt 179.0 lb

## 2018-12-13 DIAGNOSIS — K219 Gastro-esophageal reflux disease without esophagitis: Secondary | ICD-10-CM | POA: Diagnosis not present

## 2018-12-13 DIAGNOSIS — K449 Diaphragmatic hernia without obstruction or gangrene: Secondary | ICD-10-CM | POA: Diagnosis not present

## 2018-12-13 MED ORDER — PANTOPRAZOLE SODIUM 40 MG PO TBEC: 40.0000 mg | DELAYED_RELEASE_TABLET | Freq: Two times a day (BID) | ORAL | 11 refills | Status: DC

## 2018-12-13 NOTE — Progress Notes (Signed)
Primary Care Physician:  Fayrene Helper, MD Primary Gastroenterologist:  Dr. Gala Romney  Pre-Procedure History & Physical: HPI:  Catherine Allen is a 43 y.o. female here for for flare of reflux.  Has not been seen here in several years.  Historically, well-controlled controlled on Protonix 40 mg once daily.  Sometimes takes it twice daily.  Ran out.  Taking over-the-counter Nexium with suboptimal symptom control.  No dysphagia.  She is gained about 14 pounds since her last visit here. EGD in 2012 demonstrated a small hiatal hernia and mild erosive reflux esophagitis.  She has not had any melena or rectal bleeding.  Past Medical History:  Diagnosis Date  . Allergy   . Dyspareunia   . Hemorrhoids   . HTN (hypertension)   . MRSA (methicillin resistant Staphylococcus aureus) 2010   boils thighs  . S/P endoscopy June 2012   Dr. Gala Romney: mild erosive esophagitis, chronic duodenitis, mild gastritis, stop NSAIDs.   . Vaginitis     Past Surgical History:  Procedure Laterality Date  . CESAREAN SECTION  2004    Prior to Admission medications   Medication Sig Start Date End Date Taking? Authorizing Provider  amLODipine (NORVASC) 5 MG tablet TAKE 1 TABLET BY MOUTH EVERY DAY 12/14/17  Yes Fayrene Helper, MD  busPIRone (BUSPAR) 5 MG tablet TAKE 1 TABLET(5 MG) BY MOUTH TWICE DAILY 10/28/18  Yes Fayrene Helper, MD  levonorgestrel-ethinyl estradiol (SEASONALE) 0.15-0.03 MG tablet TAKE 1 TABLET BY MOUTH EVERY DAY 11/28/18  Yes Fayrene Helper, MD  LORazepam (ATIVAN) 1 MG tablet TAKE 1 TABLET(1 MG) BY MOUTH TWICE DAILY AS NEEDED FOR ANXIETY 06/17/18  Yes Fayrene Helper, MD  pantoprazole (PROTONIX) 40 MG tablet Take 1 tablet (40 mg total) by mouth daily. 09/02/18  Yes Fayrene Helper, MD  potassium chloride (K-DUR) 10 MEQ tablet TAKE 1 TABLET(10 MEQ) BY MOUTH TWICE DAILY 12/14/17  Yes Fayrene Helper, MD  pravastatin (PRAVACHOL) 40 MG tablet TAKE 1 TABLET(40 MG) BY MOUTH DAILY  09/25/18  Yes Fayrene Helper, MD  triamterene-hydrochlorothiazide (MAXZIDE) 75-50 MG tablet TAKE 1 TABLET BY MOUTH DAILY 06/17/18  Yes Fayrene Helper, MD  acyclovir (ZOVIRAX) 400 MG tablet TAKE 1 TABLET BY MOUTH TWICE DAILY Patient not taking: Reported on 12/13/2018 09/03/17   Fayrene Helper, MD  LORazepam (ATIVAN) 1 MG tablet TAKE 1 TABLET(1 MG) BY MOUTH TWICE DAILY AS NEEDED FOR ANXIETY(10 TABLETS MUST LAST A MINIMUM OF 30 DAYS) 11/28/18   Fayrene Helper, MD    Allergies as of 12/13/2018  . (No Known Allergies)    Family History  Problem Relation Age of Onset  . Hypertension Mother   . Asthma Sister     Social History   Socioeconomic History  . Marital status: Single    Spouse name: Not on file  . Number of children: 2  . Years of education: Not on file  . Highest education level: Not on file  Occupational History  . Occupation: homemaker  Social Needs  . Financial resource strain: Not on file  . Food insecurity    Worry: Not on file    Inability: Not on file  . Transportation needs    Medical: Not on file    Non-medical: Not on file  Tobacco Use  . Smoking status: Never Smoker  . Smokeless tobacco: Never Used  Substance and Sexual Activity  . Alcohol use: No  . Drug use: No  . Sexual activity: Yes  Partners: Male    Birth control/protection: Pill  Lifestyle  . Physical activity    Days per week: Not on file    Minutes per session: Not on file  . Stress: Not on file  Relationships  . Social Musician on phone: Not on file    Gets together: Not on file    Attends religious service: Not on file    Active member of club or organization: Not on file    Attends meetings of clubs or organizations: Not on file    Relationship status: Not on file  . Intimate partner violence    Fear of current or ex partner: Not on file    Emotionally abused: Not on file    Physically abused: Not on file    Forced sexual activity: Not on file  Other  Topics Concern  . Not on file  Social History Narrative   [employer] w/ 2 sons 8 &16    Review of Systems: See HPI, otherwise negative ROS  Physical Exam: BP 131/80   Pulse 89   Temp (!) 96.2 F (35.7 C) (Temporal)   Ht 5\' 2"  (1.575 m)   Wt 179 lb (81.2 kg)   BMI 32.74 kg/m  General:   Alert,  Well-developed, well-nourished, pleasant and cooperative in NAD Neck:  Supple; no masses or thyromegaly. No significant cervical adenopathy. Lungs:  Clear throughout to auscultation.   No wheezes, crackles, or rhonchi. No acute distress. Heart:  Regular rate and rhythm; no murmurs, clicks, rubs,  or gallops. Abdomen: Non-distended, normal bowel sounds.  Soft and nontender without appreciable mass or hepatosplenomegaly.  Pulses:  Normal pulses noted. Extremities:  Without clubbing or edema.  Impression/Plan: Pleasant 43 year old lady with longstanding GERD history of known mild erosive reflux esophagitis and small hiatal hernia seen on prior EGD. We discussed the chronic nature GERD.  She does not have any alarm symptoms.  I suspect her progressive weight gain has contributed to flare in symptoms. Patient did have questions regarding her hiatal hernia.  It was small.  Nothing there needs to be done other than controlling her reflux. I discussed the positive correlation between progressing obesity and worsening reflux symptoms.    Recommendations:   Increase Protonix to 40 mg twice daily (disp 60 with 11 refills)  Weight loss discussed (15 pounds in next 3 months)  GERD infomation provided  OV herr in 3 months   Notice: This dictation was prepared with Dragon dictation along with smaller phrase technology. Any transcriptional errors that result from this process are unintentional and may not be corrected upon review.

## 2018-12-13 NOTE — Patient Instructions (Signed)
Increase Protonix to 40 mg twice daily (disp 60 with 11 refills)  Weight loss discussed (15 pounds in next 3 months)  GERD infomation provided  OV herr in 3 months

## 2018-12-27 ENCOUNTER — Other Ambulatory Visit: Payer: Self-pay

## 2018-12-27 ENCOUNTER — Ambulatory Visit (HOSPITAL_COMMUNITY)
Admission: RE | Admit: 2018-12-27 | Discharge: 2018-12-27 | Disposition: A | Discharge status: Home / Self Care | Payer: Medicaid Other | Source: Ambulatory Visit | Attending: Family Medicine | Admitting: Family Medicine

## 2018-12-27 DIAGNOSIS — Z1231 Encounter for screening mammogram for malignant neoplasm of breast: Secondary | ICD-10-CM

## 2018-12-29 ENCOUNTER — Other Ambulatory Visit: Payer: Self-pay | Admitting: Family Medicine

## 2019-01-17 ENCOUNTER — Other Ambulatory Visit: Payer: Self-pay | Admitting: Family Medicine

## 2019-01-22 ENCOUNTER — Other Ambulatory Visit: Payer: Self-pay | Admitting: Family Medicine

## 2019-01-23 ENCOUNTER — Other Ambulatory Visit: Payer: Self-pay

## 2019-01-23 ENCOUNTER — Telehealth: Payer: Self-pay | Admitting: *Deleted

## 2019-01-23 DIAGNOSIS — E559 Vitamin D deficiency, unspecified: Secondary | ICD-10-CM

## 2019-01-23 DIAGNOSIS — E785 Hyperlipidemia, unspecified: Secondary | ICD-10-CM

## 2019-01-23 DIAGNOSIS — I10 Essential (primary) hypertension: Secondary | ICD-10-CM

## 2019-01-23 NOTE — Telephone Encounter (Signed)
Expired labs reordered  

## 2019-01-23 NOTE — Telephone Encounter (Signed)
Update lab order for pt she is going tomorrow to have drawn

## 2019-01-24 DIAGNOSIS — E785 Hyperlipidemia, unspecified: Secondary | ICD-10-CM | POA: Diagnosis not present

## 2019-01-24 DIAGNOSIS — E559 Vitamin D deficiency, unspecified: Secondary | ICD-10-CM | POA: Diagnosis not present

## 2019-01-24 DIAGNOSIS — I10 Essential (primary) hypertension: Secondary | ICD-10-CM | POA: Diagnosis not present

## 2019-01-25 ENCOUNTER — Encounter: Payer: Self-pay | Admitting: Family Medicine

## 2019-01-25 ENCOUNTER — Other Ambulatory Visit: Payer: Self-pay | Admitting: Family Medicine

## 2019-01-25 LAB — COMPLETE METABOLIC PANEL WITH GFR
AG Ratio: 1.5 (calc) (ref 1.0–2.5)
ALT: 14 U/L (ref 6–29)
AST: 16 U/L (ref 10–30)
Albumin: 3.9 g/dL (ref 3.6–5.1)
Alkaline phosphatase (APISO): 58 U/L (ref 31–125)
BUN: 10 mg/dL (ref 7–25)
CO2: 24 mmol/L (ref 20–32)
Calcium: 8.9 mg/dL (ref 8.6–10.2)
Chloride: 105 mmol/L (ref 98–110)
Creat: 1.07 mg/dL (ref 0.50–1.10)
GFR, Est African American: 73 mL/min/{1.73_m2} (ref >=60)
GFR, Est Non African American: 63 mL/min/{1.73_m2} (ref >=60)
Globulin: 2.6 g/dL (calc) (ref 1.9–3.7)
Glucose, Bld: 85 mg/dL (ref 65–99)
Potassium: 3.6 mmol/L (ref 3.5–5.3)
Sodium: 139 mmol/L (ref 135–146)
Total Bilirubin: 0.5 mg/dL (ref 0.2–1.2)
Total Protein: 6.5 g/dL (ref 6.1–8.1)

## 2019-01-25 LAB — VITAMIN D 25 HYDROXY (VIT D DEFICIENCY, FRACTURES): Vit D, 25-Hydroxy: 66 ng/mL (ref 30–100)

## 2019-01-25 LAB — LIPID PANEL
Cholesterol: 181 mg/dL (ref <200)
HDL: 43 mg/dL — ABNORMAL LOW (ref >=50)
LDL Cholesterol (Calc): 116 mg/dL (calc) — ABNORMAL HIGH
Non-HDL Cholesterol (Calc): 138 mg/dL (calc) — ABNORMAL HIGH (ref <130)
Total CHOL/HDL Ratio: 4.2 (calc) (ref <5.0)
Triglycerides: 111 mg/dL (ref <150)

## 2019-01-28 ENCOUNTER — Other Ambulatory Visit: Payer: Self-pay

## 2019-01-28 ENCOUNTER — Ambulatory Visit (INDEPENDENT_AMBULATORY_CARE_PROVIDER_SITE_OTHER): Payer: Medicaid Other | Admitting: Family Medicine

## 2019-01-28 ENCOUNTER — Encounter: Payer: Self-pay | Admitting: Family Medicine

## 2019-01-28 VITALS — BP 131/80 | Ht 62.0 in | Wt 179.0 lb

## 2019-01-28 DIAGNOSIS — E663 Overweight: Secondary | ICD-10-CM

## 2019-01-28 DIAGNOSIS — F411 Generalized anxiety disorder: Secondary | ICD-10-CM | POA: Diagnosis not present

## 2019-01-28 DIAGNOSIS — E785 Hyperlipidemia, unspecified: Secondary | ICD-10-CM

## 2019-01-28 DIAGNOSIS — F410 Panic disorder [episodic paroxysmal anxiety] without agoraphobia: Secondary | ICD-10-CM

## 2019-01-28 DIAGNOSIS — J3089 Other allergic rhinitis: Secondary | ICD-10-CM | POA: Diagnosis not present

## 2019-01-28 DIAGNOSIS — I10 Essential (primary) hypertension: Secondary | ICD-10-CM

## 2019-01-28 MED ORDER — LORAZEPAM 1 MG PO TABS: ORAL_TABLET | ORAL | 5 refills | Status: DC

## 2019-01-28 NOTE — Progress Notes (Signed)
Virtual Visit via Telephone Note  I connected with Catherine Allen on 01/28/19 at 10:00 AM EST by telephone and verified that I am speaking with the correct person using two identifiers.  Location: Patient: home Provider: office   I discussed the limitations, risks, security and privacy concerns of performing an evaluation and management service by telephone and the availability of in person appointments. I also discussed with the patient that there may be a patient responsible charge related to this service. The patient expressed understanding and agreed to proceed.   History of Present Illness:   F/U chronic problems, medication review, and refill medication when necessary. Review most recent labs and order labs which are due Review preventive health and update with necessary referrals or immunizations as indicated Reports doing well, with no concerns Denies recent fever or chills. Denies sinus pressure, nasal congestion, ear pain or sore throat. Denies chest congestion, productive cough or wheezing. Denies chest pains, palpitations and leg swelling Denies abdominal pain, nausea, vomiting,diarrhea or constipation.   Denies dysuria, frequency, hesitancy or incontinence. Denies joint pain, swelling and limitation in mobility. Denies headaches, seizures, numbness, or tingling. Denies depression,   reports  increased anxiety denies  insomnia. Denies skin break down or rash.     Observations/Objective: BP 131/80   Ht 5\' 2"  (1.575 m)   Wt 179 lb (81.2 kg)   BMI 32.74 kg/m  Good communication with no confusion and intact memory. Alert and oriented x 3 No signs of respiratory distress during speech    Assessment and Plan:  Essential hypertension Controlled, no change in medication DASH diet and commitment to daily physical activity for a minimum of 30 minutes discussed and encouraged, as a part of hypertension management. The importance of attaining a healthy weight is also  discussed.  BP/Weight 01/28/2019 12/13/2018 08/05/2018 12/12/2017 11/22/2017 08/15/2017 06/06/2017  Systolic BP 131 131 110 131 130 130 118  Diastolic BP 80 80 68 91 90 80 84  Wt. (Lbs) 179 179 177.04 169 172 167 163.8  BMI 32.74 32.74 31.36 29.94 30.47 29.58 29.02       Allergic rhinitis Controlled, no change in medication   Hyperlipemia Hyperlipidemia:Low fat diet discussed and encouraged.   Lipid Panel  Lab Results  Component Value Date   CHOL 181 01/24/2019   HDL 43 (L) 01/24/2019   LDLCALC 116 (H) 01/24/2019   TRIG 111 01/24/2019   CHOLHDL 4.2 01/24/2019   Needs to reduce fried and fatty foods Updated lab needed at/ before next visit.     GAD (generalized anxiety disorder) Controlled, no change in medication   Overweight (BMI 25.0-29.9)  Patient re-educated about  the importance of commitment to a  minimum of 150 minutes of exercise per week as able.  The importance of healthy food choices with portion control discussed, as well as eating regularly and within a 12 hour window most days. The need to choose "clean , green" food 50 to 75% of the time is discussed, as well as to make water the primary drink and set a goal of 64 ounces water daily.    Weight /BMI 01/28/2019 12/13/2018 08/05/2018  WEIGHT 179 lb 179 lb 177 lb 0.6 oz  HEIGHT 5\' 2"  5\' 2"  5\' 3"   BMI 32.74 kg/m2 32.74 kg/m2 31.36 kg/m2      Panic attacks Unchanged and adequately controlled on current regime \ Follow Up Instructions:    I discussed the assessment and treatment plan with the patient. The patient was provided an  opportunity to ask questions and all were answered. The patient agreed with the plan and demonstrated an understanding of the instructions.   The patient was advised to call back or seek an in-person evaluation if the symptoms worsen or if the condition fails to improve as anticipated.  I provided 15  minutes of non-face-to-face time during this encounter.   Tula Nakayama,  MD

## 2019-01-28 NOTE — Patient Instructions (Addendum)
ANNUAL PHYSICAL EXAM IN OFFICE WITH Md IN 6 MONTHS, CALL IF YOU NEED ME BEFORE  PLEASE REDUCE FRIED AND FATTY FOODS  It is important that you exercise regularly at least 30 minutes 5 times a week. If you develop chest pain, have severe difficulty breathing, or feel very tired, stop exercising immediately and seek medical attention   Fasting lipid, cmp and eGFr, cBC and tsh 1 week before next appointment   Take vit D 3  , 5000 IU 3 days per week until done, after this take 1000 IU every day   Think about what you will eat, plan ahead. Choose " clean, green, fresh or frozen" over canned, processed or packaged foods which are more sugary, salty and fatty. 70 to 75% of food eaten should be vegetables and fruit. Three meals at set times with snacks allowed between meals, but they must be fruit or vegetables. Aim to eat over a 12 hour period , example 7 am to 7 pm, and STOP after  your last meal of the day. Drink water,generally about 64 ounces per day, no other drink is as healthy. Fruit juice is best enjoyed in a healthy way, by EATING the fruit.   PLEASE CONSIDER COVID VACCINE

## 2019-02-01 ENCOUNTER — Encounter: Payer: Self-pay | Admitting: Family Medicine

## 2019-02-01 NOTE — Assessment & Plan Note (Signed)
Hyperlipidemia:Low fat diet discussed and encouraged.   Lipid Panel  Lab Results  Component Value Date   CHOL 181 01/24/2019   HDL 43 (L) 01/24/2019   LDLCALC 116 (H) 01/24/2019   TRIG 111 01/24/2019   CHOLHDL 4.2 01/24/2019   Needs to reduce fried and fatty foods Updated lab needed at/ before next visit.

## 2019-02-01 NOTE — Assessment & Plan Note (Signed)
Unchanged and adequately controlled on current regime 

## 2019-02-01 NOTE — Assessment & Plan Note (Signed)
Controlled, no change in medication  

## 2019-02-01 NOTE — Assessment & Plan Note (Signed)
  Patient re-educated about  the importance of commitment to a  minimum of 150 minutes of exercise per week as able.  The importance of healthy food choices with portion control discussed, as well as eating regularly and within a 12 hour window most days. The need to choose "clean , green" food 50 to 75% of the time is discussed, as well as to make water the primary drink and set a goal of 64 ounces water daily.    Weight /BMI 01/28/2019 12/13/2018 08/05/2018  WEIGHT 179 lb 179 lb 177 lb 0.6 oz  HEIGHT 5\' 2"  5\' 2"  5\' 3"   BMI 32.74 kg/m2 32.74 kg/m2 31.36 kg/m2

## 2019-02-01 NOTE — Assessment & Plan Note (Signed)
Controlled, no change in medication DASH diet and commitment to daily physical activity for a minimum of 30 minutes discussed and encouraged, as a part of hypertension management. The importance of attaining a healthy weight is also discussed.  BP/Weight 01/28/2019 12/13/2018 08/05/2018 12/12/2017 11/22/2017 08/15/2017 06/06/2017  Systolic BP 131 131 110 131 130 130 118  Diastolic BP 80 80 68 91 90 80 84  Wt. (Lbs) 179 179 177.04 169 172 167 163.8  BMI 32.74 32.74 31.36 29.94 30.47 29.58 29.02

## 2019-02-27 ENCOUNTER — Other Ambulatory Visit: Payer: Self-pay | Admitting: Family Medicine

## 2019-03-13 ENCOUNTER — Encounter: Payer: Self-pay | Admitting: Family Medicine

## 2019-03-14 ENCOUNTER — Encounter: Payer: Self-pay | Admitting: Family Medicine

## 2019-03-14 ENCOUNTER — Ambulatory Visit (INDEPENDENT_AMBULATORY_CARE_PROVIDER_SITE_OTHER): Payer: Medicaid Other | Admitting: Family Medicine

## 2019-03-14 ENCOUNTER — Other Ambulatory Visit: Payer: Self-pay

## 2019-03-14 DIAGNOSIS — R05 Cough: Secondary | ICD-10-CM

## 2019-03-14 DIAGNOSIS — R059 Cough, unspecified: Secondary | ICD-10-CM | POA: Insufficient documentation

## 2019-03-14 DIAGNOSIS — J040 Acute laryngitis: Secondary | ICD-10-CM

## 2019-03-14 MED ORDER — PROMETHAZINE-DM 6.25-15 MG/5ML PO SYRP: 2.5000 mL | ORAL_SOLUTION | Freq: Two times a day (BID) | ORAL | 0 refills | Status: DC | PRN

## 2019-03-14 MED ORDER — PREDNISONE 10 MG (21) PO TBPK: ORAL_TABLET | ORAL | 0 refills | Status: DC

## 2019-03-14 NOTE — Patient Instructions (Signed)
I appreciate the opportunity to provide you with care for your health and wellness. Today we discussed: Laryngitis and congestion cough  Follow up: As already scheduled  No labs or referrals today  Please take medication as directed call us if you do not feel any better.  Please continue to practice social distancing to keep you, your family, and our community safe.  If you must go out, please wear a mask and practice good handwashing.  It was a pleasure to see you and I look forward to continuing to work together on your health and well-being. Please do not hesitate to call the office if you need care or have questions about your care.  Have a wonderful day and week. With Gratitude, Tereasa Coop, DNP, AGNP-BC

## 2019-03-14 NOTE — Assessment & Plan Note (Addendum)
Provided with cough syrup to help with cough as it gets worse at night and keeps her up.  Reviewed side effects, risks and benefits of medication.   Patient acknowledged agreement and understanding of the plan.

## 2019-03-14 NOTE — Progress Notes (Signed)
Virtual Visit via Telephone Note   This visit type was conducted due to national recommendations for restrictions regarding the COVID-19 Pandemic (e.g. social distancing) in an effort to limit this patient's exposure and mitigate transmission in our community.  Due to her co-morbid illnesses, this patient is at least at moderate risk for complications without adequate follow up.  This format is felt to be most appropriate for this patient at this time.  The patient did not have access to video technology/had technical difficulties with video requiring transitioning to audio format only (telephone).  All issues noted in this document were discussed and addressed.  No physical exam could be performed with this format.    Evaluation Performed:  Follow-up visit  Date:  03/14/2019   ID:  Catherine Allen, DOB 01/20/1975, MRN [id number]  Patient Location: Home Provider Location: Office  Location of Patient: Home Location of Provider: Telehealth Consent was obtain for visit to be over via telehealth. I verified that I am speaking with the correct person using two identifiers.  PCP:  Kerri Perches, MD   Chief Complaint:  Loss voice and congestion/cough  History of Present Illness:    Catherine Allen is a 44 y.o. female with history of hypertension, allergies.  Presents today with hoarseness, congestion, cough. Reports onset was Sunday with sneezing and questionable allergy flareup.  Is been on Xyzal reports that she is not tolerating this very well is making her very sleepy.  Needs to go back on something else.  She reports she woke up in the last 24 hours and has lost her voice.  She denies having any exposure to Covid.  She reports that she has a cough that has some congestion has been using Mucinex without much relief.  No sore throat no fevers no chills.  Some sneezing still.  Stuffy congested nose.  No itchy watery eyes.  Chest congestion starting.  No aches or pains.  The patient  does not have symptoms concerning for COVID-19 infection (fever, chills, cough, or new shortness of breath).   Past Medical, Surgical, Social History, Allergies, and Medications have been Reviewed.  Past Medical History:  Diagnosis Date  . Allergy   . Dyspareunia   . Hemorrhoids   . HTN (hypertension)   . MRSA (methicillin resistant Staphylococcus aureus) 2010   boils thighs  . S/P endoscopy June 2012   Dr. Jena Gauss: mild erosive esophagitis, chronic duodenitis, mild gastritis, stop NSAIDs.   . Vaginitis    Past Surgical History:  Procedure Laterality Date  . CESAREAN SECTION  2004     Current Meds  Medication Sig  . acyclovir (ZOVIRAX) 400 MG tablet TAKE 1 TABLET BY MOUTH TWICE DAILY  . amLODipine (NORVASC) 5 MG tablet TAKE 1 TABLET BY MOUTH EVERY DAY  . busPIRone (BUSPAR) 5 MG tablet TAKE 1 TABLET(5 MG) BY MOUTH TWICE DAILY  . levonorgestrel-ethinyl estradiol (SEASONALE) 0.15-0.03 MG tablet TAKE 1 TABLET BY MOUTH EVERY DAY  . LORazepam (ATIVAN) 1 MG tablet Take one tablet by mouth two times daily as needed, for uncontrolled anxiety  . pantoprazole (PROTONIX) 40 MG tablet Take 1 tablet (40 mg total) by mouth 2 (two) times daily.  . potassium chloride (KLOR-CON) 10 MEQ tablet TAKE 1 TABLET(10 MEQ) BY MOUTH TWICE DAILY  . pravastatin (PRAVACHOL) 40 MG tablet TAKE 1 TABLET(40 MG) BY MOUTH DAILY  . triamterene-hydrochlorothiazide (MAXZIDE) 75-50 MG tablet TAKE 1 TABLET BY MOUTH DAILY     Allergies:   Patient  has no known allergies.   ROS:   Please see the history of present illness.    All other systems reviewed and are negative.   Labs/Other Tests and Data Reviewed:    Recent Labs: 07/30/2018: Hemoglobin 13.4; Platelets 367; TSH 2.00 01/24/2019: ALT 14; BUN 10; Creat 1.07; Potassium 3.6; Sodium 139   Recent Lipid Panel Lab Results  Component Value Date/Time   CHOL 181 01/24/2019 09:02 AM   TRIG 111 01/24/2019 09:02 AM   HDL 43 (L) 01/24/2019 09:02 AM   CHOLHDL 4.2  01/24/2019 09:02 AM   LDLCALC 116 (H) 01/24/2019 09:02 AM    Wt Readings from Last 3 Encounters:  01/28/19 179 lb (81.2 kg)  12/13/18 179 lb (81.2 kg)  08/05/18 177 lb 0.6 oz (80.3 kg)     Objective:    Vital Signs:  There were no vitals taken for this visit.   GEN:  alert and oriented  RESPIRATORY:   no shortness of breath in conversation, cough noted, and hoarseness  PSYCH:  normal affect and mood   ASSESSMENT & PLAN:    1. Laryngitis  - predniSONE (STERAPRED UNI-PAK 21 TAB) 10 MG (21) TBPK tablet; Take as directed  Dispense: 21 tablet; Refill: 0  2. Cough  - predniSONE (STERAPRED UNI-PAK 21 TAB) 10 MG (21) TBPK tablet; Take as directed  Dispense: 21 tablet; Refill: 0 - promethazine-dextromethorphan (PROMETHAZINE-DM) 6.25-15 MG/5ML syrup; Take 2.5 mLs by mouth 2 (two) times daily as needed for cough.  Dispense: 118 mL; Refill: 0   Time:   Today, I have spent 15  minutes with the patient with telehealth technology discussing the above problems.     Medication Adjustments/Labs and Tests Ordered: Current medicines are reviewed at length with the patient today.  Concerns regarding medicines are outlined above.   Tests Ordered: No orders of the defined types were placed in this encounter.   Medication Changes: No orders of the defined types were placed in this encounter.   Disposition:  Follow up 07/28/2019  Signed, Perlie Mayo, NP  03/14/2019 11:44 AM     [place]

## 2019-03-14 NOTE — Assessment & Plan Note (Signed)
Steroid Dosepak provided today to help with inflammation.  Advised to do rotation of Tylenol and ibuprofen as needed.  And to keep her throat lubricated.  Reviewed side effects, risks and benefits of medication.   Patient acknowledged agreement and understanding of the plan.

## 2019-03-18 ENCOUNTER — Ambulatory Visit: Payer: Medicaid Other | Admitting: Internal Medicine

## 2019-03-23 ENCOUNTER — Other Ambulatory Visit: Payer: Self-pay | Admitting: Family Medicine

## 2019-04-07 ENCOUNTER — Ambulatory Visit (INDEPENDENT_AMBULATORY_CARE_PROVIDER_SITE_OTHER): Payer: Medicaid Other | Admitting: Gastroenterology

## 2019-04-07 ENCOUNTER — Other Ambulatory Visit: Payer: Self-pay

## 2019-04-07 ENCOUNTER — Encounter: Payer: Self-pay | Admitting: Gastroenterology

## 2019-04-07 VITALS — BP 131/71 | HR 93 | Temp 97.1°F | Ht 62.0 in | Wt 170.6 lb

## 2019-04-07 DIAGNOSIS — K219 Gastro-esophageal reflux disease without esophagitis: Secondary | ICD-10-CM | POA: Insufficient documentation

## 2019-04-07 DIAGNOSIS — R1310 Dysphagia, unspecified: Secondary | ICD-10-CM

## 2019-04-07 NOTE — Assessment & Plan Note (Signed)
44 year old female with chronic history of GERD and several month history of nonspecific dysphagia with symptoms of food moving down her esophagus slowly.  GERD symptoms are now well controlled on Protonix 40 mg twice daily which was started in December 2020.  Feels her swallowing troubles are improving with Protonix, ensuring she is sitting up straight when eating, and chewing well.  Symptoms are occurring about once a week.   I suspect previous uncontrolled GERD had likely created esophagitis that is resolving now that GERD is well controlled.  She may also have an underlying motility disorder as no foods are getting hung in her esophagus but feels they are moving slowly.  Additionally, cannot rule out esophageal web, ring, or stricture secondary to uncontrolled GERD.  At this time, we will pursue BPE for further evaluation.  Complete BPE Continue Protonix 40 mg 30 minutes before breakfast and dinner. Advise she sit up straight while eating, eat slowly, chew thoroughly, and drinking plenty of liquids with meals. Also advised if something were to get hung in her esophagus and not come up or go down, she should proceed to the emergency room. Follow-up in 6 months.

## 2019-04-07 NOTE — Progress Notes (Signed)
Referring Provider: Fayrene Helper, MD Primary Care Physician:  Fayrene Helper, MD Primary GI Physician: Dr. Gala Romney  Chief Complaint  Patient presents with  . Gastroesophageal Reflux    f/u. pantoprazole has helped    HPI:   Catherine Allen is a 44 y.o. female presenting today for reflux.  She has history of reflux and was last seen in our office on 12/13/2018 for the same.  At that time, it was noted reflux symptoms had previously been controlled on Protonix 40 mg daily, sometimes twice daily but she is currently taking Nexium OTC due to running out of Protonix with suboptimal control.  Denies dysphagia.  Noted 14 pound weight gain since last visit many years ago.  Last EGD in 2012 with small hiatal hernia and mild erosive reflux esophagitis.  She was resumed on Protonix 40 mg twice daily, she was advised on weight loss, and GERD information was provided.  Today:  GERD: Much improved. No breakthrough symptoms.   When eating, sometimes she feels food is going down slow.  No foods hanging in the esophagus.  No regurgitation.  This was present when she saw Dr. Gala Romney in December 2020.  Has started eating less to try to prevent this. Feels the way she sits may influence this. States she used to lay down across the bed and eat. If sitting up straight and chewing well, she does ok most of the time. Occurs maybe once a week. Typically more with solid foods. No trouble with soft foods or liquids. Overall, she feels this is somewhat improved since starting Protonix.   Lost 9 lbs since last visit. States her brother recently passed away so she hasn't been eating as much. She is also more active at her job which she started in December.   No nausea or vomiting. No abdominal pain. No blood in the stool. No black stools. Occasional BC powder about once a month. Usually will try tylenol first. Reports mild constipation for a couple of months which she thinks was related to stress. Stools are  still soft, just some decrease in frequency. She was having a BM every 2 days. Started taking a stool softener last week. This has helped. Having a BM every other day. States she has never been one to have a BM daily and is back to baseline. Trying to eat fruits and vegetables regularly. Drinking plenty of water. Urine is pale yellow to clear.    Past Medical History:  Diagnosis Date  . Allergy   . Dyspareunia   . Hemorrhoids   . HTN (hypertension)   . MRSA (methicillin resistant Staphylococcus aureus) 2010   boils thighs  . S/P endoscopy June 2012   Dr. Gala Romney: mild erosive esophagitis, chronic duodenitis, mild gastritis, stop NSAIDs.   . Vaginitis     Past Surgical History:  Procedure Laterality Date  . CESAREAN SECTION  2004  . ESOPHAGOGASTRODUODENOSCOPY  2012   small hiatal hernia and mild erosive reflux esophagitis    Current Outpatient Medications  Medication Sig Dispense Refill  . acyclovir (ZOVIRAX) 400 MG tablet TAKE 1 TABLET BY MOUTH TWICE DAILY (Patient taking differently: As needed) 60 tablet 3  . amLODipine (NORVASC) 5 MG tablet TAKE 1 TABLET BY MOUTH EVERY DAY 90 tablet 3  . busPIRone (BUSPAR) 5 MG tablet TAKE 1 TABLET(5 MG) BY MOUTH TWICE DAILY 180 tablet 0  . levonorgestrel-ethinyl estradiol (SEASONALE) 0.15-0.03 MG tablet TAKE 1 TABLET BY MOUTH EVERY DAY 91 tablet 0  .  LORazepam (ATIVAN) 1 MG tablet Take one tablet by mouth two times daily as needed, for uncontrolled anxiety 10 tablet 5  . pantoprazole (PROTONIX) 40 MG tablet Take 1 tablet (40 mg total) by mouth 2 (two) times daily. 60 tablet 11  . potassium chloride (KLOR-CON) 10 MEQ tablet TAKE 1 TABLET(10 MEQ) BY MOUTH TWICE DAILY 180 tablet 0  . pravastatin (PRAVACHOL) 40 MG tablet TAKE 1 TABLET(40 MG) BY MOUTH DAILY 90 tablet 0  . triamterene-hydrochlorothiazide (MAXZIDE) 75-50 MG tablet TAKE 1 TABLET BY MOUTH DAILY 90 tablet 3  . promethazine-dextromethorphan (PROMETHAZINE-DM) 6.25-15 MG/5ML syrup Take 2.5 mLs  by mouth 2 (two) times daily as needed for cough. (Patient not taking: Reported on 04/07/2019) 118 mL 0   No current facility-administered medications for this visit.    Allergies as of 04/07/2019  . (No Known Allergies)    Family History  Problem Relation Age of Onset  . Hypertension Mother   . Asthma Sister   . Colon cancer Neg Hx     Social History   Socioeconomic History  . Marital status: Single    Spouse name: Not on file  . Number of children: 2  . Years of education: Not on file  . Highest education level: Not on file  Occupational History  . Occupation: homemaker  Tobacco Use  . Smoking status: Never Smoker  . Smokeless tobacco: Never Used  Substance and Sexual Activity  . Alcohol use: No  . Drug use: No  . Sexual activity: Yes    Partners: Male    Birth control/protection: Pill  Other Topics Concern  . Not on file  Social History Narrative   LIves w/ 2 sons 8 &16   Social Determinants of Health   Financial Resource Strain:   . Difficulty of Paying Living Expenses:   Food Insecurity:   . Worried About Programme researcher, broadcasting/film/video in the Last Year:   . Barista in the Last Year:   Transportation Needs:   . Freight forwarder (Medical):   Marland Kitchen Lack of Transportation (Non-Medical):   Physical Activity:   . Days of Exercise per Week:   . Minutes of Exercise per Session:   Stress:   . Feeling of Stress :   Social Connections:   . Frequency of Communication with Friends and Family:   . Frequency of Social Gatherings with Friends and Family:   . Attends Religious Services:   . Active Member of Clubs or Organizations:   . Attends Banker Meetings:   Marland Kitchen Marital Status:     Review of Systems: Gen: Denies fever, chills, lightheadedness, dizziness, presyncope, syncope. CV: Denies chest pain or palpitations. Resp: Denies shortness of breath or cough. GI: See HPI Derm: Denies rash Heme: Denies bruising or bleeding  Physical Exam: BP 131/71    Pulse 93   Temp (!) 97.1 F (36.2 C) (Oral)   Ht 5\' 2"  (1.575 m)   Wt 170 lb 9.6 oz (77.4 kg)   LMP 03/03/2019 Comment: every 3 months  BMI 31.20 kg/m  General:   Alert and oriented. No distress noted. Pleasant and cooperative.  Head:  Normocephalic and atraumatic. Eyes:  Conjuctiva clear without scleral icterus. Heart:  S1, S2 present without murmurs appreciated. Lungs:  Clear to auscultation bilaterally. No wheezes, rales, or rhonchi. No distress.  Abdomen:  +BS, soft, non-tender and non-distended. No rebound or guarding. No HSM or masses noted. Msk:  Symmetrical without gross deformities. Normal posture. Extremities:  Without edema. Neurologic:  Alert and  oriented x4 Psych: Normal mood and affect.

## 2019-04-07 NOTE — Progress Notes (Unsigned)
g

## 2019-04-07 NOTE — Patient Instructions (Addendum)
We will help arrange a barium pill esophagram for you.  This is an x-ray study to look at your esophagus and swallowing trouble.  Continue taking Protonix 40 mg daily 30 minutes before breakfast and dinner.  Be sure you are sitting up straight while eating, eating slowly, chewing thoroughly, and drinking plenty of liquids with your meals.  Should something hung in your esophagus and not come up or go down, you should proceed to the emergency room.  We will plan to see you back in 6 months.  Call with any worsening symptoms prior.  Ermalinda Memos, PA-C [facility]   Food Choices for Gastroesophageal Reflux Disease, Adult When you have gastroesophageal reflux disease (GERD), the foods you eat and your eating habits are very important. Choosing the right foods can help ease your discomfort. Think about working with a nutrition specialist (dietitian) to help you make good choices. What are tips for following this plan?  Meals  Choose healthy foods that are low in fat, such as fruits, vegetables, whole grains, low-fat dairy products, and lean meat, fish, and poultry.  Eat small meals often instead of 3 large meals a day. Eat your meals slowly, and in a place where you are relaxed. Avoid bending over or lying down until 2-3 hours after eating.  Avoid eating meals 2-3 hours before bed.  Avoid drinking a lot of liquid with meals.  Cook foods using methods other than frying. Bake, grill, or broil food instead.  Avoid or limit: ? Chocolate. ? Peppermint or spearmint. ? Alcohol. ? Pepper. ? Black and decaffeinated coffee. ? Black and decaffeinated tea. ? Bubbly (carbonated) soft drinks. ? Caffeinated energy drinks and soft drinks.  Limit high-fat foods such as: ? Fatty meat or fried foods. ? Whole milk, cream, butter, or ice cream. ? Nuts and nut butters. ? Pastries, donuts, and sweets made with butter or shortening.  Avoid foods that cause symptoms. These foods may  be different for everyone. Common foods that cause symptoms include: ? Tomatoes. ? Oranges, lemons, and limes. ? Peppers. ? Spicy food. ? Onions and garlic. ? Vinegar. Lifestyle  Maintain a healthy weight. Ask your doctor what weight is healthy for you. If you need to lose weight, work with your doctor to do so safely.  Exercise for at least 30 minutes for 5 or more days each week, or as told by your doctor.  Wear loose-fitting clothes.  Do not smoke. If you need help quitting, ask your doctor.  Sleep with the head of your bed higher than your feet. Use a wedge under the mattress or blocks under the bed frame to raise the head of the bed. Summary  When you have gastroesophageal reflux disease (GERD), food and lifestyle choices are very important in easing your symptoms.  Eat small meals often instead of 3 large meals a day. Eat your meals slowly, and in a place where you are relaxed.  Limit high-fat foods such as fatty meat or fried foods.  Avoid bending over or lying down until 2-3 hours after eating.  Avoid peppermint and spearmint, caffeine, alcohol, and chocolate. This information is not intended to replace advice given to you by your health care provider. Make sure you discuss any questions you have with your health care provider. Document Revised: 04/18/2018 Document Reviewed: 02/01/2016 Elsevier Patient Education  2020 [employer].

## 2019-04-07 NOTE — Assessment & Plan Note (Addendum)
44 year old female with chronic history of GERD and mild erosive reflux esophagitis noted on EGD in 2012.  Symptoms are currently well controlled on Protonix 40 mg twice daily.  She does report mild nonspecific dysphagia with sensation of foods going down her esophagus slowly.  Typically occurring with more solid foods and overall feels this is improving since starting Protonix and ensuring she is sitting upright when eating.  Occurring about once a week.  No foods getting hung in her esophagus.  Denies abdominal pain, bright red blood per rectum, or melena.  Query whether previously uncontrolled GERD had created some esophagitis that is resolving now that GERD is well controlled.  Suspect her swallowing difficulties will continue to improve as GERD remains well controlled.  However, cannot rule out development of esophageal web, ring, or stricture or possible esophageal motility disorder.    Continue Protonix 40 mg twice daily 30 minutes before breakfast and dinner. Pursue BPE for further evaluation of nonspecific dysphagia. Follow-up in 6 months.

## 2019-04-07 NOTE — Progress Notes (Signed)
Cc'ed to pcp °

## 2019-04-11 ENCOUNTER — Ambulatory Visit (HOSPITAL_COMMUNITY)
Admission: RE | Admit: 2019-04-11 | Discharge: 2019-04-11 | Disposition: A | Discharge status: Home / Self Care | Payer: Medicaid Other | Source: Ambulatory Visit | Attending: Gastroenterology | Admitting: Gastroenterology

## 2019-04-11 ENCOUNTER — Other Ambulatory Visit: Payer: Self-pay

## 2019-04-11 DIAGNOSIS — R1310 Dysphagia, unspecified: Secondary | ICD-10-CM

## 2019-04-15 ENCOUNTER — Other Ambulatory Visit: Payer: Self-pay

## 2019-04-15 ENCOUNTER — Encounter: Payer: Self-pay | Admitting: Family Medicine

## 2019-04-15 ENCOUNTER — Ambulatory Visit (INDEPENDENT_AMBULATORY_CARE_PROVIDER_SITE_OTHER): Payer: Medicaid Other | Admitting: Family Medicine

## 2019-04-15 VITALS — BP 131/71 | Ht 62.0 in | Wt 170.0 lb

## 2019-04-15 DIAGNOSIS — F411 Generalized anxiety disorder: Secondary | ICD-10-CM

## 2019-04-15 MED ORDER — HYDROXYZINE HCL 25 MG PO TABS: 25.0000 mg | ORAL_TABLET | Freq: Two times a day (BID) | ORAL | 0 refills | Status: DC | PRN

## 2019-04-15 NOTE — Assessment & Plan Note (Signed)
Recent death of her brother, unexpectedly. Reports that she is unable to sleep but she is having anxiety at night.  Was on lorazepam.  Will try hydroxyzine 25 mg twice daily as needed for anxiety.  As I would like to refrain from using lorazepam especially with use at night. Advised if it does not work she can call back about it. Reviewed side effects, risks and benefits of medication.   Patient acknowledged agreement and understanding of the plan.

## 2019-04-15 NOTE — Progress Notes (Signed)
Virtual Visit via Video Note   This visit type was conducted due to national recommendations for restrictions regarding the COVID-19 Pandemic (e.g. social distancing) in an effort to limit this patient's exposure and mitigate transmission in our community.  Due to her co-morbid illnesses, this patient is at least at moderate risk for complications without adequate follow up.  This format is felt to be most appropriate for this patient at this time.  All issues noted in this document were discussed and addressed.  A limited physical exam was performed with this format.    Evaluation Performed:  Follow-up visit  Date:  04/15/2019   ID:  Catherine Allen, DOB 03-01-1975, MRN [id number]  Patient Location: Home Provider Location: Office  Location of Patient: Home Location of Provider: Telehealth Consent was obtain for visit to be over via telehealth. I verified that I am speaking with the correct person using two identifiers.  PCP:  Kerri Perches, MD   Chief Complaint:  anxiety  History of Present Illness:    Catherine Allen is a 44 y.o. female with history of anxiety, panic attacks.  Recently was started on as needed lorazepam.  Has not had any changes since she was started on that but overall at her last visit with Dr. Lodema Hong was doing well.  And then recently lost her brother unexpectedly and the question of the circumstances of how he passed is very distressful to the family and her.  She is calling today to have a visit to see if there is anything that she can do as this anxiety is impacting her sleep.  The patient does not have symptoms concerning for COVID-19 infection (fever, chills, cough, or new shortness of breath).   Past Medical, Surgical, Social History, Allergies, and Medications have been Reviewed.  Past Medical History:  Diagnosis Date  . Allergy   . Dyspareunia   . Hemorrhoids   . HTN (hypertension)   . MRSA (methicillin resistant Staphylococcus aureus)  2010   boils thighs  . S/P endoscopy June 2012   Dr. Jena Gauss: mild erosive esophagitis, chronic duodenitis, mild gastritis, stop NSAIDs.   . Vaginitis    Past Surgical History:  Procedure Laterality Date  . CESAREAN SECTION  2004  . ESOPHAGOGASTRODUODENOSCOPY  2012   small hiatal hernia and mild erosive reflux esophagitis     Current Meds  Medication Sig  . acyclovir (ZOVIRAX) 400 MG tablet TAKE 1 TABLET BY MOUTH TWICE DAILY (Patient taking differently: As needed)  . amLODipine (NORVASC) 5 MG tablet TAKE 1 TABLET BY MOUTH EVERY DAY  . busPIRone (BUSPAR) 5 MG tablet TAKE 1 TABLET(5 MG) BY MOUTH TWICE DAILY  . levonorgestrel-ethinyl estradiol (SEASONALE) 0.15-0.03 MG tablet TAKE 1 TABLET BY MOUTH EVERY DAY  . LORazepam (ATIVAN) 1 MG tablet Take one tablet by mouth two times daily as needed, for uncontrolled anxiety  . pantoprazole (PROTONIX) 40 MG tablet Take 1 tablet (40 mg total) by mouth 2 (two) times daily.  . potassium chloride (KLOR-CON) 10 MEQ tablet TAKE 1 TABLET(10 MEQ) BY MOUTH TWICE DAILY  . pravastatin (PRAVACHOL) 40 MG tablet TAKE 1 TABLET(40 MG) BY MOUTH DAILY  . promethazine-dextromethorphan (PROMETHAZINE-DM) 6.25-15 MG/5ML syrup Take 2.5 mLs by mouth 2 (two) times daily as needed for cough.  . triamterene-hydrochlorothiazide (MAXZIDE) 75-50 MG tablet TAKE 1 TABLET BY MOUTH DAILY     Allergies:   Patient has no known allergies.   ROS:   Please see the history of present illness.  All other systems reviewed and are negative.   Labs/Other Tests and Data Reviewed:    Recent Labs: 07/30/2018: Hemoglobin 13.4; Platelets 367; TSH 2.00 01/24/2019: ALT 14; BUN 10; Creat 1.07; Potassium 3.6; Sodium 139   Recent Lipid Panel Lab Results  Component Value Date/Time   CHOL 181 01/24/2019 09:02 AM   TRIG 111 01/24/2019 09:02 AM   HDL 43 (L) 01/24/2019 09:02 AM   CHOLHDL 4.2 01/24/2019 09:02 AM   LDLCALC 116 (H) 01/24/2019 09:02 AM    Wt Readings from Last 3 Encounters:    04/15/19 170 lb (77.1 kg)  04/07/19 170 lb 9.6 oz (77.4 kg)  01/28/19 179 lb (81.2 kg)     Objective:    Vital Signs:  BP 131/71   Ht 5\' 2"  (1.575 m)   Wt 170 lb (77.1 kg)   BMI 31.09 kg/m    VITAL SIGNS:  reviewed GEN:  alert and oriented RESPIRATORY:  no shortness of breath in conversation PSYCH:  depressed mood and anxious   GAD 7 : Generalized Anxiety Score 04/15/2019 08/05/2018 04/08/2018 07/14/2015  Nervous, Anxious, on Edge 3 1 1 2   Control/stop worrying 3 1 0 2  Worry too much - different things 3 1 0 3  Trouble relaxing 0 0 0 1  Restless 0 0 0 1  Easily annoyed or irritable 0 1 0 1  Afraid - awful might happen 1 0 0 1  Total GAD 7 Score 10 4 1 11   Anxiety Difficulty Not difficult at all - - Somewhat difficult    Depression screen [facility] 2/9 03/14/2019 08/05/2018 12/12/2017 11/22/2017 08/15/2017  Decreased Interest 0 0 0 0 0  Down, Depressed, Hopeless 0 0 0 0 0  PHQ - 2 Score 0 0 0 0 0  Altered sleeping - - 1 1 1   Tired, decreased energy - - 0 0 0  Change in appetite - - 0 0 0  Feeling bad or failure about yourself  - - 0 0 0  Trouble concentrating - - 0 0 0  Moving slowly or fidgety/restless - - 0 0 0  Suicidal thoughts - - 0 0 0  PHQ-9 Score - - 1 1 1   Difficult doing work/chores - - Somewhat difficult Not difficult at all Not difficult at all    ASSESSMENT & PLAN:    1. GAD (generalized anxiety disorder)  - hydrOXYzine (ATARAX/VISTARIL) 25 MG tablet; Take 1 tablet (25 mg total) by mouth 2 (two) times daily as needed for anxiety.  Dispense: 60 tablet; Refill: 0   Time:   Today, I have spent 10 minutes with the patient with telehealth technology discussing the above problems.     Medication Adjustments/Labs and Tests Ordered: Current medicines are reviewed at length with the patient today.  Concerns regarding medicines are outlined above.   Tests Ordered: No orders of the defined types were placed in this encounter.   Medication Changes: Meds ordered this  encounter  Medications  . hydrOXYzine (ATARAX/VISTARIL) 25 MG tablet    Sig: Take 1 tablet (25 mg total) by mouth 2 (two) times daily as needed for anxiety.    Dispense:  60 tablet    Refill:  0    Order Specific Question:   Supervising Provider    Answer:   Fayrene Helper [[id number]]    Disposition:  Follow up 07/28/2019 Signed, Perlie Mayo, NP  04/15/2019 1:44 PM     [facility]

## 2019-04-15 NOTE — Patient Instructions (Addendum)
I appreciate the opportunity to provide you with care for your health and wellness. Today we discussed: Anxiety  Follow up: As scheduled  No labs or referrals today  Please start taking hydroxyzine 25 mg by mouth nightly and you can take it once during the day if needed.  Please continue to practice social distancing to keep you, your family, and our community safe.  If you must go out, please wear a mask and practice good handwashing.  It was a pleasure to see you and I look forward to continuing to work together on your health and well-being. Please do not hesitate to call the office if you need care or have questions about your care.  Have a wonderful day and week. With Gratitude, Cherly Beach, DNP, AGNP-BC   Managing Loss, Adult People experience loss in many different ways throughout their lives. Events such as moving, changing jobs, and losing friends can create a sense of loss. The loss may be as serious as a major health change, divorce, death of a pet, or death of a loved one. All of these types of loss are likely to create a physical and emotional reaction known as grief. Grief is the result of a major change or an absence of something or someone that you count on. Grief is a normal reaction to loss. A variety of factors can affect your grieving experience, including:  The nature of your loss.  Your relationship to what or whom you lost.  Your understanding of grief and how to manage it.  Your support system. How to manage lifestyle changes Keep to your normal routine as much as possible.  If you have trouble focusing or doing normal activities, it is acceptable to take some time away from your normal routine.  Spend time with friends and loved ones.  Eat a healthy diet, get plenty of sleep, and rest when you feel tired. How to recognize changes  The way that you deal with your grief will affect your ability to function as you normally do. When grieving, you may  experience these changes:  Numbness, shock, sadness, anxiety, anger, denial, and guilt.  Thoughts about death.  Unexpected crying.  A physical sensation of emptiness in your stomach.  Problems sleeping and eating.  Tiredness (fatigue).  Loss of interest in normal activities.  Dreaming about or imagining seeing the person who died.  A need to remember what or whom you lost.  Difficulty thinking about anything other than your loss for a period of time.  Relief. If you have been expecting the loss for a while, you may feel a sense of relief when it happens. Follow these instructions at home:  Activity Express your feelings in healthy ways, such as:  Talking with others about your loss. It may be helpful to find others who have had a similar loss, such as a support group.  Writing down your feelings in a journal.  Doing physical activities to release stress and emotional energy.  Doing creative activities like painting, sculpting, or playing or listening to music.  Practicing resilience. This is the ability to recover and adjust after facing challenges. Reading some resources that encourage resilience may help you to learn ways to practice those behaviors. General instructions  Be patient with yourself and others. Allow the grieving process to happen, and remember that grieving takes time. ? It is likely that you may never feel completely done with some grief. You may find a way to move on while still  cherishing memories and feelings about your loss. ? Accepting your loss is a process. It can take months or longer to adjust.  Keep all follow-up visits as told by your health care provider. This is important. Where to find support To get support for managing loss:  Ask your health care provider for help and recommendations, such as grief counseling or therapy.  Think about joining a support group for people who are managing a loss. Where to find more information You can  find more information about managing loss from:  American Society of Clinical Oncology: www.cancer.net  American Psychological Association: [employer] Contact a health care provider if:  Your grief is extreme and keeps getting worse.  You have ongoing grief that does not improve.  Your body shows symptoms of grief, such as illness.  You feel depressed, anxious, or lonely. Get help right away if:  You have thoughts about hurting yourself or others. If you ever feel like you may hurt yourself or others, or have thoughts about taking your own life, get help right away. You can go to your nearest emergency department or call:  Your local emergency services (911 in the [place]  A suicide crisis helpline, such as the National Suicide Prevention Lifeline at 919 182 0820. This is open 24 hours a day. Summary  Grief is the result of a major change or an absence of someone or something that you count on. Grief is a normal reaction to loss.  The depth of grief and the period of recovery depend on the type of loss and your ability to adjust to the change and process your feelings.  Processing grief requires patience and a willingness to accept your feelings and talk about your loss with people who are supportive.  It is important to find resources that work for you and to realize that people experience grief differently. There is not one grieving process that works for everyone in the same way.  Be aware that when grief becomes extreme, it can lead to more severe issues like isolation, depression, anxiety, or suicidal thoughts. Talk with your health care provider if you have any of these issues. This information is not intended to replace advice given to you by your health care provider. Make sure you discuss any questions you have with your health care provider. Document Revised: 03/01/2018 Document Reviewed: 05/11/2016 Elsevier Patient Education  2020 [employer].

## 2019-04-29 ENCOUNTER — Other Ambulatory Visit: Payer: Self-pay | Admitting: Family Medicine

## 2019-05-30 ENCOUNTER — Other Ambulatory Visit: Payer: Self-pay | Admitting: *Deleted

## 2019-05-30 MED ORDER — LEVONORGEST-ETH ESTRAD 91-DAY 0.15-0.03 MG PO TABS: 1.0000 | ORAL_TABLET | Freq: Every day | ORAL | 0 refills | Status: DC

## 2019-06-24 ENCOUNTER — Other Ambulatory Visit: Payer: Self-pay | Admitting: Family Medicine

## 2019-06-29 ENCOUNTER — Other Ambulatory Visit: Payer: Self-pay | Admitting: Family Medicine

## 2019-07-14 ENCOUNTER — Other Ambulatory Visit: Payer: Self-pay | Admitting: Family Medicine

## 2019-07-14 DIAGNOSIS — A6004 Herpesviral vulvovaginitis: Secondary | ICD-10-CM

## 2019-07-23 DIAGNOSIS — Z1159 Encounter for screening for other viral diseases: Secondary | ICD-10-CM | POA: Diagnosis not present

## 2019-07-23 DIAGNOSIS — E785 Hyperlipidemia, unspecified: Secondary | ICD-10-CM | POA: Diagnosis not present

## 2019-07-23 DIAGNOSIS — I10 Essential (primary) hypertension: Secondary | ICD-10-CM | POA: Diagnosis not present

## 2019-07-28 ENCOUNTER — Encounter: Payer: Self-pay | Admitting: Family Medicine

## 2019-07-28 ENCOUNTER — Other Ambulatory Visit: Payer: Self-pay | Admitting: Family Medicine

## 2019-07-28 ENCOUNTER — Other Ambulatory Visit (HOSPITAL_COMMUNITY)
Admission: RE | Admit: 2019-07-28 | Discharge: 2019-07-28 | Disposition: A | Discharge status: Home / Self Care | Payer: Medicaid Other | Source: Ambulatory Visit | Attending: Family Medicine | Admitting: Family Medicine

## 2019-07-28 ENCOUNTER — Other Ambulatory Visit: Payer: Self-pay

## 2019-07-28 ENCOUNTER — Ambulatory Visit (INDEPENDENT_AMBULATORY_CARE_PROVIDER_SITE_OTHER): Payer: Medicaid Other | Admitting: Family Medicine

## 2019-07-28 VITALS — BP 126/84 | HR 77 | Temp 99.6°F | Resp 18 | Ht 63.0 in | Wt 174.8 lb

## 2019-07-28 DIAGNOSIS — Z0000 Encounter for general adult medical examination without abnormal findings: Secondary | ICD-10-CM | POA: Diagnosis not present

## 2019-07-28 DIAGNOSIS — E785 Hyperlipidemia, unspecified: Secondary | ICD-10-CM

## 2019-07-28 DIAGNOSIS — Z1272 Encounter for screening for malignant neoplasm of vagina: Secondary | ICD-10-CM | POA: Insufficient documentation

## 2019-07-28 DIAGNOSIS — I10 Essential (primary) hypertension: Secondary | ICD-10-CM | POA: Diagnosis not present

## 2019-07-28 DIAGNOSIS — H25093 Other age-related incipient cataract, bilateral: Secondary | ICD-10-CM | POA: Diagnosis not present

## 2019-07-28 DIAGNOSIS — N760 Acute vaginitis: Secondary | ICD-10-CM

## 2019-07-28 MED ORDER — PRAVASTATIN SODIUM 80 MG PO TABS: 80.0000 mg | ORAL_TABLET | Freq: Every day | ORAL | 3 refills | Status: DC

## 2019-07-28 MED ORDER — LORAZEPAM 1 MG PO TABS
ORAL_TABLET | ORAL | 5 refills | Status: DC
Start: 2019-07-28 — End: 2020-01-28

## 2019-07-28 NOTE — Patient Instructions (Addendum)
F/U in office in  5 months, call if you need me before  Increased pravastatin dose to 80 mg daily start today  Vit D 3, 5000 IU take once per week till done, then after take 1000 IU daily  Pap and specimens sent today , I WILL TREAT WHEN I GET RESULTS, no ulcers seen on exam Hepatitis C screen to be added to recent labs  Fasting lipid, cmp and EGFR 5 days before next appt   Mammogram due in December to be scheduled at checkout   COVID vaccine strongly recommended , so please get this  It is important that you exercise regularly at least 30 minutes 5 times a week. If you develop chest pain, have severe difficulty breathing, or feel very tired, stop exercising immediately and seek medical attention  Think about what you will eat, plan ahead. Choose " clean, green, fresh or frozen" over canned, processed or packaged foods which are more sugary, salty and fatty. 70 to 75% of food eaten should be vegetables and fruit. Three meals at set times with snacks allowed between meals, but they must be fruit or vegetables. Aim to eat over a 12 hour period , example 7 am to 7 pm, and STOP after  your last meal of the day. Drink water,generally about 64 ounces per day, no other drink is as healthy. Fruit juice is best enjoyed in a healthy way, by EATING the fruit. Thanks for choosing [facility], we consider it a privelige to serve you.

## 2019-07-29 ENCOUNTER — Encounter: Payer: Self-pay | Admitting: Family Medicine

## 2019-07-29 LAB — COMPLETE METABOLIC PANEL WITH GFR
AG Ratio: 1.5 (calc) (ref 1.0–2.5)
ALT: 14 U/L (ref 6–29)
AST: 14 U/L (ref 10–30)
Albumin: 3.8 g/dL (ref 3.6–5.1)
Alkaline phosphatase (APISO): 57 U/L (ref 31–125)
BUN: 7 mg/dL (ref 7–25)
CO2: 28 mmol/L (ref 20–32)
Calcium: 8.6 mg/dL (ref 8.6–10.2)
Chloride: 104 mmol/L (ref 98–110)
Creat: 1 mg/dL (ref 0.50–1.10)
GFR, Est African American: 79 mL/min/{1.73_m2} (ref >=60)
GFR, Est Non African American: 68 mL/min/{1.73_m2} (ref >=60)
Globulin: 2.5 g/dL (calc) (ref 1.9–3.7)
Glucose, Bld: 88 mg/dL (ref 65–99)
Potassium: 4 mmol/L (ref 3.5–5.3)
Sodium: 139 mmol/L (ref 135–146)
Total Bilirubin: 0.6 mg/dL (ref 0.2–1.2)
Total Protein: 6.3 g/dL (ref 6.1–8.1)

## 2019-07-29 LAB — LIPID PANEL
Cholesterol: 171 mg/dL (ref <200)
HDL: 45 mg/dL — ABNORMAL LOW (ref >=50)
LDL Cholesterol (Calc): 109 mg/dL (calc) — ABNORMAL HIGH
Non-HDL Cholesterol (Calc): 126 mg/dL (calc) (ref <130)
Total CHOL/HDL Ratio: 3.8 (calc) (ref <5.0)
Triglycerides: 84 mg/dL (ref <150)

## 2019-07-29 LAB — CERVICOVAGINAL ANCILLARY ONLY
Bacterial Vaginitis (gardnerella): NEGATIVE
Candida Glabrata: NEGATIVE
Candida Vaginitis: NEGATIVE
Chlamydia: NEGATIVE
Comment: NEGATIVE
Comment: NEGATIVE
Comment: NEGATIVE
Comment: NEGATIVE
Comment: NEGATIVE
Comment: NORMAL
Neisseria Gonorrhea: NEGATIVE
Trichomonas: NEGATIVE

## 2019-07-29 LAB — CBC
HCT: 41 % (ref 35.0–45.0)
Hemoglobin: 13 g/dL (ref 11.7–15.5)
MCH: 26.7 pg — ABNORMAL LOW (ref 27.0–33.0)
MCHC: 31.7 g/dL — ABNORMAL LOW (ref 32.0–36.0)
MCV: 84.2 fL (ref 80.0–100.0)
MPV: 10.1 fL (ref 7.5–12.5)
Platelets: 357 10*3/uL (ref 140–400)
RBC: 4.87 10*6/uL (ref 3.80–5.10)
RDW: 11.9 % (ref 11.0–15.0)
WBC: 7.8 10*3/uL (ref 3.8–10.8)

## 2019-07-29 LAB — TSH: TSH: 1.99 mIU/L

## 2019-07-29 LAB — TEST AUTHORIZATION

## 2019-07-29 LAB — HEPATITIS C ANTIBODY
Hepatitis C Ab: NONREACTIVE
SIGNAL TO CUT-OFF: 0.01 (ref <1.00)

## 2019-07-29 NOTE — Assessment & Plan Note (Signed)
Swabs sen for culture and wet prep

## 2019-07-29 NOTE — Assessment & Plan Note (Signed)

## 2019-07-29 NOTE — Progress Notes (Signed)
    Catherine Allen     MRN: [id number]      DOB: 1975-04-14  HPI: Patient is in for annual physical exam. C/o increased vaginal discharge and pelvic discomfort x 1 week Recent labs, if available are reviewed. Immunization is reviewed , and  updated if needed.   PE: BP 126/84 (BP Location: Right Arm, Patient Position: Sitting, Cuff Size: Normal)   Pulse 77   Temp 99.6 F (37.6 C) (Oral)   Resp 18   Ht 5\' 3"  (1.6 m)   Wt 174 lb 12.8 oz (79.3 kg)   SpO2 98%   BMI 30.96 kg/m   Pleasant  female, alert and oriented x 3, in no cardio-pulmonary distress. Afebrile. HEENT No facial trauma or asymetry. Sinuses non tender.  Extra occullar muscles intact.. External ears normal, . Neck: supple, no adenopathy,JVD or thyromegaly.No bruits.  Chest: Clear to ascultation bilaterally.No crackles or wheezes. Non tender to palpation  Breast: No asymetry,no masses or lumps. No tenderness. No nipple discharge or inversion. No axillary or supraclavicular adenopathy  Cardiovascular system; Heart sounds normal,  S1 and  S2 ,no S3.  No murmur, or thrill. Apical beat not displaced Peripheral pulses normal.  Abdomen: Soft, non tender, no organomegaly or masses. No bruits. Bowel sounds normal. No guarding, tenderness or rebound.   GU: External genitalia normal female genitalia , normal female distribution of hair. No lesions. Urethral meatus normal in size, no  Prolapse, no lesions visibly  Present. Bladder non tender. Vagina pink and moist , with no visible lesions ,white  discharge present . Adequate pelvic support no  cystocele or rectocele noted Cervix pink and appears healthy, no lesions or ulcerations noted, discharge noted from os Uterus normal size, no adnexal masses, no cervical motion or adnexal tenderness.   Musculoskeletal exam: Full ROM of spine, hips , shoulders and knees. No deformity ,swelling or crepitus noted. No muscle wasting or atrophy.   Neurologic: Cranial  nerves 2 to 12 intact. Power, tone ,sensation and reflexes normal throughout. No disturbance in gait. No tremor.  Skin: Intact, no ulceration, erythema , scaling or rash noted. Pigmentation normal throughout  Psych; Normal mood and affect. Judgement and concentration normal   Assessment & Plan:  Annual physical exam Annual exam as documented. Counseling done  re healthy lifestyle involving commitment to 150 minutes exercise per week, heart healthy diet, and attaining healthy weight.The importance of adequate sleep also discussed. Regular seat belt use and home safety, is also discussed. Changes in health habits are decided on by the patient with goals and time frames  set for achieving them. Immunization and cancer screening needs are specifically addressed at this visit.   Vaginitis and vulvovaginitis Swabs sen for culture and wet prep

## 2019-08-01 LAB — CYTOLOGY - PAP
Chlamydia: NEGATIVE
Comment: NEGATIVE
Comment: NEGATIVE
Comment: NEGATIVE
Comment: NEGATIVE
Comment: NORMAL
Diagnosis: NEGATIVE
HSV1: NEGATIVE
HSV2: NEGATIVE
High risk HPV: NEGATIVE
Neisseria Gonorrhea: NEGATIVE
Trichomonas: NEGATIVE

## 2019-08-07 ENCOUNTER — Other Ambulatory Visit: Payer: Self-pay | Admitting: Family Medicine

## 2019-08-07 DIAGNOSIS — Z1231 Encounter for screening mammogram for malignant neoplasm of breast: Secondary | ICD-10-CM

## 2019-08-28 ENCOUNTER — Encounter: Payer: Self-pay | Admitting: Family Medicine

## 2019-08-28 ENCOUNTER — Other Ambulatory Visit: Payer: Self-pay

## 2019-08-28 MED ORDER — LEVONORGEST-ETH ESTRAD 91-DAY 0.15-0.03 MG PO TABS: 1.0000 | ORAL_TABLET | Freq: Every day | ORAL | 1 refills | Status: DC

## 2019-09-22 ENCOUNTER — Other Ambulatory Visit: Payer: Self-pay | Admitting: Family Medicine

## 2019-10-06 ENCOUNTER — Encounter: Payer: Self-pay | Admitting: Family Medicine

## 2019-10-06 NOTE — Telephone Encounter (Signed)
Pt made an app to follow up w Dr patel 10-07-19

## 2019-10-07 ENCOUNTER — Encounter: Payer: Self-pay | Admitting: Family Medicine

## 2019-10-07 ENCOUNTER — Ambulatory Visit (INDEPENDENT_AMBULATORY_CARE_PROVIDER_SITE_OTHER): Payer: Medicaid Other | Admitting: Internal Medicine

## 2019-10-07 ENCOUNTER — Other Ambulatory Visit: Payer: Self-pay

## 2019-10-07 ENCOUNTER — Encounter: Payer: Self-pay | Admitting: Internal Medicine

## 2019-10-07 VITALS — BP 138/84 | HR 81 | Temp 97.9°F | Resp 16 | Ht 62.0 in | Wt 171.0 lb

## 2019-10-07 DIAGNOSIS — L209 Atopic dermatitis, unspecified: Secondary | ICD-10-CM | POA: Diagnosis not present

## 2019-10-07 MED ORDER — HYDROXYZINE HCL 10 MG PO TABS: 10.0000 mg | ORAL_TABLET | Freq: Three times a day (TID) | ORAL | 0 refills | Status: DC | PRN

## 2019-10-07 NOTE — Patient Instructions (Signed)
Please apply lotion to prevent dry skin. Apply sunscreen lotion during outdoor activities.  Please take Hydroxyzine tablet up to 3 times in a day as needed for itching.  You are advised to use non-fragrance soap and lotion if possible.  Please call us if you have any systemic symptoms, such as fever, chills or muscle aches.

## 2019-10-07 NOTE — Progress Notes (Signed)
Established Patient Office Visit  Subjective:  [name], female    DOB: [birth year]  Age: 44 y.o. MRN: [id number]  CC:  Chief Complaint  Patient presents with  . Acute Visit    pt has had itching since the second covid vaccine in august. She scratches and then gets whelps but they do go away she has tried alcohol cortisone cream and benadryl cream but they always come back     HPI Catherine Allen is a 44 year old female with past medical history of hypertension, hyperlipidemia and seasonal allergies presents for evaluation of itching since second dose of Covid vaccine.  She states that she had a bump in her left arm where the vaccine was administered, which improved after about 5 days.  But she continued to have itching in the left arm, which progressed to right arm and legs occasionally.  She tried to change her soap and lotions to see if that would help.  She also mentions noticing small red bumps over both arms, which get worse with itching.  Patient denies any recent outdoor activities, tick bite or exposure to any new chemical.  She denies fever, chills or muscle aches.  Of note patient has a history of seasonal allergic rhinitis.  Past Medical History:  Diagnosis Date  . Allergy   . Anxiety    Phreesia 07/26/2019  . Dyspareunia   . Hemorrhoids   . HTN (hypertension)   . Hypertension    Phreesia 07/26/2019  . MRSA (methicillin resistant Staphylococcus aureus) 2010   boils thighs  . S/P endoscopy June 2012   Dr. Jena Gauss: mild erosive esophagitis, chronic duodenitis, mild gastritis, stop NSAIDs.   . Vaginitis     Past Surgical History:  Procedure Laterality Date  . CESAREAN SECTION  2004  . CESAREAN SECTION N/A    Phreesia 07/26/2019  . ESOPHAGOGASTRODUODENOSCOPY  2012   small hiatal hernia and mild erosive reflux esophagitis    Family History  Problem Relation Age of Onset  . Hypertension Mother   . Asthma Sister   . Colon cancer Neg Hx      Social History   Socioeconomic History  . Marital status: Single    Spouse name: Not on file  . Number of children: 2  . Years of education: Not on file  . Highest education level: Not on file  Occupational History  . Occupation: homemaker  Tobacco Use  . Smoking status: Never Smoker  . Smokeless tobacco: Never Used  Substance and Sexual Activity  . Alcohol use: No  . Drug use: No  . Sexual activity: Yes    Partners: Male    Birth control/protection: Pill  Other Topics Concern  . Not on file  Social History Narrative   LIves w/ 2 sons 8 &16   Social Determinants of Health   Financial Resource Strain:   . Difficulty of Paying Living Expenses: Not on file  Food Insecurity:   . Worried About Programme researcher, broadcasting/film/video in the Last Year: Not on file  . Ran Out of Food in the Last Year: Not on file  Transportation Needs:   . Lack of Transportation (Medical): Not on file  . Lack of Transportation (Non-Medical): Not on file  Physical Activity:   . Days of Exercise per Week: Not on file  . Minutes of Exercise per Session: Not on file  Stress:   . Feeling of Stress : Not on file  Social Connections:   . Frequency  of Communication with Friends and Family: Not on file  . Frequency of Social Gatherings with Friends and Family: Not on file  . Attends Religious Services: Not on file  . Active Member of Clubs or Organizations: Not on file  . Attends Banker Meetings: Not on file  . Marital Status: Not on file  Intimate Partner Violence:   . Fear of Current or Ex-Partner: Not on file  . Emotionally Abused: Not on file  . Physically Abused: Not on file  . Sexually Abused: Not on file    Outpatient Medications Prior to Visit  Medication Sig Dispense Refill  . acyclovir (ZOVIRAX) 400 MG tablet TAKE 1 TABLET BY MOUTH TWICE DAILY 60 tablet 3  . amLODipine (NORVASC) 5 MG tablet TAKE 1 TABLET BY MOUTH EVERY DAY 90 tablet 3  . busPIRone (BUSPAR) 5 MG tablet TAKE 1 TABLET(5  MG) BY MOUTH TWICE DAILY 180 tablet 0  . levonorgestrel-ethinyl estradiol (SEASONALE) 0.15-0.03 MG tablet Take 1 tablet by mouth daily. 91 tablet 1  . LORazepam (ATIVAN) 1 MG tablet Take one tablet by mouth once daily , as needed, for anxiety 10 tablet 5  . pantoprazole (PROTONIX) 40 MG tablet Take 1 tablet (40 mg total) by mouth 2 (two) times daily. 60 tablet 11  . potassium chloride (KLOR-CON) 10 MEQ tablet TAKE 1 TABLET BY MOUTH TWICE A DAY 60 tablet 3  . pravastatin (PRAVACHOL) 80 MG tablet Take 1 tablet (80 mg total) by mouth daily. 90 tablet 3  . triamterene-hydrochlorothiazide (MAXZIDE) 75-50 MG tablet TAKE 1 TABLET BY MOUTH DAILY 90 tablet 3  . potassium chloride (KLOR-CON) 10 MEQ tablet TAKE 1 TABLET(10 MEQ) BY MOUTH TWICE DAILY (Patient not taking: Reported on 10/07/2019) 180 tablet 0   No facility-administered medications prior to visit.    No Known Allergies  ROS Review of Systems  Constitutional: Negative for chills and fever.  HENT: Negative for congestion, sinus pressure, sinus pain and sore throat.   Eyes: Negative for pain and discharge.  Respiratory: Negative for cough and shortness of breath.   Cardiovascular: Negative for chest pain and palpitations.  Gastrointestinal: Negative for abdominal pain, constipation, diarrhea, nausea and vomiting.  Endocrine: Negative for polydipsia and polyuria.  Genitourinary: Negative for dysuria and hematuria.  Musculoskeletal: Negative for neck pain and neck stiffness.  Skin: Negative for rash.       Itching  Allergic/Immunologic:       Seasonal  Neurological: Negative for dizziness and weakness.  Psychiatric/Behavioral: Negative for agitation and behavioral problems.      Objective:    Physical Exam Vitals reviewed.  Constitutional:      General: She is not in acute distress.    Appearance: She is not diaphoretic.  HENT:     Head: Normocephalic and atraumatic.     Nose: Nose normal. No congestion.     Mouth/Throat:      Mouth: Mucous membranes are moist.     Pharynx: No posterior oropharyngeal erythema.  Eyes:     General: No scleral icterus.    Extraocular Movements: Extraocular movements intact.     Pupils: Pupils are equal, round, and reactive to light.  Cardiovascular:     Rate and Rhythm: Normal rate and regular rhythm.     Heart sounds: No murmur heard.   Pulmonary:     Breath sounds: Normal breath sounds. No wheezing or rales.  Abdominal:     Palpations: Abdomen is soft.     Tenderness: There is no  abdominal tenderness.  Musculoskeletal:     Cervical back: Neck supple. No tenderness.     Right lower leg: No edema.     Left lower leg: No edema.  Skin:    General: Skin is warm.     Comments: Few erythematous papules noted over left arm and forearm, no warmth or swelling in surrounding area  Neurological:     General: No focal deficit present.     Mental Status: She is alert and oriented to person, place, and time.  Psychiatric:        Mood and Affect: Mood normal.        Behavior: Behavior normal.      BP 138/84 (BP Location: Left Arm, Patient Position: Sitting, Cuff Size: Normal)   Pulse 81   Temp 97.9 F (36.6 C) (Temporal)   Resp 16   Ht 5\' 2"  (1.575 m)   Wt 171 lb (77.6 kg)   SpO2 99%   BMI 31.28 kg/m  Wt Readings from Last 3 Encounters:  10/07/19 171 lb (77.6 kg)  07/28/19 174 lb 12.8 oz (79.3 kg)  04/15/19 170 lb (77.1 kg)     Health Maintenance Due  Topic Date Due  . INFLUENZA VACCINE  08/10/2019    There are no preventive care reminders to display for this patient.  Lab Results  Component Value Date   TSH 1.99 07/23/2019   Lab Results  Component Value Date   WBC 7.8 07/23/2019   HGB 13.0 07/23/2019   HCT 41.0 07/23/2019   MCV 84.2 07/23/2019   PLT 357 07/23/2019   Lab Results  Component Value Date   NA 139 07/23/2019   K 4.0 07/23/2019   CO2 28 07/23/2019   GLUCOSE 88 07/23/2019   BUN 7 07/23/2019   CREATININE 1.00 07/23/2019   BILITOT 0.6  07/23/2019   AST 14 07/23/2019   ALT 14 07/23/2019   PROT 6.3 07/23/2019   CALCIUM 8.6 07/23/2019   ANIONGAP 8 10/18/2016   Lab Results  Component Value Date   CHOL 171 07/23/2019   Lab Results  Component Value Date   HDL 45 (L) 07/23/2019   Lab Results  Component Value Date   LDLCALC 109 (H) 07/23/2019   Lab Results  Component Value Date   TRIG 84 07/23/2019   Lab Results  Component Value Date   CHOLHDL 3.8 07/23/2019   Lab Results  Component Value Date   HGBA1C 5.0 06/22/2011      Assessment & Plan:   Atopic dermatitis Advised to apply lotion to avoid dry skin Non-fragrance lotion and soap preferred Atarax as needed for itching Sunscreen during outdoor activities Counseled that the skin reaction as unlikely to be related to the vaccine Advised to call 06/24/2011 if she has systemic symptoms    Problem List Items Addressed This Visit    None    Visit Diagnoses    Atopic dermatitis, unspecified type    -  Primary   Relevant Medications   hydrOXYzine (ATARAX/VISTARIL) 10 MG tablet      Meds ordered this encounter  Medications  . hydrOXYzine (ATARAX/VISTARIL) 10 MG tablet    Sig: Take 1 tablet (10 mg total) by mouth 3 (three) times daily as needed for itching.    Dispense:  30 tablet    Refill:  0    Follow-up: Return if symptoms worsen or fail to improve.    [place], MD

## 2019-10-08 ENCOUNTER — Telehealth: Payer: Self-pay

## 2019-10-08 ENCOUNTER — Other Ambulatory Visit: Payer: Self-pay | Admitting: Internal Medicine

## 2019-10-08 ENCOUNTER — Other Ambulatory Visit: Payer: Self-pay | Admitting: *Deleted

## 2019-10-08 DIAGNOSIS — L309 Dermatitis, unspecified: Secondary | ICD-10-CM

## 2019-10-08 DIAGNOSIS — J3089 Other allergic rhinitis: Secondary | ICD-10-CM

## 2019-10-08 MED ORDER — TRIAMCINOLONE ACETONIDE 0.1 % EX CREA: 1.0000 "application " | TOPICAL_CREAM | Freq: Two times a day (BID) | CUTANEOUS | 0 refills | Status: DC

## 2019-10-08 MED ORDER — DESLORATADINE 5 MG PO TABS: 5.0000 mg | ORAL_TABLET | Freq: Every day | ORAL | 0 refills | Status: DC

## 2019-10-08 NOTE — Telephone Encounter (Signed)
Please see mychart message.

## 2019-10-08 NOTE — Telephone Encounter (Signed)
Called pt and let her know that there is an issue and per marion she told me to send it in under dr simpson that she shouldn't have any issues to let us know if she did with verbal understanding

## 2019-10-08 NOTE — Telephone Encounter (Signed)
Pt sent multiple messages on this medication one was forwarded to Dr Allena Katz

## 2019-10-08 NOTE — Progress Notes (Signed)
Referring Provider: Kerri Perches, MD Primary Care Physician:  Kerri Perches, MD Primary GI Physician: Dr. Jena Gauss  Chief Complaint  Patient presents with   Gastroesophageal Reflux    doing fine   Dysphagia    no problem now    HPI:   Catherine Allen is a 44 y.o. female presenting today for follow-up of GERD and dysphagia.  She was last seen in our office in March 2021 for the same.  Also with history of mild erosive reflux esophagitis on EGD in 2012.  At her last office visit, GERD was well controlled on Protonix 40 mg twice daily.  She described mild nonspecific dysphagia with sensation of foods going down her esophagus slowly and felt this was improving since starting Protonix.  Occurring maybe once a week. Suspected dysphagia was secondary to resolving esophagitis in the setting of previously uncontrolled GERD. Plans to continue Protonix 40 mg daily, BPE for further evaluation of dysphagia, and follow-up in 6 months.  BPE: Normal.   Today:  GERD: No breakthrough reflux symptoms on Protonix BID.   Dysphagia: Rare. Last episode was last Friday. Again, just the sensation of foods moving slowly but not getting hung. Can't remember the last time prior to this.   No abdominal pain. No nausea or vomiting. BMs every other day. Will use MiraLAX if she needs to- maybe once a week or less. This is due to a sensation of needing to go but not having a BM. No blood in the stool. No black stools.   No prior colonoscopy.    Past Medical History:  Diagnosis Date   Allergy    Anxiety    Phreesia 07/26/2019   Dyspareunia    Hemorrhoids    HLD (hyperlipidemia)    HTN (hypertension)    Hypertension    Phreesia 07/26/2019   MRSA (methicillin resistant Staphylococcus aureus) 2010   boils thighs   S/P endoscopy June 2012   Dr. Jena Gauss: mild erosive esophagitis, chronic duodenitis, mild gastritis, stop NSAIDs.    Vaginitis     Past Surgical History:  Procedure  Laterality Date   CESAREAN SECTION  2004   CESAREAN SECTION N/A    Phreesia 07/26/2019   ESOPHAGOGASTRODUODENOSCOPY  2012   small hiatal hernia and mild erosive reflux esophagitis    Current Outpatient Medications  Medication Sig Dispense Refill   acyclovir (ZOVIRAX) 400 MG tablet TAKE 1 TABLET BY MOUTH TWICE DAILY (Patient taking differently: As needed) 60 tablet 3   amLODipine (NORVASC) 5 MG tablet TAKE 1 TABLET BY MOUTH EVERY DAY 90 tablet 3   busPIRone (BUSPAR) 5 MG tablet TAKE 1 TABLET(5 MG) BY MOUTH TWICE DAILY 180 tablet 0   levonorgestrel-ethinyl estradiol (SEASONALE) 0.15-0.03 MG tablet Take 1 tablet by mouth daily. 91 tablet 1   LORazepam (ATIVAN) 1 MG tablet Take one tablet by mouth once daily , as needed, for anxiety 10 tablet 5   pantoprazole (PROTONIX) 40 MG tablet Take 1 tablet (40 mg total) by mouth 2 (two) times daily. 60 tablet 11   potassium chloride (KLOR-CON) 10 MEQ tablet TAKE 1 TABLET BY MOUTH TWICE A DAY 60 tablet 3   pravastatin (PRAVACHOL) 80 MG tablet Take 1 tablet (80 mg total) by mouth daily. 90 tablet 3   triamterene-hydrochlorothiazide (MAXZIDE) 75-50 MG tablet TAKE 1 TABLET BY MOUTH DAILY 90 tablet 3   desloratadine (CLARINEX) 5 MG tablet Take 1 tablet (5 mg total) by mouth daily. 30 tablet 0   triamcinolone  cream (KENALOG) 0.1 % Apply 1 application topically 2 (two) times daily. 30 g 0   No current facility-administered medications for this visit.    Allergies as of 10/09/2019   (No Known Allergies)    Family History  Problem Relation Age of Onset   Hypertension Mother    Asthma Sister    Colon cancer Neg Hx     Social History   Socioeconomic History   Marital status: Single    Spouse name: Not on file   Number of children: 2   Years of education: Not on file   Highest education level: Not on file  Occupational History   Occupation: homemaker  Tobacco Use   Smoking status: Never Smoker   Smokeless tobacco: Never  Used  Substance and Sexual Activity   Alcohol use: No   Drug use: No   Sexual activity: Yes    Partners: Male    Birth control/protection: Pill  Other Topics Concern   Not on file  Social History Narrative   LIves w/ 2 sons 8 &16   Social Determinants of Health   Financial Resource Strain:    Difficulty of Paying Living Expenses: Not on file  Food Insecurity:    Worried About Programme researcher, broadcasting/film/video in the Last Year: Not on file   [employer] of Food in the Last Year: Not on file  Transportation Needs:    Lack of Transportation (Medical): Not on file   Lack of Transportation (Non-Medical): Not on file  Physical Activity:    Days of Exercise per Week: Not on file   Minutes of Exercise per Session: Not on file  Stress:    Feeling of Stress : Not on file  Social Connections:    Frequency of Communication with Friends and Family: Not on file   Frequency of Social Gatherings with Friends and Family: Not on file   Attends Religious Services: Not on file   Active Member of Clubs or Organizations: Not on file   Attends Banker Meetings: Not on file   Marital Status: Not on file    Review of Systems: Gen: Denies fever, chills, cold or flulike symptoms, lightheadedness, dizziness, presyncope, syncope. CV: Denies chest pain or palpitations Resp: Denies dyspnea or cough. GI: See HPI Heme: See HPI  Physical Exam: BP 128/83    Pulse 87    Temp 97.8 F (36.6 C) (Oral)    Ht 5\' 2"  (1.575 m)    Wt 170 lb (77.1 kg)    BMI 31.09 kg/m  General:   Alert and oriented. No distress noted. Pleasant and cooperative.  Head:  Normocephalic and atraumatic. Eyes:  Conjuctiva clear without scleral icterus. Heart:  S1, S2 present without murmurs appreciated. Lungs:  Clear to auscultation bilaterally. No wheezes, rales, or rhonchi. No distress.  Abdomen:  +BS, soft, non-tender and non-distended. No rebound or guarding. No HSM or masses noted. Msk:  Symmetrical without gross  deformities. Normal posture. Extremities:  Without edema. Neurologic:  Alert and  oriented x4 Psych:  Normal mood and affect.

## 2019-10-08 NOTE — Telephone Encounter (Signed)
Please call the pt as she is not getting her prescriptions and the pharmacy is stating Dr is not covered

## 2019-10-09 ENCOUNTER — Other Ambulatory Visit: Payer: Self-pay | Admitting: *Deleted

## 2019-10-09 ENCOUNTER — Telehealth: Payer: Self-pay

## 2019-10-09 ENCOUNTER — Ambulatory Visit (INDEPENDENT_AMBULATORY_CARE_PROVIDER_SITE_OTHER): Payer: Medicaid Other | Admitting: Gastroenterology

## 2019-10-09 ENCOUNTER — Telehealth: Payer: Self-pay | Admitting: *Deleted

## 2019-10-09 ENCOUNTER — Encounter: Payer: Self-pay | Admitting: Gastroenterology

## 2019-10-09 ENCOUNTER — Other Ambulatory Visit: Payer: Self-pay

## 2019-10-09 VITALS — BP 128/83 | HR 87 | Temp 97.8°F | Ht 62.0 in | Wt 170.0 lb

## 2019-10-09 DIAGNOSIS — L309 Dermatitis, unspecified: Secondary | ICD-10-CM

## 2019-10-09 DIAGNOSIS — K219 Gastro-esophageal reflux disease without esophagitis: Secondary | ICD-10-CM | POA: Diagnosis not present

## 2019-10-09 DIAGNOSIS — Z1211 Encounter for screening for malignant neoplasm of colon: Secondary | ICD-10-CM | POA: Diagnosis not present

## 2019-10-09 DIAGNOSIS — R1310 Dysphagia, unspecified: Secondary | ICD-10-CM

## 2019-10-09 DIAGNOSIS — J3089 Other allergic rhinitis: Secondary | ICD-10-CM

## 2019-10-09 MED ORDER — DESLORATADINE 5 MG PO TABS: 5.0000 mg | ORAL_TABLET | Freq: Every day | ORAL | 0 refills | Status: DC

## 2019-10-09 MED ORDER — TRIAMCINOLONE ACETONIDE 0.1 % EX CREA: 1.0000 "application " | TOPICAL_CREAM | Freq: Two times a day (BID) | CUTANEOUS | 0 refills | Status: DC

## 2019-10-09 NOTE — Assessment & Plan Note (Signed)
Well-controlled on Protonix 40 mg twice daily.  History of mild dysphagia symptoms that improved as GERD improved after increasing Protonix to twice daily.  Overall, sensation of foods moving down her esophagus occurs very rarely.  BPE on file in Dajai 2021 within normal limits.  We will plan to continue her Protonix at twice daily dosing for now.  Counseled on GERD diet and lifestyle. Follow-up in 6 months.

## 2019-10-09 NOTE — Telephone Encounter (Signed)
Patient was prescribed allergy medication today. Needed PA. Insurance company denied it. She would like something for allergies and itching. Request came from [facility]

## 2019-10-09 NOTE — Assessment & Plan Note (Signed)
Patient will be turning 44 in November and will be due for first ever screening colonoscopy.  She has no significant lower GI symptoms.  No alarm symptoms.  No family history of colon cancer.  We will proceed with colonoscopy with propofol with Dr. Jena Gauss in the near future. The risks, benefits, and alternatives have been discussed with the patient in detail. The patient states understanding and desires to proceed.  ASA II Follow-up in 6 months.

## 2019-10-09 NOTE — Assessment & Plan Note (Addendum)
Patient had previously described nonspecific dysphagia symptoms with sensation of food moving down her esophagus slowly.  BPE in Ezri 2021 within normal limits.  The symptoms have significantly improved as GERD is now well controlled on Protonix 40 mg twice daily.  Overall, symptoms occur very rarely.  No need for additional intervention/evaluation with EGD at this time.  We will continue Protonix twice daily for now.  Advise she eat slowly, take small bites, chew thoroughly, and drink plenty of liquids throughout your meals.  She will monitor her symptoms and let us know of any worsening.  Plan to follow-up in 6 months.

## 2019-10-09 NOTE — Telephone Encounter (Signed)
Called pt to schedule TCS with propofol, Dr. Jena Gauss, ASA 2 after 11/2. She states she is going to have to call back to schedule

## 2019-10-09 NOTE — Patient Instructions (Signed)
We will get you scheduled for colonoscopy with Dr. Jena Gauss in the near future.  Continue taking Protonix 40 mg twice daily 30 minutes before breakfast and dinner.  Follow a GERD diet:  Avoid fried, fatty, greasy, spicy, citrus foods. Avoid caffeine and carbonated beverages. Avoid chocolate. Do not eat within 3 hours of laying down.  Be sure you are eating slowly, taking small bites, chewing thoroughly, and drinking plenty of liquids throughout your meals.  Please let us know if trouble swallowing worsens prior to your next visit.  We will plan to see back in 6 months.  Do not hesitate to call if you have questions or concerns prior.  Ermalinda Memos, PA-C [facility]

## 2019-10-10 ENCOUNTER — Telehealth: Payer: Self-pay | Admitting: Internal Medicine

## 2019-10-10 ENCOUNTER — Other Ambulatory Visit: Payer: Self-pay | Admitting: Family Medicine

## 2019-10-10 MED ORDER — MONTELUKAST SODIUM 10 MG PO TABS
10.0000 mg | ORAL_TABLET | Freq: Every day | ORAL | 3 refills | Status: DC
Start: 2019-10-10 — End: 2019-10-14

## 2019-10-10 NOTE — Telephone Encounter (Signed)
Dr Allena Katz got her meds covered through [employer]

## 2019-10-10 NOTE — Telephone Encounter (Signed)
PATIENT RETURNED CALL Friday, PLEASE CALL BACK

## 2019-10-10 NOTE — Telephone Encounter (Signed)
Pls let her know singulair is prescribed

## 2019-10-13 ENCOUNTER — Encounter: Payer: Self-pay | Admitting: Family Medicine

## 2019-10-13 NOTE — Telephone Encounter (Signed)
Called pt. She has been scheduled for TCS with propofol, Dr, Jena Gauss, asa 2 for 11/18 at 9:00am. Aware will mail prpe instructions. covid test will mailed as well.    PA for TCS approved via [facility] website. Auth# [id number] dates 11/27/2019-02/25/2020

## 2019-10-14 ENCOUNTER — Ambulatory Visit (INDEPENDENT_AMBULATORY_CARE_PROVIDER_SITE_OTHER): Payer: Medicaid Other | Admitting: Family Medicine

## 2019-10-14 ENCOUNTER — Other Ambulatory Visit: Payer: Self-pay

## 2019-10-14 ENCOUNTER — Encounter: Payer: Self-pay | Admitting: Family Medicine

## 2019-10-14 VITALS — BP 136/82 | HR 79 | Ht 62.0 in | Wt 171.0 lb

## 2019-10-14 DIAGNOSIS — I10 Essential (primary) hypertension: Secondary | ICD-10-CM | POA: Diagnosis not present

## 2019-10-14 DIAGNOSIS — L239 Allergic contact dermatitis, unspecified cause: Secondary | ICD-10-CM | POA: Diagnosis not present

## 2019-10-14 MED ORDER — PREDNISONE 5 MG PO TABS: 5.0000 mg | ORAL_TABLET | Freq: Two times a day (BID) | ORAL | 0 refills | Status: AC

## 2019-10-14 NOTE — Patient Instructions (Signed)
F/U as before, call if you need me sooner  Please take prednisone twice daily for 5 days ( new) along with allegra and clarinex once daily. Use steroid cream sparingly  Start new at bedtime benadryl 1/3 to 1/2 tablet for sleep and to help wit itching  If new rash or ongoing itching persists next week, please sned a message  So that you can be evaluated by Dermatology

## 2019-10-14 NOTE — Progress Notes (Signed)
   Catherine Allen     MRN: [id number]      DOB: 1975/06/27   HPI [name] is here with an approx 6 week h/o itching and rash on both arms about 6 days after covid vaccine # 2 on 08/13. Has not had any purulent drainage, fever or chills Denies difficulty breathing or swallowing, no new products  C/o excess itching keeping her awake ROS Denies recent fever or chills. Denies sinus pressure, nasal congestion, ear pain or sore throat. Denies chest congestion, productive cough or wheezing. Denies chest pains, palpitations and leg swelling   Denies depression,c/o increased  anxiety and  insomnia.    PE  BP 136/82   Pulse 79   Ht 5\' 2"  (1.575 m)   Wt 171 lb (77.6 kg)   SpO2 98%   BMI 31.28 kg/m   Patient alert and oriented and in no cardiopulmonary distress.  HEENT: No facial asymmetry, EOMI,     Neck supple .  Chest: Clear to auscultation bilaterally.  CVS: S1, S2 no murmurs, no S3.Regular rate.  Ext: No edema  MS: Adequate ROM spine, shoulders, hips and knees.  Skin: Ierythematous maculopapular rash on upper extremities rash noted.  Psych: Good eye contact, normal affect. Memory intact not anxious or depressed appearing.  CNS: CN 2-12 intact, power,  normal throughout.no focal deficits noted.   Assessment & Plan  Allergic dermatitis Prednisone x 5 days, bedtime benadryl and spasring use of betamethasone. Refer dermatology if persists  Essential hypertension Controlled, no change in medication

## 2019-10-17 ENCOUNTER — Encounter: Payer: Self-pay | Admitting: Family Medicine

## 2019-10-17 DIAGNOSIS — L239 Allergic contact dermatitis, unspecified cause: Secondary | ICD-10-CM | POA: Insufficient documentation

## 2019-10-17 NOTE — Assessment & Plan Note (Signed)
Prednisone x 5 days, bedtime benadryl and spasring use of betamethasone. Refer dermatology if persists

## 2019-10-17 NOTE — Assessment & Plan Note (Signed)
Controlled, no change in medication  

## 2019-10-21 ENCOUNTER — Encounter: Payer: Self-pay | Admitting: Family Medicine

## 2019-10-21 ENCOUNTER — Other Ambulatory Visit: Payer: Self-pay | Admitting: Family Medicine

## 2019-10-21 DIAGNOSIS — L309 Dermatitis, unspecified: Secondary | ICD-10-CM

## 2019-10-21 NOTE — Progress Notes (Signed)
amb derm

## 2019-10-24 DIAGNOSIS — T148XXA Other injury of unspecified body region, initial encounter: Secondary | ICD-10-CM | POA: Diagnosis not present

## 2019-11-01 ENCOUNTER — Other Ambulatory Visit: Payer: Self-pay | Admitting: Family Medicine

## 2019-11-01 DIAGNOSIS — J3089 Other allergic rhinitis: Secondary | ICD-10-CM

## 2019-11-19 ENCOUNTER — Encounter: Payer: Self-pay | Admitting: Family Medicine

## 2019-11-20 ENCOUNTER — Telehealth (INDEPENDENT_AMBULATORY_CARE_PROVIDER_SITE_OTHER): Payer: Medicaid Other | Admitting: Family Medicine

## 2019-11-20 ENCOUNTER — Other Ambulatory Visit: Payer: Self-pay | Admitting: Family Medicine

## 2019-11-20 DIAGNOSIS — J040 Acute laryngitis: Secondary | ICD-10-CM

## 2019-11-20 DIAGNOSIS — J45901 Unspecified asthma with (acute) exacerbation: Secondary | ICD-10-CM | POA: Diagnosis not present

## 2019-11-20 MED ORDER — BENZONATATE 100 MG PO CAPS: 100.0000 mg | ORAL_CAPSULE | Freq: Two times a day (BID) | ORAL | 0 refills | Status: DC | PRN

## 2019-11-20 MED ORDER — PROMETHAZINE-DM 6.25-15 MG/5ML PO SYRP: ORAL_SOLUTION | ORAL | 0 refills | Status: DC

## 2019-11-20 MED ORDER — PREDNISONE 5 MG (21) PO TBPK: 5.0000 mg | ORAL_TABLET | ORAL | 0 refills | Status: DC

## 2019-11-20 MED ORDER — MONTELUKAST SODIUM 10 MG PO TABS: 10.0000 mg | ORAL_TABLET | Freq: Every day | ORAL | 0 refills | Status: DC

## 2019-11-20 NOTE — Progress Notes (Signed)
Virtual Visit via VIDEO Note  I connected with Catherine Allen on 11/20/19 at  8:40 AM EST by video and verified that I am speaking with the correct person using two identifiers.  Location: Patient: home Provider: office   I discussed the limitations, risks, security and privacy concerns of performing an evaluation and management service by VIDEO and the availability of in person appointments. I also discussed with the patient that there may be a patient responsible charge related to this service. The patient expressed understanding and agreed to proceed.   History of Present Illness: 4-day history of increased cough and chest congestion.  She denies any fever or chills.  Sputum medicines obtained is clear.  She denies sinus pressure however does note that the cough is more when she lies down.  2-day history of loss of once which is painless.  Denies ear pain.  Denies generalized body aches.  Otherwise doing well   Observations/Objective: There were no vitals taken for this visit. Good communication with no confusion and intact memory. Alert and oriented x 3 No signs of respiratory distress during speech Significant hoarseness and loss of voice   Assessment and Plan:  Allergic bronchitis Symptomatic treatment with decongestant Tessalon Perles prednisone and cough suppressant syrup.  She is encouraged to drink fluids and get rest.  She is advised that should fever chills or green sputum develop she is to contact the office.  No antibiotic is prescribed as none is indicated at this time.  Acute laryngitis For stress, fluid intake of 64 ounces of water or more, short course of prednisone.   Follow Up Instructions:    I discussed the assessment and treatment plan with the patient. The patient was provided an opportunity to ask questions and all were answered. The patient agreed with the plan and demonstrated an understanding of the instructions.   The patient was advised to  call back or seek an in-person evaluation if the symptoms worsen or if the condition fails to improve as anticipated.  I provided 14 minutes of non-face-to-face time during this encounter.   Syliva Overman, MD

## 2019-11-20 NOTE — Patient Instructions (Addendum)
F/U as before , call if you need me sooner  You are treated FOR ACUTE BRONCHITIS AND LARYNGITISWITH DECONGESTANT, TESSALON PERLE, PREDNISONE, COUGH SUPPRESSANT SYRUP AND AN ALLERGY TABLET, SINGULAIR. NO ANTIBIOTIC IS INDICATED OR PRESCRIBED AT THIS TIME  IF YOU DEVELOP FEVER, CHILLS, GREEN SPUTUM, BODY ACHES CALL BACK  FRINK A LOT OF WATER , AND REST YOUR VOIVE

## 2019-11-21 ENCOUNTER — Ambulatory Visit: Payer: Medicaid Other

## 2019-11-23 ENCOUNTER — Encounter: Payer: Self-pay | Admitting: Family Medicine

## 2019-11-23 DIAGNOSIS — J040 Acute laryngitis: Secondary | ICD-10-CM | POA: Insufficient documentation

## 2019-11-23 DIAGNOSIS — J45909 Unspecified asthma, uncomplicated: Secondary | ICD-10-CM | POA: Insufficient documentation

## 2019-11-23 NOTE — Assessment & Plan Note (Signed)
For stress, fluid intake of 64 ounces of water or more, short course of prednisone.

## 2019-11-23 NOTE — Assessment & Plan Note (Signed)
Symptomatic treatment with decongestant Tessalon Perles prednisone and cough suppressant syrup.  She is encouraged to drink fluids and get rest.  She is advised that should fever chills or green sputum develop she is to contact the office.  No antibiotic is prescribed as none is indicated at this time.

## 2019-11-25 ENCOUNTER — Encounter (HOSPITAL_COMMUNITY): Payer: Self-pay | Admitting: Internal Medicine

## 2019-11-26 ENCOUNTER — Other Ambulatory Visit (HOSPITAL_COMMUNITY)
Admission: RE | Admit: 2019-11-26 | Discharge: 2019-11-26 | Disposition: A | Discharge status: Home / Self Care | Payer: Medicaid Other | Source: Ambulatory Visit | Attending: Internal Medicine | Admitting: Internal Medicine

## 2019-11-26 ENCOUNTER — Other Ambulatory Visit: Payer: Self-pay

## 2019-11-26 DIAGNOSIS — Z01812 Encounter for preprocedural laboratory examination: Secondary | ICD-10-CM | POA: Diagnosis present

## 2019-11-26 DIAGNOSIS — Z20822 Contact with and (suspected) exposure to covid-19: Secondary | ICD-10-CM | POA: Diagnosis not present

## 2019-11-26 LAB — SARS CORONAVIRUS 2 (TAT 6-24 HRS): SARS Coronavirus 2: NEGATIVE

## 2019-11-26 LAB — PREGNANCY, URINE: Preg Test, Ur: NEGATIVE

## 2019-11-27 ENCOUNTER — Ambulatory Visit (HOSPITAL_COMMUNITY)
Admission: RE | Admit: 2019-11-27 | Discharge: 2019-11-27 | Disposition: A | Discharge status: Home / Self Care | Payer: Medicaid Other | Attending: Internal Medicine | Admitting: Internal Medicine

## 2019-11-27 ENCOUNTER — Other Ambulatory Visit: Payer: Self-pay

## 2019-11-27 ENCOUNTER — Encounter (HOSPITAL_COMMUNITY)
Admission: RE | Disposition: A | Discharge status: Home / Self Care | Payer: Self-pay | Source: Home / Self Care | Attending: Internal Medicine

## 2019-11-27 ENCOUNTER — Encounter (HOSPITAL_COMMUNITY): Payer: Self-pay | Admitting: Internal Medicine

## 2019-11-27 ENCOUNTER — Ambulatory Visit (HOSPITAL_COMMUNITY): Payer: Medicaid Other | Admitting: Anesthesiology

## 2019-11-27 DIAGNOSIS — Z1211 Encounter for screening for malignant neoplasm of colon: Secondary | ICD-10-CM | POA: Diagnosis not present

## 2019-11-27 DIAGNOSIS — K640 First degree hemorrhoids: Secondary | ICD-10-CM | POA: Insufficient documentation

## 2019-11-27 DIAGNOSIS — Z79899 Other long term (current) drug therapy: Secondary | ICD-10-CM | POA: Insufficient documentation

## 2019-11-27 DIAGNOSIS — Z793 Long term (current) use of hormonal contraceptives: Secondary | ICD-10-CM | POA: Diagnosis not present

## 2019-11-27 HISTORY — PX: COLONOSCOPY WITH PROPOFOL: SHX5780

## 2019-11-27 SURGERY — COLONOSCOPY WITH PROPOFOL
Anesthesia: General

## 2019-11-27 MED ORDER — CHLORHEXIDINE GLUCONATE CLOTH 2 % EX PADS: 6.0000 | MEDICATED_PAD | Freq: Once | CUTANEOUS | Status: DC

## 2019-11-27 MED ORDER — LACTATED RINGERS IV SOLN: Freq: Once | INTRAVENOUS | Status: AC

## 2019-11-27 MED ORDER — KETAMINE HCL 10 MG/ML IJ SOLN
INTRAMUSCULAR | Status: DC | PRN
  Administered 2019-11-27: 20 mg via INTRAVENOUS

## 2019-11-27 MED ORDER — LIDOCAINE HCL (CARDIAC) PF 100 MG/5ML IV SOSY
PREFILLED_SYRINGE | INTRAVENOUS | Status: DC | PRN
  Administered 2019-11-27: 50 mg via INTRAVENOUS

## 2019-11-27 MED ORDER — LACTATED RINGERS IV SOLN: INTRAVENOUS | Status: DC | PRN

## 2019-11-27 MED ORDER — KETAMINE HCL 50 MG/5ML IJ SOSY
PREFILLED_SYRINGE | INTRAMUSCULAR | Status: AC
  Filled 2019-11-27: qty 5

## 2019-11-27 MED ORDER — STERILE WATER FOR IRRIGATION IR SOLN
Status: DC | PRN
  Administered 2019-11-27: 1.5 mL

## 2019-11-27 MED ORDER — PROPOFOL 500 MG/50ML IV EMUL
INTRAVENOUS | Status: DC | PRN
  Administered 2019-11-27: 150 ug/kg/min via INTRAVENOUS

## 2019-11-27 NOTE — Anesthesia Postprocedure Evaluation (Signed)
Anesthesia Post Note  Patient: Maddalyn D Hourihan  Procedure(s) Performed: COLONOSCOPY WITH PROPOFOL (N/A )  Patient location during evaluation: PACU Anesthesia Type: General Level of consciousness: awake and alert Pain management: pain level controlled Vital Signs Assessment: post-procedure vital signs reviewed and stable Respiratory status: spontaneous breathing, nonlabored ventilation and respiratory function stable Cardiovascular status: stable Postop Assessment: no apparent nausea or vomiting Anesthetic complications: no   No complications documented.   Last Vitals:  Vitals:   11/27/19 0742 11/27/19 0936  BP: (!) 143/87 103/72  Pulse:  92  Resp: 20 14  Temp: 37.5 C 36.8 C  SpO2: 97% 97%    Last Pain:  Vitals:   11/27/19 0936  TempSrc: Oral  PainSc: 0-No pain                 Leonides Minder Hristova

## 2019-11-27 NOTE — H&P (Signed)
@LOGO @   Primary Care Physician:  , MD Primary Gastroenterologist:  Dr. Kerri Perches  Pre-Procedure History & Physical: HPI:  Catherine Allen is a 44 y.o. female is here for a screening colonoscopy.  First ever average risk screening examination   Past Medical History:  Diagnosis Date  . Allergy   . Anxiety    [employer] 07/26/2019  . Dyspareunia   . Hemorrhoids   . HLD (hyperlipidemia)   . HTN (hypertension)   . Hypertension    [facility] 07/26/2019  . MRSA (methicillin resistant Staphylococcus aureus) 2010   boils thighs  . S/P endoscopy June 2012   Dr. July 2012: mild erosive esophagitis, chronic duodenitis, mild gastritis, stop NSAIDs.   . Vaginitis     Past Surgical History:  Procedure Laterality Date  . CESAREAN SECTION  2004  . CESAREAN SECTION N/A    [facility] 07/26/2019  . ESOPHAGOGASTRODUODENOSCOPY  2012   small hiatal hernia and mild erosive reflux esophagitis    Prior to Admission medications   Medication Sig Start Date End Date Taking? Authorizing Provider  acyclovir (ZOVIRAX) 400 MG tablet TAKE 1 TABLET BY MOUTH TWICE DAILY Patient taking differently: Take 400 mg by mouth daily as needed (Flair up).  07/15/19  Yes 09/15/19, MD  amLODipine (NORVASC) 5 MG tablet TAKE 1 TABLET BY MOUTH EVERY DAY Patient taking differently: Take 5 mg by mouth daily.  12/30/18  Yes 01/01/19, MD  benzonatate (TESSALON) 100 MG capsule Take 1 capsule (100 mg total) by mouth 2 (two) times daily as needed for cough. 11/20/19  Yes 13/11/21, MD  busPIRone (BUSPAR) 5 MG tablet TAKE 1 TABLET BY MOUTH TWICE A DAY Patient taking differently: Take 5 mg by mouth 2 (two) times daily.  10/21/19  Yes 12/21/19, MD  desloratadine (CLARINEX) 5 MG tablet TAKE 1 TABLET BY MOUTH EVERY DAY Patient taking differently: Take 5 mg by mouth daily.  11/03/19  Yes 11/05/19, MD  fexofenadine (ALLEGRA) 180 MG tablet Take 180 mg by mouth daily.   Yes  [provider]  levonorgestrel-ethinyl estradiol (SEASONALE) 0.15-0.03 MG tablet Take 1 tablet by mouth daily. 08/28/19  Yes 08/30/19, MD  LORazepam (ATIVAN) 1 MG tablet Take one tablet by mouth once daily , as needed, for anxiety Patient taking differently: Take 1 mg by mouth daily as needed (Panic attacks).  07/28/19  Yes 07/30/19, MD  montelukast (SINGULAIR) 10 MG tablet TAKE 1 TABLET BY MOUTH EVERYDAY AT BEDTIME Patient taking differently: Take 10 mg by mouth at bedtime.  11/20/19  Yes 13/11/21, MD  pantoprazole (PROTONIX) 40 MG tablet Take 1 tablet (40 mg total) by mouth 2 (two) times daily. 12/13/18  Yes Georg Ang, 14/4/20, MD  potassium chloride (KLOR-CON) 10 MEQ tablet TAKE 1 TABLET BY MOUTH TWICE A DAY Patient taking differently: Take 10 mEq by mouth 2 (two) times daily.  09/23/19  Yes 09/25/19, MD  pravastatin (PRAVACHOL) 80 MG tablet Take 1 tablet (80 mg total) by mouth daily. Patient taking differently: Take 80 mg by mouth at bedtime.  07/28/19  Yes 07/30/19, MD  promethazine-dextromethorphan (PROMETHAZINE-DM) 6.25-15 MG/5ML syrup Take 1 teaspoon by mouth 2 times daily as needed for excessive cough. Patient taking differently: Take 5 mLs by mouth 2 (two) times daily as needed for cough.  11/20/19  Yes 13/11/21, MD  triamterene-hydrochlorothiazide (MAXZIDE) 75-50 MG tablet TAKE 1 TABLET BY MOUTH DAILY  06/30/19  Yes Kerri Perches, MD  predniSONE (STERAPRED UNI-PAK 21 TAB) 5 MG (21) TBPK tablet Take 1 tablet (5 mg total) by mouth as directed. Use as directed Patient taking differently: Take 5 mg by mouth daily. taper 11/20/19   Kerri Perches, MD  triamcinolone cream (KENALOG) 0.1 % Apply 1 application topically 2 (two) times daily. Patient taking differently: Apply 1 application topically daily as needed (bug bites).  10/09/19   Kerri Perches, MD    Allergies as of 10/13/2019  . (No Known Allergies)     Family History  Problem Relation Age of Onset  . Hypertension Mother   . Asthma Sister   . Colon cancer Neg Hx     Social History   Socioeconomic History  . Marital status: Single    Spouse name: Not on file  . Number of children: 2  . Years of education: Not on file  . Highest education level: Not on file  Occupational History  . Occupation: homemaker  Tobacco Use  . Smoking status: Never Smoker  . Smokeless tobacco: Never Used  Substance and Sexual Activity  . Alcohol use: No  . Drug use: No  . Sexual activity: Yes    Partners: Male    Birth control/protection: Pill  Other Topics Concern  . Not on file  Social History Narrative   LIves w/ 2 sons 8 &16   Social Determinants of Health   Financial Resource Strain:   . Difficulty of Paying Living Expenses: Not on file  Food Insecurity:   . Worried About Programme researcher, broadcasting/film/video in the Last Year: Not on file  . Ran Out of Food in the Last Year: Not on file  Transportation Needs:   . Lack of Transportation (Medical): Not on file  . Lack of Transportation (Non-Medical): Not on file  Physical Activity:   . Days of Exercise per Week: Not on file  . Minutes of Exercise per Session: Not on file  Stress:   . Feeling of Stress : Not on file  Social Connections:   . Frequency of Communication with Friends and Family: Not on file  . Frequency of Social Gatherings with Friends and Family: Not on file  . Attends Religious Services: Not on file  . Active Member of Clubs or Organizations: Not on file  . Attends Banker Meetings: Not on file  . Marital Status: Not on file  Intimate Partner Violence:   . Fear of Current or Ex-Partner: Not on file  . Emotionally Abused: Not on file  . Physically Abused: Not on file  . Sexually Abused: Not on file    Review of Systems: See HPI, otherwise negative ROS  Physical Exam: BP (!) 143/87   Temp 99.5 F (37.5 C) (Oral)   Resp 20   Ht 5\' 2"  (1.575 m)   Wt 78 kg    SpO2 97%   BMI 31.46 kg/m  General:   Alert,  Well-developed, well-nourished, pleasant and cooperative in NAD Lungs:  Clear throughout to auscultation.   No wheezes, crackles, or rhonchi. No acute distress. Heart:  Regular rate and rhythm; no murmurs, clicks, rubs,  or gallops. Abdomen:  Soft, nontender and nondistended. No masses, hepatosplenomegaly or hernias noted. Normal bowel sounds, without guarding, and without rebound.     Impression/Plan: Catherine Allen is now here to undergo a screening colonoscopy.  First ever average risk screening examination  Risks, benefits, limitations, imponderables and alternatives regarding colonoscopy have  been reviewed with the patient. Questions have been answered. All parties agreeable.     Notice:  This dictation was prepared with Dragon dictation along with smaller phrase technology. Any transcriptional errors that result from this process are unintentional and may not be corrected upon review.

## 2019-11-27 NOTE — Anesthesia Preprocedure Evaluation (Signed)
Anesthesia Evaluation  Patient identified by MRN, date of birth, ID band Patient awake    Reviewed: Allergy & Precautions, NPO status , Patient's Chart, lab work & pertinent test results  History of Anesthesia Complications Negative for: history of anesthetic complications  Airway Mallampati: II  TM Distance: >3 FB Neck ROM: Full    Dental  (+) Dental Advisory Given, Teeth Intact   Pulmonary  Upper respiratory infection, lost her voice, took prednisone, resolved   Pulmonary exam normal breath sounds clear to auscultation       Cardiovascular Exercise Tolerance: Good hypertension, Pt. on medications Normal cardiovascular exam Rhythm:Regular Rate:Normal     Neuro/Psych PSYCHIATRIC DISORDERS Anxiety negative neurological ROS     GI/Hepatic Neg liver ROS, GERD  Medicated and Controlled,  Endo/Other  negative endocrine ROS  Renal/GU negative Renal ROS     Musculoskeletal negative musculoskeletal ROS (+)   Abdominal   Peds  Hematology negative hematology ROS (+)   Anesthesia Other Findings   Reproductive/Obstetrics negative OB ROS                             Anesthesia Physical Anesthesia Plan  ASA: II  Anesthesia Plan: General   Post-op Pain Management:    Induction:   PONV Risk Score and Plan: TIVA  Airway Management Planned: Nasal Cannula and Natural Airway  Additional Equipment:   Intra-op Plan:   Post-operative Plan:   Informed Consent: I have reviewed the patients History and Physical, chart, labs and discussed the procedure including the risks, benefits and alternatives for the proposed anesthesia with the patient or authorized representative who has indicated his/her understanding and acceptance.     Dental advisory given  Plan Discussed with: CRNA and Surgeon  Anesthesia Plan Comments:         Anesthesia Quick Evaluation

## 2019-11-27 NOTE — Transfer of Care (Signed)
Immediate Anesthesia Transfer of Care Note  Patient: Catherine Allen  Procedure(s) Performed: COLONOSCOPY WITH PROPOFOL (N/A )  Patient Location: PACU  Anesthesia Type:General  Level of Consciousness: awake  Airway & Oxygen Therapy: Patient Spontanous Breathing  Post-op Assessment: Report given to RN and Post -op Vital signs reviewed and stable  Post vital signs: Reviewed and stable  Last Vitals:  Vitals Value Taken Time  BP 103/72 11/27/19 0936  Temp 36.8 C 11/27/19 0936  Pulse 92 11/27/19 0936  Resp 14 11/27/19 0936  SpO2 97 % 11/27/19 0936    Last Pain:  Vitals:   11/27/19 0936  TempSrc: Oral  PainSc: 0-No pain      Patients Stated Pain Goal: 6 (11/27/19 0742)  Complications: No complications documented.

## 2019-11-27 NOTE — Discharge Instructions (Signed)
Colonoscopy Discharge Instructions  Read the instructions outlined below and refer to this sheet in the next few weeks. These discharge instructions provide you with general information on caring for yourself after you leave the hospital. Your doctor may also give you specific instructions. While your treatment has been planned according to the most current medical practices available, unavoidable complications occasionally occur. If you have any problems or questions after discharge, call Dr. Jena Gauss at [phone]. ACTIVITY  You may resume your regular activity, but move at a slower pace for the next 24 hours.   Take frequent rest periods for the next 24 hours.   Walking will help get rid of the air and reduce the bloated feeling in your belly (abdomen).   No driving for 24 hours (because of the medicine (anesthesia) used during the test).    Do not sign any important legal documents or operate any machinery for 24 hours (because of the anesthesia used during the test).  NUTRITION  Drink plenty of fluids.   You may resume your normal diet as instructed by your doctor.   Begin with a light meal and progress to your normal diet. Heavy or fried foods are harder to digest and may make you feel sick to your stomach (nauseated).   Avoid alcoholic beverages for 24 hours or as instructed.  MEDICATIONS  You may resume your normal medications unless your doctor tells you otherwise.  WHAT YOU CAN EXPECT TODAY  Some feelings of bloating in the abdomen.   Passage of more gas than usual.   Spotting of blood in your stool or on the toilet paper.  IF YOU HAD POLYPS REMOVED DURING THE COLONOSCOPY:  No aspirin products for 7 days or as instructed.   No alcohol for 7 days or as instructed.   Eat a soft diet for the next 24 hours.  FINDING OUT THE RESULTS OF YOUR TEST Not all test results are available during your visit. If your test results are not back during the visit, make an appointment  with your caregiver to find out the results. Do not assume everything is normal if you have not heard from your caregiver or the medical facility. It is important for you to follow up on all of your test results.  SEEK IMMEDIATE MEDICAL ATTENTION IF:  You have more than a spotting of blood in your stool.   Your belly is swollen (abdominal distention).   You are nauseated or vomiting.   You have a temperature over 101.   You have abdominal pain or discomfort that is severe or gets worse throughout the day.   Your colon looked good today.  A repeat colonoscopy in 10 years recommended for screening purposes  At patient request, I called Benita Avis at [phone] findings and recommendations     Hemorrhoids Hemorrhoids are swollen veins in and around the rectum or anus. There are two types of hemorrhoids:  Internal hemorrhoids. These occur in the veins that are just inside the rectum. They may poke through to the outside and become irritated and painful.  External hemorrhoids. These occur in the veins that are outside the anus and can be felt as a painful swelling or hard lump near the anus. Most hemorrhoids do not cause serious problems, and they can be managed with home treatments such as diet and lifestyle changes. If home treatments do not help the symptoms, procedures can be done to shrink or remove the hemorrhoids. What are the causes? This condition is caused  by increased pressure in the anal area. This pressure may result from various things, including:  Constipation.  Straining to have a bowel movement.  Diarrhea.  Pregnancy.  Obesity.  Sitting for long periods of time.  Heavy lifting or other activity that causes you to strain.  Anal sex.  Riding a bike for a long period of time. What are the signs or symptoms? Symptoms of this condition include:  Pain.  Anal itching or irritation.  Rectal bleeding.  Leakage of stool (feces).  Anal  swelling.  One or more lumps around the anus. How is this diagnosed? This condition can often be diagnosed through a visual exam. Other exams or tests may also be done, such as:  An exam that involves feeling the rectal area with a gloved hand (digital rectal exam).  An exam of the anal canal that is done using a small tube (anoscope).  A blood test, if you have lost a significant amount of blood.  A test to look inside the colon using a flexible tube with a camera on the end (sigmoidoscopy or colonoscopy). How is this treated? This condition can usually be treated at home. However, various procedures may be done if dietary changes, lifestyle changes, and other home treatments do not help your symptoms. These procedures can help make the hemorrhoids smaller or remove them completely. Some of these procedures involve surgery, and others do not. Common procedures include:  Rubber band ligation. Rubber bands are placed at the base of the hemorrhoids to cut off their blood supply.  Sclerotherapy. Medicine is injected into the hemorrhoids to shrink them.  Infrared coagulation. A type of light energy is used to get rid of the hemorrhoids.  Hemorrhoidectomy surgery. The hemorrhoids are surgically removed, and the veins that supply them are tied off.  Stapled hemorrhoidopexy surgery. The surgeon staples the base of the hemorrhoid to the rectal wall. Follow these instructions at home: Eating and drinking   Eat foods that have a lot of fiber in them, such as whole grains, beans, nuts, fruits, and vegetables.  Ask your health care provider about taking products that have added fiber (fiber supplements).  Reduce the amount of fat in your diet. You can do this by eating low-fat dairy products, eating less red meat, and avoiding processed foods.  Drink enough fluid to keep your urine pale yellow. Managing pain and swelling   Take warm sitz baths for 20 minutes, 3-4 times a day to ease pain  and discomfort. You may do this in a bathtub or using a portable sitz bath that fits over the toilet.  If directed, apply ice to the affected area. Using ice packs between sitz baths may be helpful. ? Put ice in a plastic bag. ? Place a towel between your skin and the bag. ? Leave the ice on for 20 minutes, 2-3 times a day. General instructions  Take over-the-counter and prescription medicines only as told by your health care provider.  Use medicated creams or suppositories as told.  Get regular exercise. Ask your health care provider how much and what kind of exercise is best for you. In general, you should do moderate exercise for at least 30 minutes on most days of the week (150 minutes each week). This can include activities such as walking, biking, or yoga.  Go to the bathroom when you have the urge to have a bowel movement. Do not wait.  Avoid straining to have bowel movements.  Keep the anal area dry  and clean. Use wet toilet paper or moist towelettes after a bowel movement.  Do not sit on the toilet for long periods of time. This increases blood pooling and pain.  Keep all follow-up visits as told by your health care provider. This is important. Contact a health care provider if you have:  Increasing pain and swelling that are not controlled by treatment or medicine.  Difficulty having a bowel movement, or you are unable to have a bowel movement.  Pain or inflammation outside the area of the hemorrhoids. Get help right away if you have:  Uncontrolled bleeding from your rectum. Summary  Hemorrhoids are swollen veins in and around the rectum or anus.  Most hemorrhoids can be managed with home treatments such as diet and lifestyle changes.  Taking warm sitz baths can help ease pain and discomfort.  In severe cases, procedures or surgery can be done to shrink or remove the hemorrhoids. This information is not intended to replace advice given to you by your health care  provider. Make sure you discuss any questions you have with your health care provider. Document Revised: 05/24/2018 Document Reviewed: 05/17/2017 Elsevier Patient Education  2020 [employer]    Monitored Anesthesia Care, Care After These instructions provide you with information about caring for yourself after your procedure. Your health care provider may also give you more specific instructions. Your treatment has been planned according to current medical practices, but problems sometimes occur. Call your health care provider if you have any problems or questions after your procedure. What can I expect after the procedure? After your procedure, you may:  Feel sleepy for several hours.  Feel clumsy and have poor balance for several hours.  Feel forgetful about what happened after the procedure.  Have poor judgment for several hours.  Feel nauseous or vomit.  Have a sore throat if you had a breathing tube during the procedure. Follow these instructions at home: For at least 24 hours after the procedure:      Have a responsible adult stay with you. It is important to have someone help care for you until you are awake and alert.  Rest as needed.  Do not: ? Participate in activities in which you could fall or become injured. ? Drive. ? Use heavy machinery. ? Drink alcohol. ? Take sleeping pills or medicines that cause drowsiness. ? Make important decisions or sign legal documents. ? Take care of children on your own. Eating and drinking  Follow the diet that is recommended by your health care provider.  If you vomit, drink water, juice, or soup when you can drink without vomiting.  Make sure you have little or no nausea before eating solid foods. General instructions  Take over-the-counter and prescription medicines only as told by your health care provider.  If you have sleep apnea, surgery and certain medicines can increase your risk for breathing problems. Follow  instructions from your health care provider about wearing your sleep device: ? Anytime you are sleeping, including during daytime naps. ? While taking prescription pain medicines, sleeping medicines, or medicines that make you drowsy.  If you smoke, do not smoke without supervision.  Keep all follow-up visits as told by your health care provider. This is important. Contact a health care provider if:  You keep feeling nauseous or you keep vomiting.  You feel light-headed.  You develop a rash.  You have a fever. Get help right away if:  You have trouble breathing. Summary  For several  hours after your procedure, you may feel sleepy and have poor judgment.  Have a responsible adult stay with you for at least 24 hours or until you are awake and alert. This information is not intended to replace advice given to you by your health care provider. Make sure you discuss any questions you have with your health care provider. Document Revised: 03/26/2017 Document Reviewed: 04/18/2015 Elsevier Patient Education  2020 [employer]

## 2019-11-27 NOTE — Op Note (Signed)
[facility] Patient Name: Catherine Allen Procedure Date: 11/27/2019 8:49 AM MRN: [id number] Date of Birth: 1975/07/19 Attending MD: Gennette Pac , MD CSN: [id number] Age: 44 Admit Type: Outpatient Procedure:                Colonoscopy Indications:              Screening for colorectal malignant neoplasm Providers:                Gennette Pac, MD, Angelica Ran, Kristine L.                            Jessee Avers, Technician, Pandora Leiter, Technician Referring MD:              Medicines:                Propofol per Anesthesia Complications:            No immediate complications. Estimated Blood Loss:     Estimated blood loss: none. Procedure:                Pre-Anesthesia Assessment:                           - Prior to the procedure, a History and Physical                            was performed, and patient medications and                            allergies were reviewed. The patient's tolerance of                            previous anesthesia was also reviewed. The risks                            and benefits of the procedure and the sedation                            options and risks were discussed with the patient.                            All questions were answered, and informed consent                            was obtained. Prior Anticoagulants: The patient has                            taken no previous anticoagulant or antiplatelet                            agents. ASA Grade Assessment: II - A patient with                            mild systemic disease. After reviewing the risks  and benefits, the patient was deemed in                            satisfactory condition to undergo the procedure.                           After obtaining informed consent, the colonoscope                            was passed under direct vision. Throughout the                            procedure, the patient's blood pressure, pulse, and                             oxygen saturations were monitored continuously. The                            [id number]-[id number] ([id number]) scope was introduced through                            the anus and advanced to the the cecum, identified                            by appendiceal orifice and ileocecal valve. The                            colonoscopy was performed without difficulty. The                            patient tolerated the procedure well. The quality                            of the bowel preparation was adequate. The                            ileocecal valve, appendiceal orifice, and rectum                            were photographed. The entire colon was well                            visualized. Scope In: 9:12:37 AM Scope Out: 9:31:24 AM Scope Withdrawal Time: 0 hours 9 minutes 50 seconds  Total Procedure Duration: 0 hours 18 minutes 47 seconds  Findings:      The perianal and digital rectal examinations were normal. Moderate       external abdominal pressure required to reach the cecum.      Non-bleeding internal hemorrhoids were found during retroflexion. The       hemorrhoids were mild, small and Grade I (internal hemorrhoids that do       not prolapse).      The exam was otherwise without abnormality on direct and retroflexion       views. Impression:               -  Non-bleeding internal hemorrhoids.                           - The examination was otherwise normal on direct                            and retroflexion views. Mildly redundant colon.                           - No specimens collected. Moderate Sedation:      Moderate (conscious) sedation was personally administered by an       anesthesia professional. The following parameters were monitored: oxygen       saturation, heart rate, blood pressure, respiratory rate, EKG, adequacy       of pulmonary ventilation, and response to care. Recommendation:           - Patient has a contact number available for                             emergencies. The signs and symptoms of potential                            delayed complications were discussed with the                            patient. Return to normal activities tomorrow.                            Written discharge instructions were provided to the                            patient.                           - Resume previous diet.                           - Continue present medications.                           - Repeat colonoscopy in 10 years for screening                            purposes.                           - Return to GI office (date not yet determined). Procedure Code(s):        --- Professional ---                           [phone], Colonoscopy, flexible; diagnostic, including                            collection of specimen(s) by brushing or washing,  when performed (separate procedure) Diagnosis Code(s):        --- Professional ---                           Z12.11, Encounter for screening for malignant                            neoplasm of colon                           K64.0, First degree hemorrhoids CPT copyright 2019 [employer]. All rights reserved. The codes documented in this report are preliminary and upon coder review may  be revised to meet current compliance requirements. Gerrit Friends. Darcus Edds, MD Gennette Pac, MD 11/27/2019 9:40:02 AM This report has been signed electronically. Number of Addenda: 0

## 2019-11-28 ENCOUNTER — Ambulatory Visit (INDEPENDENT_AMBULATORY_CARE_PROVIDER_SITE_OTHER): Payer: Medicaid Other

## 2019-11-28 DIAGNOSIS — Z23 Encounter for immunization: Secondary | ICD-10-CM | POA: Diagnosis not present

## 2019-12-02 ENCOUNTER — Encounter (HOSPITAL_COMMUNITY): Payer: Self-pay | Admitting: Internal Medicine

## 2019-12-03 ENCOUNTER — Other Ambulatory Visit: Payer: Self-pay | Admitting: Family Medicine

## 2019-12-03 DIAGNOSIS — J3089 Other allergic rhinitis: Secondary | ICD-10-CM

## 2019-12-20 ENCOUNTER — Other Ambulatory Visit: Payer: Self-pay | Admitting: Family Medicine

## 2019-12-20 DIAGNOSIS — J3089 Other allergic rhinitis: Secondary | ICD-10-CM

## 2019-12-23 ENCOUNTER — Other Ambulatory Visit: Payer: Self-pay | Admitting: Family Medicine

## 2019-12-25 DIAGNOSIS — E785 Hyperlipidemia, unspecified: Secondary | ICD-10-CM | POA: Diagnosis not present

## 2019-12-25 DIAGNOSIS — I10 Essential (primary) hypertension: Secondary | ICD-10-CM | POA: Diagnosis not present

## 2019-12-26 ENCOUNTER — Ambulatory Visit
Admission: RE | Admit: 2019-12-26 | Discharge: 2019-12-26 | Disposition: A | Discharge status: Home / Self Care | Payer: Medicaid Other | Source: Ambulatory Visit | Attending: Family Medicine | Admitting: Family Medicine

## 2019-12-26 ENCOUNTER — Other Ambulatory Visit: Payer: Self-pay

## 2019-12-26 DIAGNOSIS — Z1231 Encounter for screening mammogram for malignant neoplasm of breast: Secondary | ICD-10-CM

## 2019-12-26 LAB — CMP14+EGFR
ALT: 15 IU/L (ref 0–32)
AST: 19 IU/L (ref 0–40)
Albumin/Globulin Ratio: 1.6 (ref 1.2–2.2)
Albumin: 4.1 g/dL (ref 3.8–4.8)
Alkaline Phosphatase: 67 IU/L (ref 44–121)
BUN/Creatinine Ratio: 8 — ABNORMAL LOW (ref 9–23)
BUN: 8 mg/dL (ref 6–24)
Bilirubin Total: 0.5 mg/dL (ref 0.0–1.2)
CO2: 21 mmol/L (ref 20–29)
Calcium: 9.2 mg/dL (ref 8.7–10.2)
Chloride: 103 mmol/L (ref 96–106)
Creatinine, Ser: 1.03 mg/dL — ABNORMAL HIGH (ref 0.57–1.00)
GFR calc Af Amer: 76 mL/min/{1.73_m2} (ref >59)
GFR calc non Af Amer: 66 mL/min/{1.73_m2} (ref >59)
Globulin, Total: 2.6 g/dL (ref 1.5–4.5)
Glucose: 91 mg/dL (ref 65–99)
Potassium: 3.7 mmol/L (ref 3.5–5.2)
Sodium: 139 mmol/L (ref 134–144)
Total Protein: 6.7 g/dL (ref 6.0–8.5)

## 2019-12-26 LAB — LIPID PANEL
Chol/HDL Ratio: 4.4 ratio (ref 0.0–4.4)
Cholesterol, Total: 201 mg/dL — ABNORMAL HIGH (ref 100–199)
HDL: 46 mg/dL (ref >39)
LDL Chol Calc (NIH): 139 mg/dL — ABNORMAL HIGH (ref 0–99)
Triglycerides: 90 mg/dL (ref 0–149)
VLDL Cholesterol Cal: 16 mg/dL (ref 5–40)

## 2019-12-28 ENCOUNTER — Other Ambulatory Visit: Payer: Self-pay | Admitting: Internal Medicine

## 2019-12-28 ENCOUNTER — Other Ambulatory Visit: Payer: Self-pay | Admitting: Family Medicine

## 2019-12-30 ENCOUNTER — Other Ambulatory Visit: Payer: Self-pay

## 2019-12-30 ENCOUNTER — Encounter: Payer: Self-pay | Admitting: Family Medicine

## 2019-12-30 ENCOUNTER — Ambulatory Visit (INDEPENDENT_AMBULATORY_CARE_PROVIDER_SITE_OTHER): Payer: Medicaid Other | Admitting: Family Medicine

## 2019-12-30 VITALS — BP 145/85 | HR 86 | Resp 15 | Ht 62.0 in | Wt 171.4 lb

## 2019-12-30 DIAGNOSIS — F411 Generalized anxiety disorder: Secondary | ICD-10-CM

## 2019-12-30 DIAGNOSIS — I10 Essential (primary) hypertension: Secondary | ICD-10-CM

## 2019-12-30 DIAGNOSIS — E663 Overweight: Secondary | ICD-10-CM | POA: Diagnosis not present

## 2019-12-30 DIAGNOSIS — E782 Mixed hyperlipidemia: Secondary | ICD-10-CM | POA: Diagnosis not present

## 2019-12-30 DIAGNOSIS — K219 Gastro-esophageal reflux disease without esophagitis: Secondary | ICD-10-CM | POA: Diagnosis not present

## 2019-12-30 DIAGNOSIS — F410 Panic disorder [episodic paroxysmal anxiety] without agoraphobia: Secondary | ICD-10-CM

## 2019-12-30 MED ORDER — ROSUVASTATIN CALCIUM 20 MG PO TABS: 20.0000 mg | ORAL_TABLET | Freq: Every day | ORAL | 3 refills | Status: DC

## 2019-12-30 MED ORDER — AMLODIPINE BESYLATE 10 MG PO TABS: 10.0000 mg | ORAL_TABLET | Freq: Every day | ORAL | 3 refills | Status: DC

## 2019-12-30 NOTE — Patient Instructions (Signed)
F/u in 8 to 10 weeks, re evaluate blood pressure , call if you need me sooner  Higher dose of amlodipine to start today, 10 mg one daily, stop amlodipine 5 mg tablet please  For cholesterol, increase vegetable and fruit , reduce cheese and fast food.  New is rosuvastatin 20 mg , stop pravastatin 80 mg tablet please.  It is important that you exercise regularly at least 30 minutes 5 times a week. If you develop chest pain, have severe difficulty breathing, or feel very tired, stop exercising immediately and seek medical attention     It is important that you exercise regularly at least 30 minutes 5 times a week. If you develop chest pain, have severe difficulty breathing, or feel very tired, stop exercising immediately and seek medical attention   Think about what you will eat, plan ahead. Choose " clean, green, fresh or frozen" over canned, processed or packaged foods which are more sugary, salty and fatty. 70 to 75% of food eaten should be vegetables and fruit. Three meals at set times with snacks allowed between meals, but they must be fruit or vegetables. Aim to eat over a 12 hour period , example 7 am to 7 pm, and STOP after  your last meal of the day. Drink water,generally about 64 ounces per day, no other drink is as healthy. Fruit juice is best enjoyed in a healthy way, by EATING the fruit.  Hopr bug situation is cleared up so you have no ore issues  Thanks for choosing [facility], we consider it a privelige to serve you.

## 2020-01-05 ENCOUNTER — Encounter: Payer: Self-pay | Admitting: Family Medicine

## 2020-01-05 NOTE — Assessment & Plan Note (Signed)
Controlled, no change in medication  

## 2020-01-05 NOTE — Assessment & Plan Note (Signed)
unontrolle inc amlodipine to 10 mg daily DASH diet and commitment to daily physical activity for a minimum of 30 minutes discussed and encouraged, as a part of hypertension management. The importance of attaining a healthy weight is also discussed.  BP/Weight 12/30/2019 11/27/2019 10/14/2019 10/09/2019 10/07/2019 07/28/2019 04/15/2019  Systolic BP 145 103 136 128 138 126 131  Diastolic BP 85 72 82 83 84 84 71  Wt. (Lbs) 171.4 172 171 170 171 174.8 170  BMI 31.35 31.46 31.28 31.09 31.28 30.96 31.09

## 2020-01-05 NOTE — Progress Notes (Signed)
Catherine Allen     MRN: [id number]      DOB: 09-18-1975   HPI Catherine Allen is here for follow up and re-evaluation of chronic medical conditions, medication management and review of any available recent lab and radiology data.  Preventive health is updated, specifically  Cancer screening and Immunization.   Questions or concerns regarding consultations or procedures which the PT has had in the interim are  addressed. The PT denies any adverse reactions to current medications since the last visit.  There are no new concerns.  There are no specific complaints   ROS Denies recent fever or chills. Denies sinus pressure, nasal congestion, ear pain or sore throat. Denies chest congestion, productive cough or wheezing. Denies chest pains, palpitations and leg swelling Denies abdominal pain, nausea, vomiting,diarrhea or constipation.   Denies dysuria, frequency, hesitancy or incontinence. Denies joint pain, swelling and limitation in mobility. Denies headaches, seizures, numbness, or tingling. Denies depression, anxiety or insomnia. Denies skin break down or rash.   PE  BP (!) 145/85   Pulse 86   Resp 15   Ht 5\' 2"  (1.575 m)   Wt 171 lb 6.4 oz (77.7 kg)   SpO2 98%   BMI 31.35 kg/m   Patient alert and oriented and in no cardiopulmonary distress.  HEENT: No facial asymmetry, EOMI,     Neck supple .  Chest: Clear to auscultation bilaterally.  CVS: S1, S2 no murmurs, no S3.Regular rate.  ABD: Soft non tender.   Ext: No edema  MS: Adequate ROM spine, shoulders, hips and knees.  Skin: Intact, no ulcerations or rash noted.  Psych: Good eye contact, normal affect. Memory intact not anxious or depressed appearing.  CNS: CN 2-12 intact, power,  normal throughout.no focal deficits noted.   Assessment & Plan  Essential hypertension unontrolle inc amlodipine to 10 mg daily DASH diet and commitment to daily physical activity for a minimum of 30 minutes discussed and  encouraged, as a part of hypertension management. The importance of attaining a healthy weight is also discussed.  BP/Weight 12/30/2019 11/27/2019 10/14/2019 10/09/2019 10/07/2019 07/28/2019 04/15/2019  Systolic BP 145 103 136 128 138 126 131  Diastolic BP 85 72 82 83 84 84 71  Wt. (Lbs) 171.4 172 171 170 171 174.8 170  BMI 31.35 31.46 31.28 31.09 31.28 30.96 31.09       Hyperlipemia uncorected , change med and lower fat intake Hyperlipidemia:Low fat diet discussed and encouraged.   Lipid Panel  Lab Results  Component Value Date   CHOL 201 (H) 12/25/2019   HDL 46 12/25/2019   LDLCALC 139 (H) 12/25/2019   TRIG 90 12/25/2019   CHOLHDL 4.4 12/25/2019       Overweight (BMI 25.0-29.9)  Patient re-educated about  the importance of commitment to a  minimum of 150 minutes of exercise per week as able.  The importance of healthy food choices with portion control discussed, as well as eating regularly and within a 12 hour window most days. The need to choose "clean , green" food 50 to 75% of the time is discussed, as well as to make water the primary drink and set a goal of 64 ounces water daily.    Weight /BMI 12/30/2019 11/27/2019 10/14/2019  WEIGHT 171 lb 6.4 oz 172 lb 171 lb  HEIGHT 5\' 2"  5\' 2"  5\' 2"   BMI 31.35 kg/m2 31.46 kg/m2 31.28 kg/m2      GAD (generalized anxiety disorder) Controlled, no change in medication  GERD (gastroesophageal reflux disease) Controlled, no change in medication   Panic attacks Controlled, no change in medication

## 2020-01-05 NOTE — Assessment & Plan Note (Signed)
uncorected , change med and lower fat intake Hyperlipidemia:Low fat diet discussed and encouraged.   Lipid Panel  Lab Results  Component Value Date   CHOL 201 (H) 12/25/2019   HDL 46 12/25/2019   LDLCALC 139 (H) 12/25/2019   TRIG 90 12/25/2019   CHOLHDL 4.4 12/25/2019

## 2020-01-05 NOTE — Assessment & Plan Note (Signed)
  Patient re-educated about  the importance of commitment to a  minimum of 150 minutes of exercise per week as able.  The importance of healthy food choices with portion control discussed, as well as eating regularly and within a 12 hour window most days. The need to choose "clean , green" food 50 to 75% of the time is discussed, as well as to make water the primary drink and set a goal of 64 ounces water daily.    Weight /BMI 12/30/2019 11/27/2019 10/14/2019  WEIGHT 171 lb 6.4 oz 172 lb 171 lb  HEIGHT 5\' 2"  5\' 2"  5\' 2"   BMI 31.35 kg/m2 31.46 kg/m2 31.28 kg/m2

## 2020-01-23 ENCOUNTER — Other Ambulatory Visit: Payer: Self-pay | Admitting: Family Medicine

## 2020-01-28 ENCOUNTER — Other Ambulatory Visit: Payer: Self-pay

## 2020-01-28 ENCOUNTER — Telehealth (INDEPENDENT_AMBULATORY_CARE_PROVIDER_SITE_OTHER): Payer: Medicaid Other | Admitting: Internal Medicine

## 2020-01-28 ENCOUNTER — Other Ambulatory Visit: Payer: Self-pay | Admitting: Family Medicine

## 2020-01-28 ENCOUNTER — Encounter: Payer: Self-pay | Admitting: Family Medicine

## 2020-01-28 ENCOUNTER — Encounter: Payer: Self-pay | Admitting: Internal Medicine

## 2020-01-28 DIAGNOSIS — J3089 Other allergic rhinitis: Secondary | ICD-10-CM

## 2020-01-28 DIAGNOSIS — J209 Acute bronchitis, unspecified: Secondary | ICD-10-CM

## 2020-01-28 MED ORDER — PREDNISONE 10 MG (21) PO TBPK: ORAL_TABLET | ORAL | 0 refills | Status: DC

## 2020-01-28 MED ORDER — BENZONATATE 100 MG PO CAPS: 100.0000 mg | ORAL_CAPSULE | Freq: Two times a day (BID) | ORAL | 0 refills | Status: DC | PRN

## 2020-01-28 NOTE — Patient Instructions (Signed)
Please start taking Prednisone as prescribed. Please take Benzonatate for cough.  Please do warm water/salt water gargles to help with sore throat.  Please increase water intake up to 64 oz in a day.  You are advised to get tested for COVID.  Please get immediate medical attention if you have shortness of breath or chest pain with palpitations.

## 2020-01-28 NOTE — Progress Notes (Signed)
Virtual Visit via Telephone Note   This visit type was conducted due to national recommendations for restrictions regarding the COVID-19 Pandemic (e.g. social distancing) in an effort to limit this patient's exposure and mitigate transmission in our community.  Due to her co-morbid illnesses, this patient is at least at moderate risk for complications without adequate follow up.  This format is felt to be most appropriate for this patient at this time.  The patient did not have access to video technology/had technical difficulties with video requiring transitioning to audio format only (telephone).  All issues noted in this document were discussed and addressed.  No physical exam could be performed with this format.  Evaluation Performed:  Follow-up visit  Date:  01/28/2020   ID:  Catherine Allen, DOB August 12, 1975, MRN [id number]  Patient Location: Home Provider Location: Office/Clinic  Location of Patient: Home Location of Provider: Telehealth Consent was obtain for visit to be over via telehealth. I verified that I am speaking with the correct person using two identifiers.  PCP:  Kerri Perches, MD   Chief Complaint:  Hoarse voice and chest congestion  History of Present Illness:    Catherine Allen is a 45 y.o. female with PMH of allergic rhinitis, GERD, anxiety and HTN who has a televisit for c/o chest congestion and sore throat.  She started having symptoms about 2 days ago, and has been having worse sore throat. She states that her symptoms are similar to the episode of bronchitis in 11/2019. She denies any fever, chills, dyspnea, chest pain or palpitations. Denies any sick contacts.  The patient does have symptoms concerning for COVID-19 infection (fever, chills, cough, or new shortness of breath).   Past Medical, Surgical, Social History, Allergies, and Medications have been Reviewed.  Past Medical History:  Diagnosis Date  . Allergy   . Anxiety    [employer]  07/26/2019  . Dyspareunia   . Hemorrhoids   . HLD (hyperlipidemia)   . HTN (hypertension)   . Hypertension    [employer] 07/26/2019  . MRSA (methicillin resistant Staphylococcus aureus) 2010   boils thighs  . S/P endoscopy June 2012   Dr. Jena Gauss: mild erosive esophagitis, chronic duodenitis, mild gastritis, stop NSAIDs.   . Vaginitis    Past Surgical History:  Procedure Laterality Date  . CESAREAN SECTION  2004  . CESAREAN SECTION N/A    [employer] 07/26/2019  . COLONOSCOPY WITH PROPOFOL N/A 11/27/2019   Procedure: COLONOSCOPY WITH PROPOFOL;  Surgeon: Corbin Ade, MD;  Location: [facility];  Service: Endoscopy;  Laterality: N/A;  9:00am  . ESOPHAGOGASTRODUODENOSCOPY  2012   small hiatal hernia and mild erosive reflux esophagitis     Current Meds  Medication Sig  . acyclovir (ZOVIRAX) 400 MG tablet TAKE 1 TABLET BY MOUTH TWICE DAILY (Patient taking differently: Take 400 mg by mouth daily as needed (Flair up).)  . amLODipine (NORVASC) 10 MG tablet Take 1 tablet (10 mg total) by mouth daily.  . benzonatate (TESSALON) 100 MG capsule Take 1 capsule (100 mg total) by mouth 2 (two) times daily as needed for cough.  . busPIRone (BUSPAR) 5 MG tablet TAKE 1 TABLET BY MOUTH TWICE A DAY  . fexofenadine (ALLEGRA) 180 MG tablet Take 180 mg by mouth daily.  Marland Kitchen levonorgestrel-ethinyl estradiol (SEASONALE) 0.15-0.03 MG tablet Take 1 tablet by mouth daily.  Marland Kitchen LORazepam (ATIVAN) 1 MG tablet Take one tablet by mouth once daily , as needed, for anxiety (Patient taking differently:  Take 1 mg by mouth daily as needed (Panic attacks).)  . montelukast (SINGULAIR) 10 MG tablet TAKE 1 TABLET BY MOUTH EVERYDAY AT BEDTIME (Patient taking differently: Take 10 mg by mouth at bedtime.)  . pantoprazole (PROTONIX) 40 MG tablet TAKE 1 TABLET BY MOUTH TWICE A DAY  . potassium chloride (KLOR-CON) 10 MEQ tablet TAKE 1 TABLET BY MOUTH TWICE A DAY  . predniSONE (STERAPRED UNI-PAK 21 TAB) 10 MG (21) TBPK tablet Takes as  package instructions.  . rosuvastatin (CRESTOR) 20 MG tablet Take 1 tablet (20 mg total) by mouth daily.  Marland Kitchen triamcinolone cream (KENALOG) 0.1 % Apply 1 application topically 2 (two) times daily. (Patient taking differently: Apply 1 application topically daily as needed (bug bites).)  . triamterene-hydrochlorothiazide (MAXZIDE) 75-50 MG tablet TAKE 1 TABLET BY MOUTH DAILY     Allergies:   Patient has no known allergies.   ROS:   Please see the history of present illness.     All other systems reviewed and are negative.   Labs/Other Tests and Data Reviewed:    Recent Labs: 07/23/2019: Hemoglobin 13.0; Platelets 357; TSH 1.99 12/25/2019: ALT 15; BUN 8; Creatinine, Ser 1.03; Potassium 3.7; Sodium 139   Recent Lipid Panel Lab Results  Component Value Date/Time   CHOL 201 (H) 12/25/2019 08:09 AM   TRIG 90 12/25/2019 08:09 AM   HDL 46 12/25/2019 08:09 AM   CHOLHDL 4.4 12/25/2019 08:09 AM   CHOLHDL 3.8 07/23/2019 09:04 AM   LDLCALC 139 (H) 12/25/2019 08:09 AM   LDLCALC 109 (H) 07/23/2019 09:04 AM    Wt Readings from Last 3 Encounters:  12/30/19 171 lb 6.4 oz (77.7 kg)  11/27/19 172 lb (78 kg)  10/14/19 171 lb (77.6 kg)     ASSESSMENT & PLAN:    Acute bronchitis Prescribed Prednisone Tessalon PRN Can use Mucinex PRN for cough Nasal saline spray PRN Advised to contact if symptoms are persistent Advised to get tested for COVID  Allergic rhinitis On SIngulair Allegra PRN  Time:   Today, I have spent 12 minutes reviewing the chart, including problem list, medications, and with the patient with telehealth technology discussing the above problems.   Medication Adjustments/Labs and Tests Ordered: Current medicines are reviewed at length with the patient today.  Concerns regarding medicines are outlined above.   Tests Ordered: No orders of the defined types were placed in this encounter.   Medication Changes: Meds ordered this encounter  Medications  . predniSONE  (STERAPRED UNI-PAK 21 TAB) 10 MG (21) TBPK tablet    Sig: Takes as package instructions.    Dispense:  1 each    Refill:  0  . benzonatate (TESSALON) 100 MG capsule    Sig: Take 1 capsule (100 mg total) by mouth 2 (two) times daily as needed for cough.    Dispense:  30 capsule    Refill:  0     Note: This dictation was prepared with Dragon dictation along with smaller phrase technology. Similar sounding words can be transcribed inadequately or may not be corrected upon review. Any transcriptional errors that result from this process are unintentional.      Disposition:  Follow up  Signed, Anabel Halon, MD  01/28/2020 9:15 AM     [facility]

## 2020-02-02 ENCOUNTER — Encounter: Payer: Self-pay | Admitting: Family Medicine

## 2020-02-02 ENCOUNTER — Other Ambulatory Visit: Payer: Self-pay | Admitting: Family Medicine

## 2020-02-02 MED ORDER — LORAZEPAM 1 MG PO TABS: ORAL_TABLET | ORAL | 3 refills | Status: DC

## 2020-02-23 ENCOUNTER — Other Ambulatory Visit: Payer: Self-pay | Admitting: Family Medicine

## 2020-02-24 ENCOUNTER — Encounter: Payer: Self-pay | Admitting: Family Medicine

## 2020-02-24 ENCOUNTER — Other Ambulatory Visit: Payer: Self-pay

## 2020-02-24 ENCOUNTER — Telehealth (INDEPENDENT_AMBULATORY_CARE_PROVIDER_SITE_OTHER): Payer: Medicaid Other | Admitting: Family Medicine

## 2020-02-24 VITALS — Ht 62.0 in

## 2020-02-24 DIAGNOSIS — Z1329 Encounter for screening for other suspected endocrine disorder: Secondary | ICD-10-CM | POA: Diagnosis not present

## 2020-02-24 DIAGNOSIS — E782 Mixed hyperlipidemia: Secondary | ICD-10-CM

## 2020-02-24 DIAGNOSIS — I10 Essential (primary) hypertension: Secondary | ICD-10-CM | POA: Diagnosis not present

## 2020-02-24 DIAGNOSIS — E785 Hyperlipidemia, unspecified: Secondary | ICD-10-CM | POA: Diagnosis not present

## 2020-02-24 DIAGNOSIS — E663 Overweight: Secondary | ICD-10-CM

## 2020-02-24 DIAGNOSIS — E559 Vitamin D deficiency, unspecified: Secondary | ICD-10-CM | POA: Diagnosis not present

## 2020-02-24 DIAGNOSIS — K219 Gastro-esophageal reflux disease without esophagitis: Secondary | ICD-10-CM | POA: Diagnosis not present

## 2020-02-24 DIAGNOSIS — F410 Panic disorder [episodic paroxysmal anxiety] without agoraphobia: Secondary | ICD-10-CM

## 2020-02-24 DIAGNOSIS — F411 Generalized anxiety disorder: Secondary | ICD-10-CM

## 2020-02-24 MED ORDER — LORAZEPAM 1 MG PO TABS: ORAL_TABLET | ORAL | 5 refills | Status: DC

## 2020-02-24 NOTE — Assessment & Plan Note (Signed)
  Patient re-educated about  the importance of commitment to a  minimum of 150 minutes of exercise per week as able.  The importance of healthy food choices with portion control discussed, as well as eating regularly and within a 12 hour window most days. The need to choose "clean , green" food 50 to 75% of the time is discussed, as well as to make water the primary drink and set a goal of 64 ounces water daily.    Weight /BMI 02/24/2020 12/30/2019 11/27/2019  WEIGHT - 171 lb 6.4 oz 172 lb  HEIGHT 5\' 2"  5\' 2"  5\' 2"   BMI 31.35 kg/m2 31.35 kg/m2 31.46 kg/m2

## 2020-02-24 NOTE — Assessment & Plan Note (Signed)
Controlled, no change in medication  

## 2020-02-24 NOTE — Assessment & Plan Note (Signed)
DASH diet and commitment to daily physical activity for a minimum of 30 minutes discussed and encouraged, as a part of hypertension management. The importance of attaining a healthy weight is also discussed.  BP/Weight 02/24/2020 12/30/2019 11/27/2019 10/14/2019 10/09/2019 10/07/2019 07/28/2019  Systolic BP - 145 103 136 128 407 126  Diastolic BP - 85 72 82 83 84 84  Wt. (Lbs) - 171.4 172 171 170 171 174.8  BMI 31.35 31.35 31.46 31.28 31.09 31.28 30.96

## 2020-02-24 NOTE — Progress Notes (Signed)
Virtual Visit via Telephone Note  I connected with Catherine Allen on 02/24/20 at  9:00 AM EST by telephone and verified that I am speaking with the correct person using two identifiers.  Location: Patient: home Provider: work   I discussed the limitations, risks, security and privacy concerns of performing an evaluation and management service by telephone and the availability of in person appointments. I also discussed with the patient that there may be a patient responsible charge related to this service. The patient expressed understanding and agreed to proceed.   History of Present Illness: F/U chronic problems and address any new or current concerns. Review and update medications and allergies. Review recent lab and radiologic data . Update routine health maintainace. Review an encourage improved health habits to include nutrition, exercise and  sleep . Needs to exercise more often Denies recent fever or chills. Denies sinus pressure, nasal congestion, ear pain or sore throat. Denies chest congestion, productive cough or wheezing. Denies chest pains, palpitations and leg swelling Denies abdominal pain, nausea, vomiting,diarrhea or constipation.   Denies dysuria, frequency, hesitancy or incontinence. Denies joint pain, swelling and limitation in mobility. Denies headaches, seizures, numbness, or tingling. Denies depression, anxiety or insomnia. Denies skin break down or rash.        Observations/Objective: Ht 5\' 2"  (1.575 m)   BMI 31.35 kg/m  Good communication with no confusion and intact memory. Alert and oriented x 3 No signs of respiratory distress during speech   Assessment and Plan:  Panic attacks Unchanhged, often awakens pt, uses lorazepam on average 10/ month, refills sent  Hyperlipemia Hyperlipidemia:Low fat diet discussed and encouraged.   Lipid Panel  Lab Results  Component Value Date   CHOL 201 (H) 12/25/2019   HDL 46 12/25/2019   LDLCALC 139  (H) 12/25/2019   TRIG 90 12/25/2019   CHOLHDL 4.4 12/25/2019     Updated lab needed at/ before next visit.   GAD (generalized anxiety disorder) Controlled, no change in medication   GERD (gastroesophageal reflux disease) Controlled, no change in medication   Overweight (BMI 25.0-29.9)  Patient re-educated about  the importance of commitment to a  minimum of 150 minutes of exercise per week as able.  The importance of healthy food choices with portion control discussed, as well as eating regularly and within a 12 hour window most days. The need to choose "clean , green" food 50 to 75% of the time is discussed, as well as to make water the primary drink and set a goal of 64 ounces water daily.    Weight /BMI 02/24/2020 12/30/2019 11/27/2019  WEIGHT - 171 lb 6.4 oz 172 lb  HEIGHT 5\' 2"  5\' 2"  5\' 2"   BMI 31.35 kg/m2 31.35 kg/m2 31.46 kg/m2      Essential hypertension DASH diet and commitment to daily physical activity for a minimum of 30 minutes discussed and encouraged, as a part of hypertension management. The importance of attaining a healthy weight is also discussed.  BP/Weight 02/24/2020 12/30/2019 11/27/2019 10/14/2019 10/09/2019 10/07/2019 07/28/2019  Systolic BP - 145 103 136 128 12/14/2019 126  Diastolic BP - 85 72 82 83 84 84  Wt. (Lbs) - 171.4 172 171 170 171 174.8  BMI 31.35 31.35 31.46 31.28 31.09 31.28 30.96        Follow Up Instructions:    I discussed the assessment and treatment plan with the patient. The patient was provided an opportunity to ask questions and all were answered. The patient agreed with the  plan and demonstrated an understanding of the instructions.   The patient was advised to call back or seek an in-person evaluation if the symptoms worsen or if the condition fails to improve as anticipated.  I provided 23 minutes of non-face-to-face time during this encounter.   Syliva Overman, MD

## 2020-02-24 NOTE — Assessment & Plan Note (Signed)
Unchanhged, often awakens pt, uses lorazepam on average 10/ month, refills sent

## 2020-02-24 NOTE — Addendum Note (Signed)
Addended by: Jerilynn Mages on: 02/24/2020 10:00 AM   Modules accepted: Orders

## 2020-02-24 NOTE — Patient Instructions (Signed)
F/U in 6 months, call if you need me sooner  Please get fasting lipid, cmp and EGFr, TSH and vit D 1 week before next visit  Medications  As discussed, no changes  It is important that you exercise regularly at least 30 minutes 5 times a week. If you develop chest pain, have severe difficulty breathing, or feel very tired, stop exercising immediately and seek medical attention   Think about what you will eat, plan ahead. Choose " clean, green, fresh or frozen" over canned, processed or packaged foods which are more sugary, salty and fatty. 70 to 75% of food eaten should be vegetables and fruit. Three meals at set times with snacks allowed between meals, but they must be fruit or vegetables. Aim to eat over a 12 hour period , example 7 am to 7 pm, and STOP after  your last meal of the day. Drink water,generally about 64 ounces per day, no other drink is as healthy. Fruit juice is best enjoyed in a healthy way, by EATING the fruit. Thanks for choosing [facility], we consider it a privelige to serve you.

## 2020-02-24 NOTE — Assessment & Plan Note (Signed)
Hyperlipidemia:Low fat diet discussed and encouraged.   Lipid Panel  Lab Results  Component Value Date   CHOL 201 (H) 12/25/2019   HDL 46 12/25/2019   LDLCALC 139 (H) 12/25/2019   TRIG 90 12/25/2019   CHOLHDL 4.4 12/25/2019     Updated lab needed at/ before next visit.

## 2020-03-02 ENCOUNTER — Ambulatory Visit: Payer: Medicaid Other | Admitting: Family Medicine

## 2020-03-24 ENCOUNTER — Other Ambulatory Visit: Payer: Self-pay | Admitting: Family Medicine

## 2020-03-24 DIAGNOSIS — A6004 Herpesviral vulvovaginitis: Secondary | ICD-10-CM

## 2020-04-03 NOTE — Progress Notes (Signed)
Referring Provider: Kerri Perches, MD Primary Care Physician:  Kerri Perches, MD Primary GI Physician: Dr. Jena Gauss  Chief Complaint  Patient presents with  . Gastroesophageal Reflux    Doing fine. No issues   . Dysphagia    Doing fine    HPI:   Catherine Allen is a 45 y.o. female with history of GERD and dysphagia presenting today for follow-up.  Also recently underwent colonoscopy 11/27/2019 for colon cancer screening revealing grade 1 internal hemorrhoids, otherwise normal exam.  Next colonoscopy in 2031.  BPE in [place] 2021 entirely normal.  Last EGD was in 2012 with small hiatal hernia and mild erosive reflux esophagitis.  Last seen in our office 10/09/2019.  GERD well controlled on Protonix twice daily.  Dysphagia much improved with better management of GERD.  Rare sensation of food moving down her esophagus slowly but not getting hung.  No other significant GI symptoms.  Today: GERD is well controlled on Protonix 40 mg daily. Decreased to once daily 3 months ago. No dysphagia. No abdominal pain, nausea or vomiting.  Denies BRBPR, melena, constipation, diarrhea, or unintentional weight loss.   Past Medical History:  Diagnosis Date  . Allergy   . Anxiety    Phreesia 07/26/2019  . Dyspareunia   . Hemorrhoids   . HLD (hyperlipidemia)   . HTN (hypertension)   . Hypertension    Phreesia 07/26/2019  . MRSA (methicillin resistant Staphylococcus aureus) 2010   boils thighs  . S/P endoscopy June 2012   Dr. Jena Gauss: mild erosive esophagitis, chronic duodenitis, mild gastritis, stop NSAIDs.   . Vaginitis     Past Surgical History:  Procedure Laterality Date  . CESAREAN SECTION  2004  . CESAREAN SECTION N/A    Phreesia 07/26/2019  . COLONOSCOPY WITH PROPOFOL N/A 11/27/2019   Procedure: COLONOSCOPY WITH PROPOFOL;  Surgeon: Corbin Ade, MD;  [facility];  Service: Endoscopy;  Laterality: N/A;  9:00am  . ESOPHAGOGASTRODUODENOSCOPY  2012   small hiatal  hernia and mild erosive reflux esophagitis    Current Outpatient Medications  Medication Sig Dispense Refill  . acyclovir (ZOVIRAX) 400 MG tablet TAKE 1 TABLET BY MOUTH TWICE A DAY (Patient taking differently: As needed) 60 tablet 2  . amLODipine (NORVASC) 10 MG tablet TAKE 1 TABLET BY MOUTH EVERY DAY 90 tablet 1  . benzonatate (TESSALON) 100 MG capsule Take 1 capsule (100 mg total) by mouth 2 (two) times daily as needed for cough. 30 capsule 0  . busPIRone (BUSPAR) 5 MG tablet TAKE 1 TABLET BY MOUTH TWICE A DAY 180 tablet 1  . fexofenadine (ALLEGRA) 180 MG tablet Take 180 mg by mouth daily. As needed    . levonorgestrel-ethinyl estradiol (SEASONALE) 0.15-0.03 MG tablet TAKE 1 TABLET BY MOUTH EVERY DAY 91 tablet 1  . LORazepam (ATIVAN) 1 MG tablet TAKE 1 TABLET BY MOUTH EVERY DAY AS NEEDED FOR ANXIETY. MUST LAST 30 DAYS PER DOCTOR 10 tablet 0  . montelukast (SINGULAIR) 10 MG tablet TAKE 1 TABLET BY MOUTH EVERYDAY AT BEDTIME (Patient taking differently: Take 10 mg by mouth at bedtime.) 90 tablet 1  . pantoprazole (PROTONIX) 40 MG tablet TAKE 1 TABLET BY MOUTH TWICE A DAY (Patient taking differently: 40 mg daily.) 60 tablet 11  . potassium chloride (KLOR-CON) 10 MEQ tablet TAKE 1 TABLET BY MOUTH TWICE A DAY 180 tablet 1  . rosuvastatin (CRESTOR) 20 MG tablet Take 1 tablet (20 mg total) by mouth daily. 90 tablet 3  .  triamterene-hydrochlorothiazide (MAXZIDE) 75-50 MG tablet TAKE 1 TABLET BY MOUTH DAILY 90 tablet 3   No current facility-administered medications for this visit.    Allergies as of 04/05/2020  . (No Known Allergies)    Family History  Problem Relation Age of Onset  . Hypertension Mother   . Asthma Sister   . Breast cancer Cousin   . Colon cancer Neg Hx     Social History   Socioeconomic History  . Marital status: Single    Spouse name: Not on file  . Number of children: 2  . Years of education: Not on file  . Highest education level: Not on file  Occupational  History  . Occupation: homemaker  Tobacco Use  . Smoking status: Never Smoker  . Smokeless tobacco: Never Used  Substance and Sexual Activity  . Alcohol use: No  . Drug use: No  . Sexual activity: Yes    Partners: Male    Birth control/protection: Pill  Other Topics Concern  . Not on file  Social History Narrative   LIves w/ 2 sons 8 &16   Social Determinants of Health   Financial Resource Strain: Not on file  Food Insecurity: Not on file  Transportation Needs: Not on file  Physical Activity: Not on file  Stress: Not on file  Social Connections: Not on file    Review of Systems: Gen: Denies fever, chills, cold or flulike symptoms, lightheadedness, dizziness, presyncope, syncope. CV: Denies chest pain or palpitations. Resp: Denies dyspnea or cough. GI: See HPI Heme: See HPI  Physical Exam: BP 134/82   Pulse 88   Temp 97.9 F (36.6 C)   Ht 5\' 2"  (1.575 m)   Wt 175 lb (79.4 kg)   LMP 03/01/2020   BMI 32.01 kg/m  General:   Alert and oriented. No distress noted. Pleasant and cooperative.  Head:  Normocephalic and atraumatic. Eyes:  Conjuctiva clear without scleral icterus. Heart:  S1, S2 present without murmurs appreciated. Lungs:  Clear to auscultation bilaterally. No wheezes, rales, or rhonchi. No distress.  Abdomen:  +BS, soft, non-tender and non-distended. No rebound or guarding. No HSM or masses noted. Msk:  Symmetrical without gross deformities. Normal posture. Extremities:  Without edema. Neurologic:  Alert and  oriented x4 Psych: Normal mood and affect.

## 2020-04-05 ENCOUNTER — Other Ambulatory Visit: Payer: Self-pay

## 2020-04-05 ENCOUNTER — Ambulatory Visit (INDEPENDENT_AMBULATORY_CARE_PROVIDER_SITE_OTHER): Payer: Medicaid Other | Admitting: Gastroenterology

## 2020-04-05 ENCOUNTER — Encounter: Payer: Self-pay | Admitting: Gastroenterology

## 2020-04-05 VITALS — BP 134/82 | HR 88 | Temp 97.9°F | Ht 62.0 in | Wt 175.0 lb

## 2020-04-05 DIAGNOSIS — K219 Gastro-esophageal reflux disease without esophagitis: Secondary | ICD-10-CM | POA: Diagnosis not present

## 2020-04-05 NOTE — Assessment & Plan Note (Signed)
45 year old female with chronic history of GERD currently well controlled on Protonix 40 mg daily.  No alarm symptoms.  No other significant GI symptoms.  Plan: 1.  Continue Protonix 40 mg daily 30 minutes before breakfast. 2.  Provided general GERD diet/lifestyle recommendations.  Written instructions were provided. 3.  Follow-up in 1 year or sooner if needed.

## 2020-04-05 NOTE — Progress Notes (Signed)
Cc'ed to pcp °

## 2020-04-05 NOTE — Patient Instructions (Signed)
Continue taking Protonix 40 mg daily 30 minutes before breakfast.  General GERD diet/lifestyle recommendations: Avoid fried, fatty, greasy, spicy, citrus foods. Avoid caffeine and carbonated beverages. Avoid chocolate. Try eating 4-6 small meals a day rather than 3 large meals. Do not eat within 3 hours of laying down. Prop head of bed up on wood or bricks to create a 6 inch incline.   We will plan to see back in 1 year.  Do not hesitate to call if you have any questions or concerns prior.   It was great seeing you today!   Ermalinda Memos, PA-C [facility]

## 2020-05-20 ENCOUNTER — Other Ambulatory Visit: Payer: Self-pay | Admitting: Family Medicine

## 2020-05-20 DIAGNOSIS — A6004 Herpesviral vulvovaginitis: Secondary | ICD-10-CM

## 2020-05-26 ENCOUNTER — Other Ambulatory Visit: Payer: Self-pay | Admitting: Family Medicine

## 2020-06-19 ENCOUNTER — Other Ambulatory Visit: Payer: Self-pay | Admitting: Family Medicine

## 2020-06-23 ENCOUNTER — Encounter: Payer: Self-pay | Admitting: Family Medicine

## 2020-06-29 ENCOUNTER — Ambulatory Visit (HOSPITAL_COMMUNITY)
Admission: RE | Admit: 2020-06-29 | Discharge: 2020-06-29 | Disposition: A | Discharge status: Home / Self Care | Payer: Medicaid Other | Source: Ambulatory Visit | Attending: Family Medicine | Admitting: Family Medicine

## 2020-06-29 ENCOUNTER — Ambulatory Visit (INDEPENDENT_AMBULATORY_CARE_PROVIDER_SITE_OTHER): Payer: Medicaid Other | Admitting: Family Medicine

## 2020-06-29 ENCOUNTER — Other Ambulatory Visit: Payer: Self-pay

## 2020-06-29 ENCOUNTER — Encounter: Payer: Self-pay | Admitting: Family Medicine

## 2020-06-29 VITALS — BP 135/80 | HR 80 | Temp 99.0°F | Resp 18 | Ht 64.0 in | Wt 173.0 lb

## 2020-06-29 DIAGNOSIS — I10 Essential (primary) hypertension: Secondary | ICD-10-CM

## 2020-06-29 DIAGNOSIS — M542 Cervicalgia: Secondary | ICD-10-CM | POA: Insufficient documentation

## 2020-06-29 DIAGNOSIS — R053 Chronic cough: Secondary | ICD-10-CM

## 2020-06-29 DIAGNOSIS — U099 Post covid-19 condition, unspecified: Secondary | ICD-10-CM

## 2020-06-29 MED ORDER — IBUPROFEN 800 MG PO TABS: 800.0000 mg | ORAL_TABLET | Freq: Three times a day (TID) | ORAL | 0 refills | Status: DC

## 2020-06-29 MED ORDER — FAMOTIDINE 40 MG PO TABS
40.0000 mg | ORAL_TABLET | Freq: Every day | ORAL | 0 refills | Status: DC
Start: 2020-06-29 — End: 2020-07-23

## 2020-06-29 MED ORDER — PREDNISONE 10 MG PO TABS
10.0000 mg | ORAL_TABLET | Freq: Two times a day (BID) | ORAL | 0 refills | Status: DC
Start: 2020-06-29 — End: 2020-07-23

## 2020-06-29 NOTE — Patient Instructions (Addendum)
F/U in August as before, call if you need me sooner  3 meds are sent for right neck pain and an X ray is alos ordereed  Please get fasting labs ordered in January

## 2020-07-02 ENCOUNTER — Encounter: Payer: Self-pay | Admitting: Family Medicine

## 2020-07-02 DIAGNOSIS — U099 Post covid-19 condition, unspecified: Secondary | ICD-10-CM | POA: Insufficient documentation

## 2020-07-02 DIAGNOSIS — R053 Chronic cough: Secondary | ICD-10-CM | POA: Insufficient documentation

## 2020-07-02 DIAGNOSIS — M542 Cervicalgia: Secondary | ICD-10-CM | POA: Insufficient documentation

## 2020-07-02 NOTE — Assessment & Plan Note (Signed)
Improving over time, normal exam , pt reassured

## 2020-07-02 NOTE — Assessment & Plan Note (Signed)
X ray of neck, and ibuprofen, prednisone and pspcid short term

## 2020-07-02 NOTE — Assessment & Plan Note (Signed)
Controlled, no change in medication DASH diet and commitment to daily physical activity for a minimum of 30 minutes discussed and encouraged, as a part of hypertension management. The importance of attaining a healthy weight is also discussed.  BP/Weight 06/29/2020 04/05/2020 02/24/2020 12/30/2019 11/27/2019 10/14/2019 10/09/2019  Systolic BP 135 134 - 145 103 381 128  Diastolic BP 80 82 - 85 72 82 83  Wt. (Lbs) 173 175 - 171.4 172 171 170  BMI 29.7 32.01 31.35 31.35 31.46 31.28 31.09

## 2020-07-02 NOTE — Progress Notes (Signed)
   Catherine Allen     MRN: [id number]      DOB: 03/06/1975   HPI Catherine Allen is here WITH A 2 MONTH H/O RIGHT SHOULDER PAIN AND ALSO HAS  HAD A POST COVID COUGH X 1 MONTH, IMPROVING BUT STILL LINGERS  ROS Denies recent fever or chills. Denies sinus pressure, nasal congestion, ear pain or sore throat. Denies chest congestion, productive cough or whe Denies chest pains, palpitations and leg swelling Denies abdominal pain, nausea, vomiting,diarrhea or constipation.   Denies headaches, seizures, numbness, or tingling. Denies depression, anxiety or insomnia. Denies skin break down or rash.   PE  BP 135/80 (BP Location: Right Arm, Patient Position: Sitting, Cuff Size: Large)   Pulse 80   Temp 99 F (37.2 C)   Resp 18   Ht 5\' 4"  (1.626 m)   Wt 173 lb (78.5 kg)   SpO2 98%   BMI 29.70 kg/m   Patient alert and oriented and in no cardiopulmonary distress.  HEENT: No facial asymmetry, EOMI,     Neck decreased ROM with right spasm.  Chest: Clear to auscultation bilaterally.  CVS: S1, S2 no murmurs, no S3.Regular rate.  ABD: Soft non tender.   Ext: No edema  MS: Adequate ROM spine, shoulders, hips and knees.  Skin: Intact, no ulcerations or rash noted.  Psych: Good eye contact, normal affect. Memory intact not anxious or depressed appearing.  CNS: CN 2-12 intact, power,  normal throughout.no focal deficits noted.   Assessment & Plan  Neck pain on right side X ray of neck, and ibuprofen, prednisone and pspcid short term  Essential hypertension Controlled, no change in medication DASH diet and commitment to daily physical activity for a minimum of 30 minutes discussed and encouraged, as a part of hypertension management. The importance of attaining a healthy weight is also discussed.  BP/Weight 06/29/2020 04/05/2020 02/24/2020 12/30/2019 11/27/2019 10/14/2019 10/09/2019  Systolic BP 135 134 - 145 103 10/11/2019 128  Diastolic BP 80 82 - 85 72 82 83  Wt. 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

## 2020-07-15 ENCOUNTER — Encounter: Payer: Self-pay | Admitting: Family Medicine

## 2020-07-16 ENCOUNTER — Other Ambulatory Visit: Payer: Self-pay

## 2020-07-16 DIAGNOSIS — M542 Cervicalgia: Secondary | ICD-10-CM

## 2020-07-16 NOTE — Telephone Encounter (Signed)
Referral order sent to [facility]

## 2020-07-19 ENCOUNTER — Encounter: Payer: Self-pay | Admitting: Orthopaedic Surgery

## 2020-07-23 ENCOUNTER — Encounter: Payer: Self-pay | Admitting: Orthopedic Surgery

## 2020-07-23 ENCOUNTER — Ambulatory Visit (INDEPENDENT_AMBULATORY_CARE_PROVIDER_SITE_OTHER): Payer: Medicaid Other | Admitting: Orthopedic Surgery

## 2020-07-23 ENCOUNTER — Other Ambulatory Visit: Payer: Self-pay

## 2020-07-23 VITALS — BP 140/86 | HR 84 | Ht 64.0 in | Wt 174.0 lb

## 2020-07-23 DIAGNOSIS — M5412 Radiculopathy, cervical region: Secondary | ICD-10-CM

## 2020-07-23 MED ORDER — CYCLOBENZAPRINE HCL 10 MG PO TABS: 10.0000 mg | ORAL_TABLET | Freq: Two times a day (BID) | ORAL | 0 refills | Status: DC | PRN

## 2020-07-23 NOTE — Progress Notes (Signed)
New Patient Visit  Assessment: Catherine Allen is a 45 y.o. female with the following: 1. Cervical radiculopathy  Plan: Patient has continued pain into the right shoulder, which started with spasming pain in her neck.  This been ongoing for couple of months now.  Pain improved while taking prednisone, but returned after she completed her course.  On physical exam, she has no pain with strength testing of her bilateral shoulders.  She has excellent range of motion without discomfort.  I have little concern for shoulder injury.  It is possible that she is having some spasm pain, without radiculopathy, however the description of her pain, as well as the location without obvious shoulder injury suggests that she may have some pathology within the cervical spine.  She is interested in a cervical spine MRI.  We will place the order today, and work on authorization.  Once the MRI has been scheduled, she can contact the clinic to schedule a follow-up appointment.   Follow-up: Return for After MRI.  Subjective:  Chief Complaint  Patient presents with   Neck Pain    Pt with Rt side neck pain for 2 months. Pt states she does feel the pain going into shoulder and upper arm. Has been on steroids and Ibuprofen 800mg  and pain came back after she finished the course. Has not done any formal PT for pain.    History of Present Illness: Catherine Allen is a 45 y.o. female who has been referred to clinic today by 53, MD for evaluation of right neck and shoulder pain.  She denies a specific injury.  However, approximately 2 months ago, she started to have pain and spasming in the right side of her neck.  This is since spread to her right shoulder.  The pain will radiate into her shoulder, distally towards the elbow.  She has no numbness or tingling extending all the way down her arms.  Pain is worse at night.  She took prednisone for a week, which improved her symptoms, but pain relief was not  sustained.  No injuries to her right shoulder.  She does not note any issues with movement of the right shoulder.  The bulk of her pain is in the posterior aspect of her right shoulder, and into her deltoid.   Review of Systems: No fevers or chills No numbness or tingling No chest pain No shortness of breath No bowel or bladder dysfunction No GI distress No headaches   Medical History:  Past Medical History:  Diagnosis Date   Allergy    Anxiety    Phreesia 07/26/2019   Dyspareunia    Hemorrhoids    HLD (hyperlipidemia)    HTN (hypertension)    Hypertension    Phreesia 07/26/2019   MRSA (methicillin resistant Staphylococcus aureus) 2010   boils thighs   S/P endoscopy June 2012   Dr. July 2012: mild erosive esophagitis, chronic duodenitis, mild gastritis, stop NSAIDs.    Vaginitis     Past Surgical History:  Procedure Laterality Date   CESAREAN SECTION  2004   CESAREAN SECTION N/A    Phreesia 07/26/2019   COLONOSCOPY WITH PROPOFOL N/A 11/27/2019   Procedure: COLONOSCOPY WITH PROPOFOL;  Surgeon: 11/29/2019, MD;  Location: [facility];  Service: Endoscopy;  Laterality: N/A;  9:00am   ESOPHAGOGASTRODUODENOSCOPY  2012   small hiatal hernia and mild erosive reflux esophagitis    Family History  Problem Relation Age of Onset   Hypertension Mother  Asthma Sister    Breast cancer Cousin    Colon cancer Neg Hx    Social History   Tobacco Use   Smoking status: Never   Smokeless tobacco: Never  Substance Use Topics   Alcohol use: No   Drug use: No    No Known Allergies  Current Meds  Medication Sig   acyclovir (ZOVIRAX) 400 MG tablet TAKE 1 TABLET BY MOUTH TWICE A DAY   amLODipine (NORVASC) 10 MG tablet TAKE 1 TABLET BY MOUTH EVERY DAY   busPIRone (BUSPAR) 5 MG tablet TAKE 1 TABLET BY MOUTH TWICE A DAY   cyclobenzaprine (FLEXERIL) 10 MG tablet Take 1 tablet (10 mg total) by mouth 2 (two) times daily as needed for muscle spasms.   fexofenadine (ALLEGRA) 180  MG tablet Take 180 mg by mouth daily. As needed   levonorgestrel-ethinyl estradiol (SEASONALE) 0.15-0.03 MG tablet TAKE 1 TABLET BY MOUTH EVERY DAY   LORazepam (ATIVAN) 1 MG tablet TAKE 1 TABLET BY MOUTH EVERY DAY AS NEEDED FOR ANXIETY. MUST LAST 30 DAYS PER DOCTOR   montelukast (SINGULAIR) 10 MG tablet TAKE 1 TABLET BY MOUTH EVERYDAY AT BEDTIME (Patient taking differently: Take 10 mg by mouth at bedtime.)   pantoprazole (PROTONIX) 40 MG tablet TAKE 1 TABLET BY MOUTH TWICE A DAY (Patient taking differently: 40 mg daily.)   potassium chloride (KLOR-CON) 10 MEQ tablet TAKE 1 TABLET BY MOUTH TWICE A DAY   rosuvastatin (CRESTOR) 20 MG tablet Take 1 tablet (20 mg total) by mouth daily.   triamterene-hydrochlorothiazide (MAXZIDE) 75-50 MG tablet TAKE 1 TABLET BY MOUTH EVERY DAY    Objective: BP 140/86   Pulse 84   Ht 5\' 4"  (1.626 m)   Wt 174 lb (78.9 kg)   BMI 29.87 kg/m   Physical Exam:  General: Alert and oriented. and No acute distress. Gait: Normal gait.  Evaluation of the neck and the right shoulder demonstrates no deformity.  No atrophy is appreciated.  She has full range of motion including, flexion, extension, rotation to the left and the right.  Forward flexion to 160 degrees, internal rotation to T12, external rotation at her side to 45 degrees, all without discomfort.  Supraspinatus, infraspinatus and subscapularis testing is all 5/5 strength.  No focal areas of tenderness.  Negative Tinel's at bilateral carpal tunnels.  Negative carpal tunnel compression test bilaterally.  Fingers are warm well perfused.  Full sensation intact to both hands.  IMAGING: I personally reviewed images previously obtained in clinic  Cervical spine x-rays demonstrates loss of the normal cervical curvature.  Well-maintained disc heights.  No obvious spondylolisthesis.  Minimal osteophytes are noted.   New Medications:  Meds ordered this encounter  Medications   cyclobenzaprine (FLEXERIL) 10 MG tablet     Sig: Take 1 tablet (10 mg total) by mouth 2 (two) times daily as needed for muscle spasms.    Dispense:  20 tablet    Refill:  0      , MD  07/23/2020 11:57 AM

## 2020-08-12 MED ORDER — DIAZEPAM 5 MG PO TABS: 5.0000 mg | ORAL_TABLET | Freq: Four times a day (QID) | ORAL | 0 refills | Status: DC | PRN

## 2020-08-16 ENCOUNTER — Other Ambulatory Visit: Payer: Self-pay | Admitting: Orthopedic Surgery

## 2020-08-16 MED ORDER — IBUPROFEN 800 MG PO TABS: 800.0000 mg | ORAL_TABLET | Freq: Three times a day (TID) | ORAL | 0 refills | Status: DC

## 2020-08-16 MED ORDER — PREDNISONE 10 MG (48) PO TBPK: ORAL_TABLET | Freq: Every day | ORAL | 0 refills | Status: DC

## 2020-08-16 NOTE — Progress Notes (Signed)
Meds ordered this encounter  Medications   ibuprofen (ADVIL) 800 MG tablet    Sig: Take 1 tablet (800 mg total) by mouth 3 (three) times daily.    Dispense:  15 tablet    Refill:  0   predniSONE (STERAPRED UNI-PAK 48 TAB) 10 MG (48) TBPK tablet    Sig: Take by mouth daily. 10 ds 12 days as directed    Dispense:  48 tablet    Refill:  0

## 2020-08-20 DIAGNOSIS — E663 Overweight: Secondary | ICD-10-CM | POA: Diagnosis not present

## 2020-08-20 DIAGNOSIS — Z1329 Encounter for screening for other suspected endocrine disorder: Secondary | ICD-10-CM | POA: Diagnosis not present

## 2020-08-20 DIAGNOSIS — F410 Panic disorder [episodic paroxysmal anxiety] without agoraphobia: Secondary | ICD-10-CM | POA: Diagnosis not present

## 2020-08-20 DIAGNOSIS — E559 Vitamin D deficiency, unspecified: Secondary | ICD-10-CM | POA: Diagnosis not present

## 2020-08-20 DIAGNOSIS — F411 Generalized anxiety disorder: Secondary | ICD-10-CM | POA: Diagnosis not present

## 2020-08-20 DIAGNOSIS — I10 Essential (primary) hypertension: Secondary | ICD-10-CM | POA: Diagnosis not present

## 2020-08-20 DIAGNOSIS — E782 Mixed hyperlipidemia: Secondary | ICD-10-CM | POA: Diagnosis not present

## 2020-08-20 DIAGNOSIS — K219 Gastro-esophageal reflux disease without esophagitis: Secondary | ICD-10-CM | POA: Diagnosis not present

## 2020-08-21 LAB — VITAMIN D 25 HYDROXY (VIT D DEFICIENCY, FRACTURES): Vit D, 25-Hydroxy: 39.1 ng/mL (ref 30.0–100.0)

## 2020-08-21 LAB — CMP14+EGFR
ALT: 19 IU/L (ref 0–32)
AST: 14 IU/L (ref 0–40)
Albumin/Globulin Ratio: 1.6 (ref 1.2–2.2)
Albumin: 4.2 g/dL (ref 3.8–4.8)
Alkaline Phosphatase: 64 IU/L (ref 44–121)
BUN/Creatinine Ratio: 15 (ref 9–23)
BUN: 13 mg/dL (ref 6–24)
Bilirubin Total: 0.3 mg/dL (ref 0.0–1.2)
CO2: 21 mmol/L (ref 20–29)
Calcium: 9.4 mg/dL (ref 8.7–10.2)
Chloride: 101 mmol/L (ref 96–106)
Creatinine, Ser: 0.89 mg/dL (ref 0.57–1.00)
Globulin, Total: 2.6 g/dL (ref 1.5–4.5)
Glucose: 138 mg/dL — ABNORMAL HIGH (ref 65–99)
Potassium: 4.1 mmol/L (ref 3.5–5.2)
Sodium: 135 mmol/L (ref 134–144)
Total Protein: 6.8 g/dL (ref 6.0–8.5)
eGFR: 81 mL/min/{1.73_m2} (ref >59)

## 2020-08-21 LAB — LIPID PANEL
Chol/HDL Ratio: 3.6 ratio (ref 0.0–4.4)
Cholesterol, Total: 185 mg/dL (ref 100–199)
HDL: 52 mg/dL (ref >39)
LDL Chol Calc (NIH): 121 mg/dL — ABNORMAL HIGH (ref 0–99)
Triglycerides: 65 mg/dL (ref 0–149)
VLDL Cholesterol Cal: 12 mg/dL (ref 5–40)

## 2020-08-21 LAB — TSH: TSH: 1.01 u[IU]/mL (ref 0.450–4.500)

## 2020-08-23 ENCOUNTER — Ambulatory Visit (HOSPITAL_COMMUNITY)
Admission: RE | Admit: 2020-08-23 | Discharge: 2020-08-23 | Disposition: A | Discharge status: Home / Self Care | Payer: Medicaid Other | Source: Ambulatory Visit | Attending: Orthopedic Surgery | Admitting: Orthopedic Surgery

## 2020-08-23 ENCOUNTER — Other Ambulatory Visit: Payer: Self-pay

## 2020-08-23 ENCOUNTER — Other Ambulatory Visit: Payer: Self-pay | Admitting: Family Medicine

## 2020-08-23 DIAGNOSIS — R200 Anesthesia of skin: Secondary | ICD-10-CM | POA: Diagnosis not present

## 2020-08-23 DIAGNOSIS — M50121 Cervical disc disorder at C4-C5 level with radiculopathy: Secondary | ICD-10-CM | POA: Diagnosis not present

## 2020-08-23 DIAGNOSIS — M5412 Radiculopathy, cervical region: Secondary | ICD-10-CM | POA: Insufficient documentation

## 2020-08-23 DIAGNOSIS — M50122 Cervical disc disorder at C5-C6 level with radiculopathy: Secondary | ICD-10-CM | POA: Diagnosis not present

## 2020-08-23 DIAGNOSIS — M50123 Cervical disc disorder at C6-C7 level with radiculopathy: Secondary | ICD-10-CM | POA: Diagnosis not present

## 2020-08-24 ENCOUNTER — Other Ambulatory Visit: Payer: Self-pay | Admitting: Family Medicine

## 2020-08-24 ENCOUNTER — Ambulatory Visit (INDEPENDENT_AMBULATORY_CARE_PROVIDER_SITE_OTHER): Payer: Medicaid Other | Admitting: Family Medicine

## 2020-08-24 ENCOUNTER — Encounter: Payer: Self-pay | Admitting: Family Medicine

## 2020-08-24 VITALS — BP 138/88 | HR 96 | Resp 16 | Ht 64.0 in | Wt 176.1 lb

## 2020-08-24 DIAGNOSIS — E782 Mixed hyperlipidemia: Secondary | ICD-10-CM

## 2020-08-24 DIAGNOSIS — M542 Cervicalgia: Secondary | ICD-10-CM

## 2020-08-24 DIAGNOSIS — I10 Essential (primary) hypertension: Secondary | ICD-10-CM | POA: Diagnosis not present

## 2020-08-24 DIAGNOSIS — R7301 Impaired fasting glucose: Secondary | ICD-10-CM | POA: Insufficient documentation

## 2020-08-24 DIAGNOSIS — E663 Overweight: Secondary | ICD-10-CM | POA: Diagnosis not present

## 2020-08-24 LAB — POCT GLYCOSYLATED HEMOGLOBIN (HGB A1C): Hemoglobin A1C: 5.5 % (ref 4.0–5.6)

## 2020-08-24 MED ORDER — LIDOCAINE 5 % EX PTCH: 1.0000 | MEDICATED_PATCH | CUTANEOUS | 1 refills | Status: DC

## 2020-08-24 MED ORDER — METHYLPREDNISOLONE ACETATE 80 MG/ML IJ SUSP
80.0000 mg | Freq: Once | INTRAMUSCULAR | Status: AC
  Administered 2020-08-24: 80 mg via INTRAMUSCULAR

## 2020-08-24 MED ORDER — KETOROLAC TROMETHAMINE 60 MG/2ML IM SOLN
60.0000 mg | Freq: Once | INTRAMUSCULAR | Status: AC
  Administered 2020-08-24: 60 mg via INTRAMUSCULAR

## 2020-08-24 MED ORDER — NAPROXEN SODIUM 220 MG PO TABS: 220.0000 mg | ORAL_TABLET | Freq: Two times a day (BID) | ORAL | 0 refills | Status: DC | PRN

## 2020-08-24 MED ORDER — GABAPENTIN 100 MG PO CAPS: 100.0000 mg | ORAL_CAPSULE | Freq: Every day | ORAL | 3 refills | Status: DC

## 2020-08-24 NOTE — Patient Instructions (Addendum)
F/U in 3 months, call if you need me sooner  Toradol 60 mg and depo medrol 80 mg IM in the office today  New for pain from arthritis is naproxen, one to two daily if needed, gabapentin one at night, and lidoderm patch  Thanks for choosing [facility], we consider it a privelige to serve you.

## 2020-08-24 NOTE — Assessment & Plan Note (Signed)
Controlled, no change in medication DASH diet and commitment to daily physical activity for a minimum of 30 minutes discussed and encouraged, as a part of hypertension management. The importance of attaining a healthy weight is also discussed.  BP/Weight 08/24/2020 07/23/2020 06/29/2020 04/05/2020 02/24/2020 12/30/2019 11/27/2019  Systolic BP 138 140 135 134 - 459 103  Diastolic BP 88 86 80 82 - 85 72  Wt. (Lbs) 176.12 174 173 175 - 171.4 172  BMI 30.23 29.87 29.7 32.01 31.35 31.35 31.46

## 2020-08-24 NOTE — Assessment & Plan Note (Signed)
Uncontrolled.Toradol and depo medrol administered IM in the office , to be followed by a short course of  NSAIDS.Marland Kitchenatryt daily lidoderm patch and bedtime gabapentin

## 2020-08-24 NOTE — Assessment & Plan Note (Signed)
Patient educated about the importance of limiting  Carbohydrate intake , the need to commit to daily physical activity for a minimum of 30 minutes , and to commit weight loss. The fact that changes in all these areas will reduce or eliminate all together the development of diabetes is stressed.  Normoglycemic, recently on high dose steroids  Diabetic Labs Latest Ref Rng & Units 08/24/2020 08/20/2020 12/25/2019 07/23/2019 01/24/2019  HbA1c 4.0 - 5.6 % 5.5 - - - -  Chol 100 - 199 mg/dL - 569 794(I) 016 553  HDL >39 mg/dL - 52 46 74(M) 27(M)  Calc LDL 0 - 99 mg/dL - 786(L) 544(B) 201(E) 116(H)  Triglycerides 0 - 149 mg/dL - 65 90 84 071  Creatinine 0.57 - 1.00 mg/dL - 2.19 7.58(I) 3.25 4.98   BP/Weight 08/24/2020 07/23/2020 06/29/2020 04/05/2020 02/24/2020 12/30/2019 11/27/2019  Systolic BP 138 140 135 134 - 264 103  Diastolic BP 88 86 80 82 - 85 72  Wt. (Lbs) 176.12 174 173 175 - 171.4 172  BMI 30.23 29.87 29.7 32.01 31.35 31.35 31.46   No flowsheet data found.

## 2020-08-24 NOTE — Progress Notes (Signed)
Catherine Allen     MRN: [id number]      DOB: 12/23/1975   HPI Catherine Allen is here for follow up and re-evaluation of chronic medical conditions, medication management and review of any available recent lab and radiology data.  Preventive health is updated, specifically  Cancer screening and Immunization.   Questions or concerns regarding consultations or procedures which the PT has had in the interim are  addressed. The PT denies any adverse reactions to current medications since the last visit.  Still experiencing daily uncontrolled neck and shoulder pain. Stressed due to ill health of her mom who is recently placed in hospice   ROS Denies recent fever or chills. Denies sinus pressure, nasal congestion, ear pain or sore throat. Denies chest congestion, productive cough or wheezing. Denies chest pains, palpitations and leg swelling Denies abdominal pain, nausea, vomiting,diarrhea or constipation.   Denies dysuria, frequency, hesitancy or incontinence. . Denies headaches, seizures, a. Denies skin break down or rash.   PE  BP 138/88   Pulse 96   Resp 16   Ht 5\' 4"  (1.626 m)   Wt 176 lb 1.9 oz (79.9 kg)   SpO2 98%   BMI 30.23 kg/m   Patient alert and oriented and in no cardiopulmonary distress.  HEENT: No facial asymmetry, EOMI,     Neck decreased ROM.  Chest: Clear to auscultation bilaterally.  CVS: S1, S2 no murmurs, no S3.Regular rate.  ABD: Soft non tender.   Ext: No edema  MS: Adequate ROM spine, shoulders, hips and knees.  Skin: Intact, no ulcerations or rash noted.  Psych: Good eye contact, normal affect. Memory intact not anxious or depressed appearing.  CNS: CN 2-12 intact, power,  normal throughout.no focal deficits noted.   Assessment & Plan  Neck pain on right side Uncontrolled.Toradol and depo medrol administered IM in the office , to be followed by a short course of  NSAIDS. atryt daily lidoderm patch and bedtime gabapentin  Essential  hypertension Controlled, no change in medication DASH diet and commitment to daily physical activity for a minimum of 30 minutes discussed and encouraged, as a part of hypertension management. The importance of attaining a healthy weight is also discussed.  BP/Weight 08/24/2020 07/23/2020 06/29/2020 04/05/2020 02/24/2020 12/30/2019 11/27/2019  Systolic BP 138 140 135 134 - 11/29/2019 103  Diastolic BP 88 86 80 82 - 85 72  Wt. (Lbs) 176.12 174 173 175 - 171.4 172  BMI 30.23 29.87 29.7 32.01 31.35 31.35 31.46       Overweight (BMI 25.0-29.9)  Patient re-educated about  the importance of commitment to a  minimum of 150 minutes of exercise per week as able.  The importance of healthy food choices with portion control discussed, as well as eating regularly and within a 12 hour window most days. The need to choose "clean , green" food 50 to 75% of the time is discussed, as well as to make water the primary drink and set a goal of 64 ounces water daily.    Weight /BMI 08/24/2020 07/23/2020 06/29/2020  WEIGHT 176 lb 1.9 oz 174 lb 173 lb  HEIGHT 5\' 4"  5\' 4"  5\' 4"   BMI 30.23 kg/m2 29.87 kg/m2 29.7 kg/m2      Hyperlipemia Hyperlipidemia:Low fat diet discussed and encouraged.   Lipid Panel  Lab Results  Component Value Date   CHOL 185 08/20/2020   HDL 52 08/20/2020   LDLCALC 121 (H) 08/20/2020   TRIG 65 08/20/2020   CHOLHDL 3.6 08/20/2020  Needs to reduce fried and fatty foods  IFG (impaired fasting glucose) Patient educated about the importance of limiting  Carbohydrate intake , the need to commit to daily physical activity for a minimum of 30 minutes , and to commit weight loss. The fact that changes in all these areas will reduce or eliminate all together the development of diabetes is stressed.  Normoglycemic, recently on high dose steroids  Diabetic Labs Latest Ref Rng & Units 08/24/2020 08/20/2020 12/25/2019 07/23/2019 01/24/2019  HbA1c 4.0 - 5.6 % 5.5 - - - -  Chol 100 - 199 mg/dL -  401 027(O) 536 644  HDL >39 mg/dL - 52 46 03(K) 74(Q)  Calc LDL 0 - 99 mg/dL - 595(G) 387(F) 643(P) 116(H)  Triglycerides 0 - 149 mg/dL - 65 90 84 295  Creatinine 0.57 - 1.00 mg/dL - 1.88 4.16(S) 0.63 0.16   BP/Weight 08/24/2020 07/23/2020 06/29/2020 04/05/2020 02/24/2020 12/30/2019 11/27/2019  Systolic BP 138 140 135 134 - 010 103  Diastolic BP 88 86 80 82 - 85 72  Wt. (Lbs) 176.12 174 173 175 - 171.4 172  BMI 30.23 29.87 29.7 32.01 31.35 31.35 31.46   No flowsheet data found.

## 2020-08-24 NOTE — Assessment & Plan Note (Signed)
Hyperlipidemia:Low fat diet discussed and encouraged.   Lipid Panel  Lab Results  Component Value Date   CHOL 185 08/20/2020   HDL 52 08/20/2020   LDLCALC 121 (H) 08/20/2020   TRIG 65 08/20/2020   CHOLHDL 3.6 08/20/2020     Needs to reduce fried and fatty foods

## 2020-08-24 NOTE — Assessment & Plan Note (Signed)
  Patient re-educated about  the importance of commitment to a  minimum of 150 minutes of exercise per week as able.  The importance of healthy food choices with portion control discussed, as well as eating regularly and within a 12 hour window most days. The need to choose "clean , green" food 50 to 75% of the time is discussed, as well as to make water the primary drink and set a goal of 64 ounces water daily.    Weight /BMI 08/24/2020 07/23/2020 06/29/2020  WEIGHT 176 lb 1.9 oz 174 lb 173 lb  HEIGHT 5\' 4"  5\' 4"  5\' 4"   BMI 30.23 kg/m2 29.87 kg/m2 29.7 kg/m2

## 2020-08-27 ENCOUNTER — Encounter: Payer: Self-pay | Admitting: Family Medicine

## 2020-08-27 ENCOUNTER — Other Ambulatory Visit: Payer: Self-pay | Admitting: Family Medicine

## 2020-08-27 MED ORDER — LORAZEPAM 1 MG PO TABS: ORAL_TABLET | ORAL | 5 refills | Status: DC

## 2020-09-01 DIAGNOSIS — H5213 Myopia, bilateral: Secondary | ICD-10-CM | POA: Diagnosis not present

## 2020-09-01 DIAGNOSIS — H01001 Unspecified blepharitis right upper eyelid: Secondary | ICD-10-CM | POA: Diagnosis not present

## 2020-09-01 DIAGNOSIS — H25093 Other age-related incipient cataract, bilateral: Secondary | ICD-10-CM | POA: Diagnosis not present

## 2020-09-01 DIAGNOSIS — H01002 Unspecified blepharitis right lower eyelid: Secondary | ICD-10-CM | POA: Diagnosis not present

## 2020-09-02 DIAGNOSIS — H5213 Myopia, bilateral: Secondary | ICD-10-CM | POA: Diagnosis not present

## 2020-09-08 ENCOUNTER — Other Ambulatory Visit: Payer: Self-pay | Admitting: Family Medicine

## 2020-11-16 ENCOUNTER — Other Ambulatory Visit: Payer: Self-pay

## 2020-11-16 DIAGNOSIS — M5412 Radiculopathy, cervical region: Secondary | ICD-10-CM

## 2020-11-17 ENCOUNTER — Telehealth: Payer: Self-pay | Admitting: Orthopedic Surgery

## 2020-11-17 NOTE — Telephone Encounter (Signed)
Spoke with pt and apologized for the confusion. Due to her current pain that's going to both shoulders, provider suggested sending pt to [facility]. Referral was placed and pt has already made an appointment. Let pt know that she wouldn't need appointment with Dr. Dallas Schimke unless she just wanted to go over results. Pt states she's fine with just going to [facility].

## 2020-11-17 NOTE — Telephone Encounter (Signed)
Patient wants to know if she needs to be seen next week?  She said we referred her to another doctor, they called her last night.    I asked her if she needed to review her MRI she said no her primary doctor went over it with her at her office visit with her.   Please call her back

## 2020-11-18 ENCOUNTER — Other Ambulatory Visit: Payer: Self-pay | Admitting: Family Medicine

## 2020-11-19 ENCOUNTER — Other Ambulatory Visit: Payer: Self-pay | Admitting: Family Medicine

## 2020-11-22 ENCOUNTER — Telehealth: Payer: Self-pay | Admitting: Family Medicine

## 2020-11-22 NOTE — Telephone Encounter (Signed)
..  Patient declines further follow up and engagement by the Managed Medicaid Team. Appropriate care team members and provider have been notified via electronic communication. The Managed Medicaid Team is available to follow up with the patient after provider conversation with the patient regarding recommendation for engagement and subsequent re-referral to the Managed Medicaid Team.    Jennifer Alley Care Guide, High Risk Medicaid Managed Care Embedded Care Coordination [place]  [facility]   

## 2020-11-23 ENCOUNTER — Ambulatory Visit: Payer: Medicaid Other | Admitting: Orthopedic Surgery

## 2020-11-24 DIAGNOSIS — M5010 Cervical disc disorder with radiculopathy, unspecified cervical region: Secondary | ICD-10-CM | POA: Diagnosis not present

## 2020-11-24 DIAGNOSIS — Z6830 Body mass index (BMI) 30.0-30.9, adult: Secondary | ICD-10-CM | POA: Diagnosis not present

## 2020-11-26 ENCOUNTER — Ambulatory Visit (INDEPENDENT_AMBULATORY_CARE_PROVIDER_SITE_OTHER): Payer: Medicaid Other | Admitting: Family Medicine

## 2020-11-26 ENCOUNTER — Encounter: Payer: Self-pay | Admitting: Family Medicine

## 2020-11-26 ENCOUNTER — Other Ambulatory Visit: Payer: Self-pay

## 2020-11-26 VITALS — BP 134/78 | HR 75 | Resp 17 | Ht 63.0 in | Wt 174.0 lb

## 2020-11-26 DIAGNOSIS — M542 Cervicalgia: Secondary | ICD-10-CM | POA: Diagnosis not present

## 2020-11-26 DIAGNOSIS — E782 Mixed hyperlipidemia: Secondary | ICD-10-CM

## 2020-11-26 DIAGNOSIS — R7301 Impaired fasting glucose: Secondary | ICD-10-CM

## 2020-11-26 DIAGNOSIS — Z1231 Encounter for screening mammogram for malignant neoplasm of breast: Secondary | ICD-10-CM

## 2020-11-26 DIAGNOSIS — I10 Essential (primary) hypertension: Secondary | ICD-10-CM | POA: Diagnosis not present

## 2020-11-26 DIAGNOSIS — K219 Gastro-esophageal reflux disease without esophagitis: Secondary | ICD-10-CM

## 2020-11-26 DIAGNOSIS — Z23 Encounter for immunization: Secondary | ICD-10-CM | POA: Diagnosis not present

## 2020-11-26 DIAGNOSIS — J3089 Other allergic rhinitis: Secondary | ICD-10-CM | POA: Diagnosis not present

## 2020-11-26 MED ORDER — PANTOPRAZOLE SODIUM 40 MG PO TBEC: 40.0000 mg | DELAYED_RELEASE_TABLET | Freq: Every day | ORAL | 5 refills | Status: DC

## 2020-11-26 MED ORDER — GABAPENTIN 300 MG PO CAPS: 300.0000 mg | ORAL_CAPSULE | Freq: Every day | ORAL | 5 refills | Status: DC

## 2020-11-26 NOTE — Patient Instructions (Addendum)
Annual; exam in mid Feb, call if you need me sooner  Flu vaccine today  Check pharmacy for TdAP and get this if cost effective  Fasting lipid, cmp and EGFr and CBc 1 week before next visit  Please schedule mammogram at checkout  Dose increase in gabapentin to 300 mg at bedtime  Lorazepam wikll be prescribed as before  It is important that you exercise regularly at least 30 minutes 5 times a week. If you develop chest pain, have severe difficulty breathing, or feel very tired, stop exercising immediately and seek medical attention   Think about what you will eat, plan ahead. Choose " clean, green, fresh or frozen" over canned, processed or packaged foods which are more sugary, salty and fatty. 70 to 75% of food eaten should be vegetables and fruit. Three meals at set times with snacks allowed between meals, but they must be fruit or vegetables. Aim to eat over a 12 hour period , example 7 am to 7 pm, and STOP after  your last meal of the day. Drink water,generally about 64 ounces per day, no other drink is as healthy. Fruit juice is best enjoyed in a healthy way, by EATING the fruit.  

## 2020-11-28 ENCOUNTER — Encounter: Payer: Self-pay | Admitting: Family Medicine

## 2020-11-28 MED ORDER — LORAZEPAM 1 MG PO TABS: ORAL_TABLET | ORAL | 5 refills | Status: DC

## 2020-11-28 NOTE — Assessment & Plan Note (Signed)
Controlled, no change in medication DASH diet and commitment to daily physical activity for a minimum of 30 minutes discussed and encouraged, as a part of hypertension management. The importance of attaining a healthy weight is also discussed.  BP/Weight 11/26/2020 08/24/2020 07/23/2020 06/29/2020 04/05/2020 02/24/2020 12/30/2019  Systolic BP 134 138 140 135 134 - 145  Diastolic BP 78 88 86 80 82 - 85  Wt. (Lbs) 174.04 176.12 174 173 175 - 171.4  BMI 30.83 30.23 29.87 29.7 32.01 31.35 31.35

## 2020-11-28 NOTE — Assessment & Plan Note (Signed)
Controlled, no change in medication  

## 2020-11-28 NOTE — Progress Notes (Signed)
Catherine Allen     MRN: [id number]      DOB: 05/15/1975   HPI [name] is here for follow up and re-evaluation of chronic medical conditions, medication management and review of any available recent lab and radiology data.  Preventive health is updated, specifically  Cancer screening and Immunization.   Questions or concerns regarding consultations or procedures which the PT has had in the interim are  addressed. The PT denies any adverse reactions to current medications since the last visit.  There are no new concerns.  There are no specific complaints   ROS Denies recent fever or chills. Denies sinus pressure, nasal congestion, ear pain or sore throat. Denies chest congestion, productive cough or wheezing. Denies chest pains, palpitations and leg swelling Denies abdominal pain, nausea, vomiting,diarrhea or constipation.   Denies dysuria, frequency, hesitancy or incontinence. Denies joint pain, swelling and limitation in mobility. Denies headaches, seizures, numbness, or tingling. Denies depression, anxiety or insomnia. Denies skin break down or rash.   PE  BP 134/78   Pulse 75   Resp 17   Ht 5\' 3"  (1.6 m)   Wt 174 lb 0.6 oz (78.9 kg)   SpO2 95%   BMI 30.83 kg/m   Patient alert and oriented and in no cardiopulmonary distress.  HEENT: No facial asymmetry, EOMI,     Neck supple .  Chest: Clear to auscultation bilaterally.  CVS: S1, S2 no murmurs, no S3.Regular rate.  ABD: Soft non tender.   Ext: No edema  MS: Adequate ROM spine, shoulders, hips and knees.  Skin: Intact, no ulcerations or rash noted.  Psych: Good eye contact, normal affect. Memory intact not anxious or depressed appearing.  CNS: CN 2-12 intact, power,  normal throughout.no focal deficits noted.   Assessment & Plan  Allergic rhinitis Controlled, no change in medication   Essential hypertension Controlled, no change in medication DASH diet and commitment to daily physical activity  for a minimum of 30 minutes discussed and encouraged, as a part of hypertension management. The importance of attaining a healthy weight is also discussed.  BP/Weight 11/26/2020 08/24/2020 07/23/2020 06/29/2020 04/05/2020 02/24/2020 12/30/2019  Systolic BP 134 138 140 135 134 - 145  Diastolic BP 78 88 86 80 82 - 85  Wt. (Lbs) 174.04 176.12 174 173 175 - 171.4  BMI 30.83 30.23 29.87 29.7 32.01 31.35 31.35       Hyperlipemia Hyperlipidemia:Low fat diet discussed and encouraged.   Lipid Panel  Lab Results  Component Value Date   CHOL 185 08/20/2020   HDL 52 08/20/2020   LDLCALC 121 (H) 08/20/2020   TRIG 65 08/20/2020   CHOLHDL 3.6 08/20/2020     Updated lab needed at/ before next visit. Not at goal  IFG (impaired fasting glucose) Patient educated about the importance of limiting  Carbohydrate intake , the need to commit to daily physical activity for a minimum of 30 minutes , and to commit weight loss. The fact that changes in all these areas will reduce or eliminate all together the development of diabetes is stressed.  improved  Diabetic Labs Latest Ref Rng & Units 08/24/2020 08/20/2020 12/25/2019 07/23/2019 01/24/2019  HbA1c 4.0 - 5.6 % 5.5 - - - -  Chol 100 - 199 mg/dL - 01/26/2019 409) 811(B 147  HDL >39 mg/dL - 52 46 829) 56(O)  Calc LDL 0 - 99 mg/dL - 13(Y) 865(H) 846(N) 116(H)  Triglycerides 0 - 149 mg/dL - 65 90 84 629(B  Creatinine 0.57 -  1.00 mg/dL - 1.27 5.17(G) 0.17 4.94   BP/Weight 11/26/2020 08/24/2020 07/23/2020 06/29/2020 04/05/2020 02/24/2020 12/30/2019  Systolic BP 134 138 140 135 134 - 145  Diastolic BP 78 88 86 80 82 - 85  Wt. (Lbs) 174.04 176.12 174 173 175 - 171.4  BMI 30.83 30.23 29.87 29.7 32.01 31.35 31.35   No flowsheet data found.    Neck pain on right side As had neurosurgery eval is in therapy and scheduled to get epidural injections, increase gabapentin dose to 300 mg at bedtime  GERD (gastroesophageal reflux disease) Controlled, no change in  medication

## 2020-11-28 NOTE — Assessment & Plan Note (Signed)
Hyperlipidemia:Low fat diet discussed and encouraged.   Lipid Panel  Lab Results  Component Value Date   CHOL 185 08/20/2020   HDL 52 08/20/2020   LDLCALC 121 (H) 08/20/2020   TRIG 65 08/20/2020   CHOLHDL 3.6 08/20/2020     Updated lab needed at/ before next visit. Not at goal

## 2020-11-28 NOTE — Assessment & Plan Note (Signed)
Patient educated about the importance of limiting  Carbohydrate intake , the need to commit to daily physical activity for a minimum of 30 minutes , and to commit weight loss. The fact that changes in all these areas will reduce or eliminate all together the development of diabetes is stressed.  improved  Diabetic Labs Latest Ref Rng & Units 08/24/2020 08/20/2020 12/25/2019 07/23/2019 01/24/2019  HbA1c 4.0 - 5.6 % 5.5 - - - -  Chol 100 - 199 mg/dL - 216 244(C) 950 722  HDL >39 mg/dL - 52 46 57(D) 05(X)  Calc LDL 0 - 99 mg/dL - 833(P) 825(P) 898(M) 116(H)  Triglycerides 0 - 149 mg/dL - 65 90 84 210  Creatinine 0.57 - 1.00 mg/dL - 3.12 8.11(W) 8.67 7.37   BP/Weight 11/26/2020 08/24/2020 07/23/2020 06/29/2020 04/05/2020 02/24/2020 12/30/2019  Systolic BP 134 138 140 135 134 - 145  Diastolic BP 78 88 86 80 82 - 85  Wt. (Lbs) 174.04 176.12 174 173 175 - 171.4  BMI 30.83 30.23 29.87 29.7 32.01 31.35 31.35   No flowsheet data found.

## 2020-11-28 NOTE — Assessment & Plan Note (Signed)
As had neurosurgery eval is in therapy and scheduled to get epidural injections, increase gabapentin dose to 300 mg at bedtime

## 2020-12-15 ENCOUNTER — Other Ambulatory Visit: Payer: Self-pay

## 2020-12-15 ENCOUNTER — Ambulatory Visit (HOSPITAL_COMMUNITY): Payer: Medicaid Other | Attending: Neurosurgery | Admitting: Physical Therapy

## 2020-12-15 ENCOUNTER — Encounter (HOSPITAL_COMMUNITY): Payer: Self-pay | Admitting: Physical Therapy

## 2020-12-15 DIAGNOSIS — M5410 Radiculopathy, site unspecified: Secondary | ICD-10-CM

## 2020-12-15 DIAGNOSIS — R29898 Other symptoms and signs involving the musculoskeletal system: Secondary | ICD-10-CM

## 2020-12-15 DIAGNOSIS — M542 Cervicalgia: Secondary | ICD-10-CM | POA: Diagnosis not present

## 2020-12-15 NOTE — Therapy (Signed)
[facility] [address] [place], [place], [address] Phone: [phone]   Fax:  [phone]  Physical Therapy Evaluation  Patient Details  Name: Catherine Allen MRN: [id number] Date of Birth: 03/19/1975 Referring Provider (PT): Lisbeth Renshaw, MD   Encounter Date: 12/15/2020   PT End of Session - 12/15/20 1050     Visit Number 1    Number of Visits 8    Date for PT Re-Evaluation 02/09/21    Authorization Type medicaid [employer]- 27 combined VL no auth for patient's over 21    Authorization - Visit Number 1    Authorization - Number of Visits 27    Progress Note Due on Visit 10    PT Start Time 1051    PT Stop Time 1126    PT Time Calculation (min) 35 min    Activity Tolerance Patient limited by pain    Behavior During Therapy [facility] for tasks assessed/performed             Past Medical History:  Diagnosis Date   Allergy    Anxiety    [employer]rees[employer] 07/26/2019   Dyspareunia    Hemorrhoids    HLD (hyperlipidemia)    HTN (hypertension)    Hypertension    [employer] 07/26/2019   MRSA (methicillin resistant Staphylococcus aureus) 2010   boils thighs   S/P endoscopy June 2012   Dr. Jena Gauss: mild erosive esophagitis, chronic duodenitis, mild gastritis, stop NSAIDs.    Vaginitis     Past Surgical History:  Procedure Laterality Date   CESAREAN SECTION  2004   CESAREAN SECTION N/A    [employer]reesia 07/26/2019   COLONOSCOPY WITH PROPOFOL N/A 11/27/2019   Procedure: COLONOSCOPY WITH PROPOFOL;  Surgeon: Corbin Ade, MD;  Location: [facility];  Service: Endoscopy;  Laterality: N/A;  9:00am   ESOPHAGOGASTRODUODENOSCOPY  2012   small hiatal hernia and mild erosive reflux esophagitis    There were no vitals filed for this visit.    Subjective Assessment - 12/15/20 1056     Subjective States that in May this year she started with stiff neck symptoms on the right. States that she could feel the achiness and it radiated down  her right arm. States that prior to the MRI she felt the pain radiate to the left side of her neck and now that it is going down her left arm. State that her arm is tingling and numb in her second and third fingers. States that bending over and postural positions can increase her symptoms. Current symptoms in her arm are 6/10 and the spot in her mid back is 8/10 and it is a constant ache. Holding herself in good posture increases her symptoms on the left arm but supporting her back reduces her symptoms. States that she no longer has any symptoms on the right. States she has difficulty sleeping and is constantly changing positions. Getting injection next week in neck    Pertinent History carpal tunnel bilaterally, HTN,    Limitations Sitting;Walking;House hold activities;Standing    Patient Stated Goals have less pain/releif of numbness and tingling/pain    Currently in Pain? Yes    Pain Score 6     Pain Location Arm    Pain Orientation Left    Pain Descriptors / Indicators Aching;Tingling;Numbness    Pain Type Chronic pain    Pain Radiating Towards down left arm to fingers    Aggravating Factors  holding self upright    Pain  Relieving Factors ice,                [facility] PT Assessment - 12/15/20 0001       Assessment   Medical Diagnosis neck and shoulder pain    Referring Provider (PT) Lisbeth Renshaw, MD    Next MD Visit --   unsure   Prior Therapy never      Balance Screen   Has the patient fallen in the past 6 months No      Prior Function   Level of Independence Independent    Vocation Part time employment    Vocation Requirements intermittent standing/sitting    Leisure watching movies and going out to eat      Cognition   Overall Cognitive Status Within Functional Limits for tasks assessed      Observation/Other Assessments   Observations rounded shoulders, forward head, posterior tilt sitting    Focus on Therapeutic Outcomes (FOTO)  NA      ROM / Strength   AROM /  PROM / Strength AROM;Strength      AROM   Overall AROM Comments shoulder ROM WNL but increased left  mid back pain with all left shoulder ROM    AROM Assessment Site Cervical;Shoulder    Cervical Flexion 45   increased numbness in left hand/arm and increased back pain   Cervical Extension 55   relief of symptoms with flexion   Cervical - Right Side Bend 28   reduced back and arm symptoms   Cervical - Left Side Bend 12   increased back and left arm symptom   Cervical - Right Rotation 65   decreased tingling in left arm   Cervical - Left Rotation 65   increased tingling in left arm     Palpation   Palpation comment tenderness to palpation along left UT, rhomboids and thoracic paraspinals      Special Tests    Special Tests Thoracic Outlet Syndrome    Other special tests Roo's test neg, shoulder abduction relief neg bilateral, Spurling's negative, negative distraction test                        Objective measurements completed on examination: See above findings.       [facility] Adult PT Treatment/Exercise - 12/15/20 0001       Exercises   Exercises Neck      Neck Exercises: Supine   Neck Retraction 10 reps   5" holds, 50% ROM                    PT Education - 12/15/20 1131     Education Details on current presentation, on HEP on POC, on dry needling, on findings    Person(s) Educated Patient    Methods Explanation    Comprehension Verbalized understanding              PT Short Term Goals - 12/15/20 1137       PT SHORT TERM GOAL #1   Title Patient will be able to demonstrate cervical ROM without increase in neck or arm symtpoms    Time 4    Period Weeks    Status New    Target Date 01/12/21      PT SHORT TERM GOAL #2   Title Patient will report at least 50% improvement in overall symptoms and/or function to demonstrate improved functional mobility    Time 4    Period Weeks  Status New    Target Date 01/12/21      PT SHORT TERM  GOAL #3   Title Patient will be independent in self management strategies to improve quality of life and functional outcomes.    Time 4    Period Weeks    Status New    Target Date 01/12/21               PT Long Term Goals - 12/15/20 1138       PT LONG TERM GOAL #1   Title Patient will report at least 75% improvement in overall symptoms and/or function to demonstrate improved functional mobility    Time 8    Period Weeks    Status New    Target Date 02/09/21      PT LONG TERM GOAL #2   Title Patient will report no radicular symptoms in left arm    Time 8    Period Weeks    Status New    Target Date 02/09/21      PT LONG TERM GOAL #3   Title Patient will be able to demonstrate good sitting and standing posture without increase in symptoms    Time 8    Period Weeks    Status New    Target Date 02/09/21                    Plan - 12/15/20 1128     Clinical Impression Statement Patient is a 45 y.o. female who presents to physical therapy with complaint of neck pain and numbness in left UE and hand. Patient demonstrates decreased strength, ROM restriction, reduced flexibility, increased tenderness to palpation and postural abnormalities which are likely contributing to symptoms of pain and are negatively impacting patient ability to perform ADLs. Patient will benefit from skilled physical therapy services to address these deficits to reduce pain and improve level of function with ADLs    Personal Factors and Comorbidities Fitness    Examination-Activity Limitations Sleep;Sit;Lift;Carry    Examination-Participation Restrictions Occupation;Driving;Shop;Cleaning    Stability/Clinical Decision Making Stable/Uncomplicated    Clinical Decision Making Low    Rehab Potential Good    PT Frequency Other (comment)   1-2x/week for total of 8 visits over 8 week certification   PT Duration 8 weeks    PT Treatment/Interventions ADLs/Self Care Home  Management;Cryotherapy;Electrical Stimulation;Moist Heat;Traction;Therapeutic exercise;Manual techniques;Therapeutic activities;Neuromuscular re-education;Patient/family education;Dry needling;Joint Manipulations;Spinal Manipulations    PT Next Visit Plan assess thoracic/rib mobility, possible DN, posture, assess neural tension of UE    PT Home Exercise Plan chin tuck    Consulted and Agree with Plan of Care Patient             Patient will benefit from skilled therapeutic intervention in order to improve the following deficits and impairments:  Pain, Decreased range of motion, Decreased strength, Decreased activity tolerance, Impaired UE functional use, Postural dysfunction, Improper body mechanics  Visit Diagnosis: Cervicalgia  Decreased range of motion of neck  Radicular pain of left lower extremity     Problem List Patient Active Problem List   Diagnosis Date Noted   IFG (impaired fasting glucose) 08/24/2020   Neck pain on right side 07/02/2020   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 07/02/2020   Allergic dermatitis 10/17/2019   GERD (gastroesophageal reflux disease) 04/07/2019   Bunion of great toe of right foot 11/22/2017   Decreased vision in both eyes 06/08/2017   Panic attacks 11/22/2016   Breast calcifications on mammogram  09/11/2016   GAD (generalized anxiety disorder) 07/14/2015   Allergic rhinitis 01/22/2014   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

## 2020-12-16 ENCOUNTER — Other Ambulatory Visit: Payer: Self-pay | Admitting: Family Medicine

## 2020-12-16 DIAGNOSIS — A6004 Herpesviral vulvovaginitis: Secondary | ICD-10-CM

## 2020-12-20 DIAGNOSIS — M5010 Cervical disc disorder with radiculopathy, unspecified cervical region: Secondary | ICD-10-CM | POA: Diagnosis not present

## 2020-12-24 ENCOUNTER — Other Ambulatory Visit: Payer: Self-pay | Admitting: Gastroenterology

## 2020-12-24 ENCOUNTER — Other Ambulatory Visit: Payer: Self-pay | Admitting: Family Medicine

## 2021-01-05 ENCOUNTER — Ambulatory Visit (HOSPITAL_COMMUNITY)
Admission: RE | Admit: 2021-01-05 | Discharge: 2021-01-05 | Disposition: A | Discharge status: Home / Self Care | Payer: Medicaid Other | Source: Ambulatory Visit | Attending: Family Medicine | Admitting: Family Medicine

## 2021-01-05 ENCOUNTER — Other Ambulatory Visit: Payer: Self-pay

## 2021-01-05 DIAGNOSIS — Z1231 Encounter for screening mammogram for malignant neoplasm of breast: Secondary | ICD-10-CM | POA: Insufficient documentation

## 2021-01-13 ENCOUNTER — Other Ambulatory Visit: Payer: Self-pay

## 2021-01-13 ENCOUNTER — Encounter (HOSPITAL_COMMUNITY): Payer: Self-pay | Admitting: Physical Therapy

## 2021-01-13 ENCOUNTER — Ambulatory Visit (HOSPITAL_COMMUNITY): Payer: Medicaid Other | Attending: Neurosurgery | Admitting: Physical Therapy

## 2021-01-13 DIAGNOSIS — M542 Cervicalgia: Secondary | ICD-10-CM | POA: Diagnosis present

## 2021-01-13 DIAGNOSIS — R29898 Other symptoms and signs involving the musculoskeletal system: Secondary | ICD-10-CM | POA: Insufficient documentation

## 2021-01-13 DIAGNOSIS — M5410 Radiculopathy, site unspecified: Secondary | ICD-10-CM | POA: Diagnosis present

## 2021-01-13 NOTE — Therapy (Signed)
[facility] [address] [place], [place], [address] Phone: [phone]   Fax:  [phone]  Physical Therapy Treatment  Patient Details  Name: Catherine Allen MRN: [id number] Date of Birth: December 11, 1975 Referring Provider (PT): Lisbeth Renshaw, MD   Encounter Date: 01/13/2021   PT End of Session - 01/13/21 1653     Visit Number 2    Number of Visits 8    Date for PT Re-Evaluation 02/09/21    Authorization Type medicaid united healthcare- 27 combined VL no auth for patient's over 21    Authorization - Visit Number 2    Authorization - Number of Visits 27    Progress Note Due on Visit 10    PT Start Time 1646    PT Stop Time 1728    PT Time Calculation (min) 42 min    Activity Tolerance Patient tolerated treatment well    Behavior During Therapy [facility] for tasks assessed/performed             Past Medical History:  Diagnosis Date   Allergy    Anxiety    Phreesia 07/26/2019   Dyspareunia    Hemorrhoids    HLD (hyperlipidemia)    HTN (hypertension)    Hypertension    Phreesia 07/26/2019   MRSA (methicillin resistant Staphylococcus aureus) 2010   boils thighs   S/P endoscopy June 2012   Dr. Jena Gauss: mild erosive esophagitis, chronic duodenitis, mild gastritis, stop NSAIDs.    Vaginitis     Past Surgical History:  Procedure Laterality Date   CESAREAN SECTION  2004   CESAREAN SECTION N/A    Phreesia 07/26/2019   COLONOSCOPY WITH PROPOFOL N/A 11/27/2019   Procedure: COLONOSCOPY WITH PROPOFOL;  Surgeon: Corbin Ade, MD;  [facility];  Service: Endoscopy;  Laterality: N/A;  9:00am   ESOPHAGOGASTRODUODENOSCOPY  2012   small hiatal hernia and mild erosive reflux esophagitis    There were no vitals filed for this visit.   Subjective Assessment - 01/13/21 1651     Subjective Doing ok today. Pain is better. Had injections which had been helping.    Pertinent History carpal tunnel bilaterally, HTN,     Limitations Sitting;Walking;House hold activities;Standing    Patient Stated Goals have less pain/releif of numbness and tingling/pain    Currently in Pain? No/denies                               [facility] Adult PT Treatment/Exercise - 01/13/21 0001       Neck Exercises: Seated   Other Seated Exercise chin tuck 10 x 5", median nerve glide x15, scap retraction 10 x 5"    Other Seated Exercise cervical rotation in tall sitting x 10      Neck Exercises: Stretches   Other Neck Stretches doorway stretch 3 x 20"                       PT Short Term Goals - 12/15/20 1137       PT SHORT TERM GOAL #1   Title Patient will be able to demonstrate cervical ROM without increase in neck or arm symtpoms    Time 4    Period Weeks    Status New    Target Date 01/12/21      PT SHORT TERM GOAL #2   Title Patient will report at least 50% improvement in overall  symptoms and/or function to demonstrate improved functional mobility    Time 4    Period Weeks    Status New    Target Date 01/12/21      PT SHORT TERM GOAL #3   Title Patient will be independent in self management strategies to improve quality of life and functional outcomes.    Time 4    Period Weeks    Status New    Target Date 01/12/21               PT Long Term Goals - 12/15/20 1138       PT LONG TERM GOAL #1   Title Patient will report at least 75% improvement in overall symptoms and/or function to demonstrate improved functional mobility    Time 8    Period Weeks    Status New    Target Date 02/09/21      PT LONG TERM GOAL #2   Title Patient will report no radicular symptoms in left arm    Time 8    Period Weeks    Status New    Target Date 02/09/21      PT LONG TERM GOAL #3   Title Patient will be able to demonstrate good sitting and standing posture without increase in symptoms    Time 8    Period Weeks    Status New    Target Date 02/09/21                    Plan - 01/13/21 1727     Clinical Impression Statement Patient tolerated session well today. Reviewed HEP and progressed postural strengthening for reduced neural tension. Patient with slight response to median nerve tension testing, so median nerve floss added. Patient required verbal cues for proper form with seated chin tucks to avoid excess flexion. Educated patient on cervical spine anatomy and mechanics at spine with chin tuck and cervical flexion, and possible contribution to radicular symptoms. Issued updated HEP. Patient will continue to benefit from skilled therapy services to reduce deficits and improve function.    Personal Factors and Comorbidities Fitness    Examination-Activity Limitations Sleep;Sit;Lift;Carry    Examination-Participation Restrictions Occupation;Driving;Shop;Cleaning    Stability/Clinical Decision Making Stable/Uncomplicated    Rehab Potential Good    PT Frequency Other (comment)   1-2x/week for total of 8 visits over 8 week certification   PT Duration 8 weeks    PT Treatment/Interventions ADLs/Self Care Home Management;Cryotherapy;Electrical Stimulation;Moist Heat;Traction;Therapeutic exercise;Manual techniques;Therapeutic activities;Neuromuscular re-education;Patient/family education;Dry needling;Joint Manipulations;Spinal Manipulations    PT Next Visit Plan assess thoracic/rib mobility, possible DN, posture, assess neural tension of UE    PT Home Exercise Plan chin tuck 1/5 pec stretch, scap retraction, median nerve glide    Consulted and Agree with Plan of Care Patient             Patient will benefit from skilled therapeutic intervention in order to improve the following deficits and impairments:  Pain, Decreased range of motion, Decreased strength, Decreased activity tolerance, Impaired UE functional use, Postural dysfunction, Improper body mechanics  Visit Diagnosis: Cervicalgia  Decreased range of motion of neck  Radicular pain of left lower  extremity     Problem List Patient Active Problem List   Diagnosis Date Noted   IFG (impaired fasting glucose) 08/24/2020   Neck pain on right side 07/02/2020   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 07/02/2020   Allergic dermatitis 10/17/2019   GERD (gastroesophageal reflux disease) 04/07/2019  Bunion of great toe of right foot 11/22/2017   Decreased vision in both eyes 06/08/2017   Panic attacks 11/22/2016   Breast calcifications on mammogram 09/11/2016   GAD (generalized anxiety disorder) 07/14/2015   Allergic rhinitis 01/22/2014   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 [address] [place], [place], [address] Phone: [phone]   Fax:  [phone]  Name: Raychell D Tschantz MRN: [id number] Date of Birth: 11-25-1975

## 2021-01-13 NOTE — Patient Instructions (Signed)
Access Code: [email or web address] URL: [email or web address] Date: 01/13/2021 Prepared by: Josue Hector  Exercises Seated Scapular Retraction - 2-3 x daily - 7 x weekly - 2 sets - 10 reps [place] Stretch - 2-3 x daily - 7 x weekly - 1 sets - 3 reps - 20 second hold Median Nerve Flossing - Tray - 2-3 x daily - 7 x weekly - 2 sets - 10 reps

## 2021-01-17 ENCOUNTER — Ambulatory Visit (HOSPITAL_COMMUNITY): Payer: Medicaid Other | Admitting: Physical Therapy

## 2021-01-17 ENCOUNTER — Encounter (HOSPITAL_COMMUNITY): Payer: Self-pay | Admitting: Physical Therapy

## 2021-01-17 ENCOUNTER — Other Ambulatory Visit: Payer: Self-pay

## 2021-01-17 DIAGNOSIS — M5410 Radiculopathy, site unspecified: Secondary | ICD-10-CM

## 2021-01-17 DIAGNOSIS — M542 Cervicalgia: Secondary | ICD-10-CM

## 2021-01-17 DIAGNOSIS — R29898 Other symptoms and signs involving the musculoskeletal system: Secondary | ICD-10-CM

## 2021-01-17 NOTE — Patient Instructions (Signed)
Access Code: [email or web address] URL: [email or web address] Date: 01/17/2021 Prepared by: Georges Lynch  Exercises Standing Shoulder Row with Anchored Resistance - 2-3 x daily - 7 x weekly - 2 sets - 15 reps Shoulder extension with resistance - Neutral - 2-3 x daily - 7 x weekly - 2 sets - 15 reps Shoulder External Rotation and Scapular Retraction with Resistance - 2-3 x daily - 7 x weekly - 2 sets - 15 reps - 5 second hold

## 2021-01-17 NOTE — Therapy (Signed)
[place], [place], [address] Phone: [phone]   Fax:  [phone]  Physical Therapy Treatment  Patient Details  Name: Catherine Allen MRN: [id number] Date of Birth: Mar 09, 1975 Referring Provider (PT): Consuella Lose, MD   Encounter Date: 01/17/2021   PT End of Session - 01/17/21 1643     Visit Number 3    Number of Visits 8    Date for PT Re-Evaluation 02/09/21    Authorization Type medicaid united healthcare- 27 combined VL no auth for patient's over 21    Authorization - Visit Number 3    Authorization - Number of Visits 27    Progress Note Due on Visit 10    PT Start Time [id number]    PT Stop Time 1722    PT Time Calculation (min) 39 min    Activity Tolerance Patient tolerated treatment well    Behavior During Therapy [facility] for tasks assessed/performed             Past Medical History:  Diagnosis Date   Allergy    Anxiety    Phreesia 07/26/2019   Dyspareunia    Hemorrhoids    HLD (hyperlipidemia)    HTN (hypertension)    Hypertension    Phreesia 07/26/2019   MRSA (methicillin resistant Staphylococcus aureus) 2010   boils thighs   S/P endoscopy June 2012   Dr. Gala Romney: mild erosive esophagitis, chronic duodenitis, mild gastritis, stop NSAIDs.    Vaginitis     Past Surgical History:  Procedure Laterality Date   CESAREAN SECTION  2004   CESAREAN SECTION N/A    Phreesia 07/26/2019   COLONOSCOPY WITH PROPOFOL N/A 11/27/2019   Procedure: COLONOSCOPY WITH PROPOFOL;  Surgeon: Daneil Dolin, MD;  [facility];  Service: Endoscopy;  Laterality: N/A;  9:00am   ESOPHAGOGASTRODUODENOSCOPY  2012   small hiatal hernia and mild erosive reflux esophagitis    There were no vitals filed for this visit.   Subjective Assessment - 01/17/21 1643     Subjective Doing pretty good. Has not been having as much numbness and tingling lately.    Pertinent History carpal tunnel bilaterally, HTN,     Limitations Sitting;Walking;House hold activities;Standing    Patient Stated Goals have less pain/releif of numbness and tingling/pain    Currently in Pain? No/denies                               [facility] Adult PT Treatment/Exercise - 01/17/21 0001       Neck Exercises: Machines for Strengthening   UBE (Upper Arm Bike) 3/3 FWD/ Back      Neck Exercises: Standing   Other Standing Exercises band rows and extension GTB x30 each, band ER 2 x 15 wiht 5 second holds      Neck Exercises: Seated   Neck Retraction 20 reps    Other Seated Exercise chin tuck with extension x 10, scapular retraction 20 x 3", median nerve glide x20 (with lateral cervical flexion)    Other Seated Exercise thoracic extension over foam roll 20 x 3"      Neck Exercises: Stretches   Other Neck Stretches doorway stretch 3 x 20"                       PT Short Term Goals - 12/15/20 1137       PT SHORT TERM GOAL #  1   Title Patient will be able to demonstrate cervical ROM without increase in neck or arm symtpoms    Time 4    Period Weeks    Status New    Target Date 01/12/21      PT SHORT TERM GOAL #2   Title Patient will report at least 50% improvement in overall symptoms and/or function to demonstrate improved functional mobility    Time 4    Period Weeks    Status New    Target Date 01/12/21      PT SHORT TERM GOAL #3   Title Patient will be independent in self management strategies to improve quality of life and functional outcomes.    Time 4    Period Weeks    Status New    Target Date 01/12/21               PT Long Term Goals - 12/15/20 1138       PT LONG TERM GOAL #1   Title Patient will report at least 75% improvement in overall symptoms and/or function to demonstrate improved functional mobility    Time 8    Period Weeks    Status New    Target Date 02/09/21      PT LONG TERM GOAL #2   Title Patient will report no radicular symptoms in left arm     Time 8    Period Weeks    Status New    Target Date 02/09/21      PT LONG TERM GOAL #3   Title Patient will be able to demonstrate good sitting and standing posture without increase in symptoms    Time 8    Period Weeks    Status New    Target Date 02/09/21                   Plan - 01/17/21 1713     Clinical Impression Statement Patient tolerated session well today. Progressed scapular and postural strengthening with added band resistance. Educated patient on proper form and function of added exercises, Patient required verbal cues for hand position during band rows and shoulder external rotation. Patient noting mild muscle fatigue in UEs end of session. Issued updated HEP handout. Patient will continue to benefit from postural strength progressions to reduce neck and radicular symptoms for improved functional ability.    Personal Factors and Comorbidities Fitness    Examination-Activity Limitations Sleep;Sit;Lift;Carry    Examination-Participation Restrictions Occupation;Driving;Shop;Cleaning    Stability/Clinical Decision Making Stable/Uncomplicated    Rehab Potential Good    PT Frequency Other (comment)   1-2x/week for total of 8 visits over 8 week certification   PT Duration 8 weeks    PT Treatment/Interventions ADLs/Self Care Home Management;Cryotherapy;Electrical Stimulation;Moist Heat;Traction;Therapeutic exercise;Manual techniques;Therapeutic activities;Neuromuscular re-education;Patient/family education;Dry needling;Joint Manipulations;Spinal Manipulations    PT Next Visit Plan Progress posture strengthening as tolerated    PT Home Exercise Plan chin tuck 1/5 pec stretch, scap retraction, median nerve glide 1/9 band row, extension, shoulder ER    Consulted and Agree with Plan of Care Patient             Patient will benefit from skilled therapeutic intervention in order to improve the following deficits and impairments:  Pain, Decreased range of motion, Decreased  strength, Decreased activity tolerance, Impaired UE functional use, Postural dysfunction, Improper body mechanics  Visit Diagnosis: Cervicalgia  Decreased range of motion of neck  Radicular pain of left lower extremity  Problem List Patient Active Problem List   Diagnosis Date Noted   IFG (impaired fasting glucose) 08/24/2020   Neck pain on right side 07/02/2020   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 07/02/2020   Allergic dermatitis 10/17/2019   GERD (gastroesophageal reflux disease) 04/07/2019   Bunion of great toe of right foot 11/22/2017   Decreased vision in both eyes 06/08/2017   Panic attacks 11/22/2016   Breast calcifications on mammogram 09/11/2016   GAD (generalized anxiety disorder) 07/14/2015   Allergic rhinitis 01/22/2014   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

## 2021-01-19 ENCOUNTER — Telehealth (HOSPITAL_COMMUNITY): Payer: Self-pay | Admitting: Physical Therapy

## 2021-01-19 ENCOUNTER — Ambulatory Visit (HOSPITAL_COMMUNITY): Payer: Medicaid Other | Admitting: Physical Therapy

## 2021-01-19 NOTE — Telephone Encounter (Signed)
No show. Patient reported her apt was not on my chart. Confirmed next apt with patient.  4:35 PM, 01/19/21 Catherine Allen, DPT Physical Therapy with [facility]  [phone] office

## 2021-01-24 ENCOUNTER — Encounter (HOSPITAL_COMMUNITY): Payer: Self-pay | Admitting: Physical Therapy

## 2021-01-24 ENCOUNTER — Ambulatory Visit (HOSPITAL_COMMUNITY): Payer: Medicaid Other | Admitting: Physical Therapy

## 2021-01-24 ENCOUNTER — Other Ambulatory Visit: Payer: Self-pay

## 2021-01-24 DIAGNOSIS — M5410 Radiculopathy, site unspecified: Secondary | ICD-10-CM

## 2021-01-24 DIAGNOSIS — R29898 Other symptoms and signs involving the musculoskeletal system: Secondary | ICD-10-CM

## 2021-01-24 DIAGNOSIS — M542 Cervicalgia: Secondary | ICD-10-CM

## 2021-01-24 NOTE — Patient Instructions (Signed)
Access Code: [email or web address] URL: [email or web address] Date: 01/24/2021 Prepared by: Georges Lynch  Exercises Seated Thoracic Lumbar Extension with Pectoralis Stretch - 2-3 x daily - 7 x weekly - 1 sets - 10 reps - 5 second hold Standing Shoulder Horizontal Abduction with Resistance - 2-3 x daily - 7 x weekly - 2 sets - 10-15 reps - 5 second hold

## 2021-01-25 NOTE — Progress Notes (Signed)
°   01/24/21 0001  Neck Exercises: Machines for Strengthening  UBE (Upper Arm Bike) 3/3 FWD/ Back  Neck Exercises: Standing  Other Standing Exercises band rows and extension GTB x30 each, band ER 2 x 15 wiht 5 second holds, band horiz abuction GTB 2 x 15, median nerve glides  Neck Exercises: Seated  Neck Retraction 15 reps  Manual Therapy  Manual Therapy Joint mobilization  Manual therapy comments Completed separate from all other activity  Joint Mobilization Grade II-III P/A mobs thoracic spine T1-T5

## 2021-01-25 NOTE — Therapy (Signed)
[place] [facility] [address] [place], [place], [address] Phone: [phone]   Fax:  [phone]  Physical Therapy Treatment  Patient Details  Name: Catherine Allen MRN: [id number] Date of Birth: 1975-02-02 Referring Provider (PT): Lisbeth Renshaw, MD   Encounter Date: 01/24/2021   PT End of Session - 01/24/21 1651     Visit Number 4    Number of Visits 8    Date for PT Re-Evaluation 02/09/21    Authorization Type medicaid united healthcare- 27 combined VL no auth for patient's over 21    Authorization - Visit Number 4    Authorization - Number of Visits 27    Progress Note Due on Visit 10    PT Start Time 1645    PT Stop Time 1730    PT Time Calculation (min) 45 min    Activity Tolerance Patient tolerated treatment well    Behavior During Therapy [facility] for tasks assessed/performed             Past Medical History:  Diagnosis Date   Allergy    Anxiety    Phreesia 07/26/2019   Dyspareunia    Hemorrhoids    HLD (hyperlipidemia)    HTN (hypertension)    Hypertension    Phreesia 07/26/2019   MRSA (methicillin resistant Staphylococcus aureus) 2010   boils thighs   S/P endoscopy June 2012   Dr. Jena Gauss: mild erosive esophagitis, chronic duodenitis, mild gastritis, stop NSAIDs.    Vaginitis     Past Surgical History:  Procedure Laterality Date   CESAREAN SECTION  2004   CESAREAN SECTION N/A    Phreesia 07/26/2019   COLONOSCOPY WITH PROPOFOL N/A 11/27/2019   Procedure: COLONOSCOPY WITH PROPOFOL;  Surgeon: Corbin Ade, MD;  [facility];  Service: Endoscopy;  Laterality: N/A;  9:00am   ESOPHAGOGASTRODUODENOSCOPY  2012   small hiatal hernia and mild erosive reflux esophagitis    There were no vitals filed for this visit.   Subjective Assessment - 01/24/21 1650     Subjective Continues to improve. No pain. Still notes occasional numbness in hands and fingers with some positions but is getting better.     Pertinent History carpal tunnel bilaterally, HTN,    Limitations Sitting;Walking;House hold activities;Standing    Patient Stated Goals have less pain/releif of numbness and tingling/pain    Currently in Pain? No/denies               01/24/21 0001  Neck Exercises: Machines for Strengthening  UBE (Upper Arm Bike) 3/3 FWD/ Back  Neck Exercises: Standing  Other Standing Exercises band rows and extension GTB x30 each, band ER 2 x 15 wiht 5 second holds, band horiz abuction GTB 2 x 15, median nerve glides  Neck Exercises: Seated  Neck Retraction 15 reps  Manual Therapy  Manual Therapy Joint mobilization  Manual therapy comments Completed separate from all other activity  Joint Mobilization Grade II-III P/A mobs thoracic spine T1-T5     PT Short Term Goals - 12/15/20 1137       PT SHORT TERM GOAL #1   Title Patient will be able to demonstrate cervical ROM without increase in neck or arm symtpoms    Time 4    Period Weeks    Status New    Target Date 01/12/21      PT SHORT TERM GOAL #2   Title Patient will report at least 50% improvement in overall symptoms and/or function to demonstrate  Panic attacks 11/22/2016   Breast calcifications on mammogram 09/11/2016   GAD (generalized anxiety disorder) 07/14/2015   Allergic rhinitis 01/22/2014   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   Hyperlipemia 07/09/2009   Overweight (BMI 25.0-29.9) 11/23/2008   Essential hypertension 09/23/2008   8:30 AM, 01/25/21 Georges Lynchameron Maghan Jessee PT DPT  Physical Therapist with [place]  [facility]  [phone]   [facility] [address] [place] , [place], [address] Phone: [phone]   Fax:  [phone]  Name: Catherine Allen MRN: [id number] Date of Birth: 12/03/1975  Panic attacks 11/22/2016   Breast calcifications on mammogram 09/11/2016   GAD (generalized anxiety disorder) 07/14/2015   Allergic rhinitis 01/22/2014   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   Hyperlipemia 07/09/2009   Overweight (BMI 25.0-29.9) 11/23/2008   Essential hypertension 09/23/2008   8:30 AM, 01/25/21 Georges Lynchameron Maghan Jessee PT DPT  Physical Therapist with [place]  [facility]  [phone]   [facility] [address] [place] , [place], [address] Phone: [phone]   Fax:  [phone]  Name: Catherine Allen MRN: [id number] Date of Birth: 12/03/1975

## 2021-01-26 ENCOUNTER — Ambulatory Visit (HOSPITAL_COMMUNITY): Payer: Medicaid Other | Admitting: Physical Therapy

## 2021-01-26 ENCOUNTER — Encounter (HOSPITAL_COMMUNITY): Payer: Self-pay | Admitting: Physical Therapy

## 2021-01-26 ENCOUNTER — Other Ambulatory Visit: Payer: Self-pay

## 2021-01-26 DIAGNOSIS — M5410 Radiculopathy, site unspecified: Secondary | ICD-10-CM

## 2021-01-26 DIAGNOSIS — M542 Cervicalgia: Secondary | ICD-10-CM | POA: Diagnosis not present

## 2021-01-26 DIAGNOSIS — R29898 Other symptoms and signs involving the musculoskeletal system: Secondary | ICD-10-CM

## 2021-01-26 NOTE — Therapy (Signed)
[facility] [address] [place], [place], [address] Phone: [phone]   Fax:  [phone]  Physical Therapy Treatment  Patient Details  Name: Catherine Allen MRN: [id number] Date of Birth: January 07, 1976 Referring Provider (PT): Lisbeth Renshaw, MD   Encounter Date: 01/26/2021   PT End of Session - 01/26/21 1611     Visit Number 5    Number of Visits 8    Date for PT Re-Evaluation 02/09/21    Authorization Type medicaid united healthcare- 27 combined VL no auth for patient's over 21    Authorization - Visit Number 5    Authorization - Number of Visits 27    Progress Note Due on Visit 10    PT Start Time 1614    PT Stop Time 1654    PT Time Calculation (min) 40 min    Activity Tolerance Patient tolerated treatment well    Behavior During Therapy [facility] for tasks assessed/performed             Past Medical History:  Diagnosis Date   Allergy    Anxiety    Phreesia 07/26/2019   Dyspareunia    Hemorrhoids    HLD (hyperlipidemia)    HTN (hypertension)    Hypertension    [employer]reesia 07/26/2019   MRSA (methicillin resistant Staphylococcus aureus) 2010   boils thighs   S/P endoscopy June 2012   Dr. Jena Gauss: mild erosive esophagitis, chronic duodenitis, mild gastritis, stop NSAIDs.    Vaginitis     Past Surgical History:  Procedure Laterality Date   CESAREAN SECTION  2004   CESAREAN SECTION N/A    Phreesia 07/26/2019   COLONOSCOPY WITH PROPOFOL N/A 11/27/2019   Procedure: COLONOSCOPY WITH PROPOFOL;  Surgeon: Corbin Ade, MD;  [facility];  Service: Endoscopy;  Laterality: N/A;  9:00am   ESOPHAGOGASTRODUODENOSCOPY  2012   small hiatal hernia and mild erosive reflux esophagitis    There were no vitals filed for this visit.   Subjective Assessment - 01/26/21 1643     Subjective States that she felt great after Sheria Lang pushed on her spine the other day. No current symptoms.    Pertinent History carpal tunnel  bilaterally, HTN,    Limitations Sitting;Walking;House hold activities;Standing    Patient Stated Goals have less pain/releif of numbness and tingling/pain    Currently in Pain? No/denies                [facility] PT Assessment - 01/26/21 0001       Assessment   Medical Diagnosis neck and shoulder pain    Referring Provider (PT) Lisbeth Renshaw, MD                           [facility] Adult PT Treatment/Exercise - 01/26/21 0001       Neck Exercises: Theraband   Shoulder Extension Blue   2 minutes     Neck Exercises: Standing   Other Standing Exercises swimmers with back up against the wall, 2x10 5" holds in W position      Neck Exercises: Supine   Canalith Repositioning Limitations 1/2 foam roll down spine 2 mintues then shoulder flexion x10, then retraction x20, bear hug rocking on 1/2 foam 2 minutes; 1/2 foam perpendiclar to spine 3 levels up to 1 minute at each spot      Neck Exercises: Stretches   Other Neck Stretches cat cow x`15 PT assis    Other  Neck Stretches child's pose x5 10" holds; thread the needle 2x5 5" holds Bilat      Manual Therapy   Manual Therapy Joint mobilization    Manual therapy comments Completed separate from all other activity    Joint Mobilization Grade II-III P/A mobs thoracic spine T1-T5                       PT Short Term Goals - 12/15/20 1137       PT SHORT TERM GOAL #1   Title Patient will be able to demonstrate cervical ROM without increase in neck or arm symtpoms    Time 4    Period Weeks    Status New    Target Date 01/12/21      PT SHORT TERM GOAL #2   Title Patient will report at least 50% improvement in overall symptoms and/or function to demonstrate improved functional mobility    Time 4    Period Weeks    Status New    Target Date 01/12/21      PT SHORT TERM GOAL #3   Title Patient will be independent in self management strategies to improve quality of life and functional outcomes.    Time 4     Period Weeks    Status New    Target Date 01/12/21               PT Long Term Goals - 12/15/20 1138       PT LONG TERM GOAL #1   Title Patient will report at least 75% improvement in overall symptoms and/or function to demonstrate improved functional mobility    Time 8    Period Weeks    Status New    Target Date 02/09/21      PT LONG TERM GOAL #2   Title Patient will report no radicular symptoms in left arm    Time 8    Period Weeks    Status New    Target Date 02/09/21      PT LONG TERM GOAL #3   Title Patient will be able to demonstrate good sitting and standing posture without increase in symptoms    Time 8    Period Weeks    Status New    Target Date 02/09/21                   Plan - 01/26/21 1648     Clinical Impression Statement Continued with thoracic mobility today which was tolerated well. Advanced band exercises to blue TheraBand. Not pain noted during session. Added thread the needle and childs pose to HEP. Will continue with current POC.    Personal Factors and Comorbidities Fitness    Examination-Activity Limitations Sleep;Sit;Lift;Carry    Examination-Participation Restrictions Occupation;Driving;Shop;Cleaning    Stability/Clinical Decision Making Stable/Uncomplicated    Rehab Potential Good    PT Frequency Other (comment)   1-2x/week for total of 8 visits over 8 week certification   PT Duration 8 weeks    PT Treatment/Interventions ADLs/Self Care Home Management;Cryotherapy;Electrical Stimulation;Moist Heat;Traction;Therapeutic exercise;Manual techniques;Therapeutic activities;Neuromuscular re-education;Patient/family education;Dry needling;Joint Manipulations;Spinal Manipulations    PT Next Visit Plan Progress posture strengthening as tolerated    PT Home Exercise Plan chin tuck 1/5 pec stretch, scap retraction, median nerve glide 1/9 band row, extension, shoulder ER 1/17 t spine extension in chair, shoulder horiz abduction    Consulted  and Agree with Plan of Care Patient  Patient will benefit from skilled therapeutic intervention in order to improve the following deficits and impairments:  Pain, Decreased range of motion, Decreased strength, Decreased activity tolerance, Impaired UE functional use, Postural dysfunction, Improper body mechanics  Visit Diagnosis: Cervicalgia  Decreased range of motion of neck  Radicular pain of left lower extremity     Problem List Patient Active Problem List   Diagnosis Date Noted   IFG (impaired fasting glucose) 08/24/2020   Neck pain on right side 07/02/2020   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 07/02/2020   Allergic dermatitis 10/17/2019   GERD (gastroesophageal reflux disease) 04/07/2019   Bunion of great toe of right foot 11/22/2017   Decreased vision in both eyes 06/08/2017   Panic attacks 11/22/2016   Breast calcifications on mammogram 09/11/2016   GAD (generalized anxiety disorder) 07/14/2015   Allergic rhinitis 01/22/2014   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 [place], [place], [address] Phone: [phone]   Fax:  [phone]  Name: Catherine Allen MRN: [id number] Date of Birth: [birth year]

## 2021-01-30 IMAGING — RF DG ESOPHAGUS
7 series · 12 of 24 positions shown · non-contrast
Comparison: None

CLINICAL DATA: Dysphagia, history hiatal hernia an acid reflux

EXAM:
ESOPHOGRAM / BARIUM SWALLOW / BARIUM TABLET STUDY
TECHNIQUE: Combined double contrast and single contrast examination performed
using effervescent crystals, thick barium liquid, and thin barium
liquid. The patient was observed with fluoroscopy swallowing a 13 mm
barium sulphate tablet.
FLUOROSCOPY TIME:  Fluoroscopy Time:  1 minutes 6 seconds
Radiation Exposure Index (if provided by the fluoroscopic device):
14.4 mGy
Number of Acquired Spot Images: multiple fluoroscopic screen
captures

[Series 1: cp_standard · 0.18mm/px · 1 of 76 frames shown (1 of 7)]
[frame 39/76]
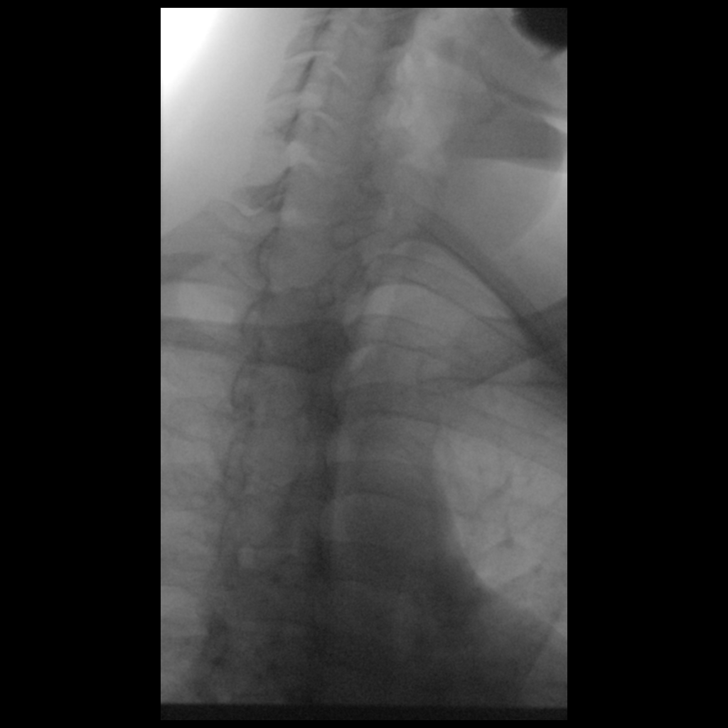

[Series 2: cp_standard · 0.19mm/px · 2 of 70 frames shown (2 of 7)]
[frame 8/70]
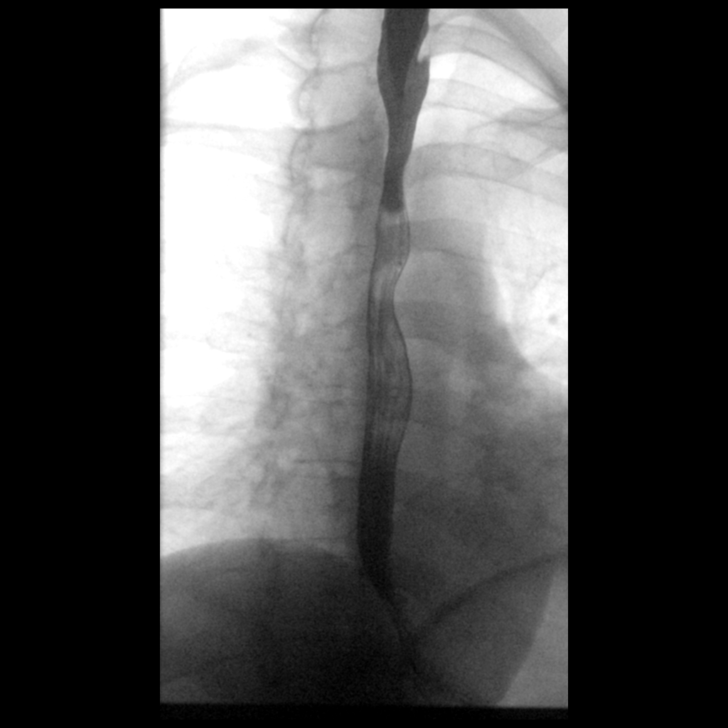
[frame 36/70]
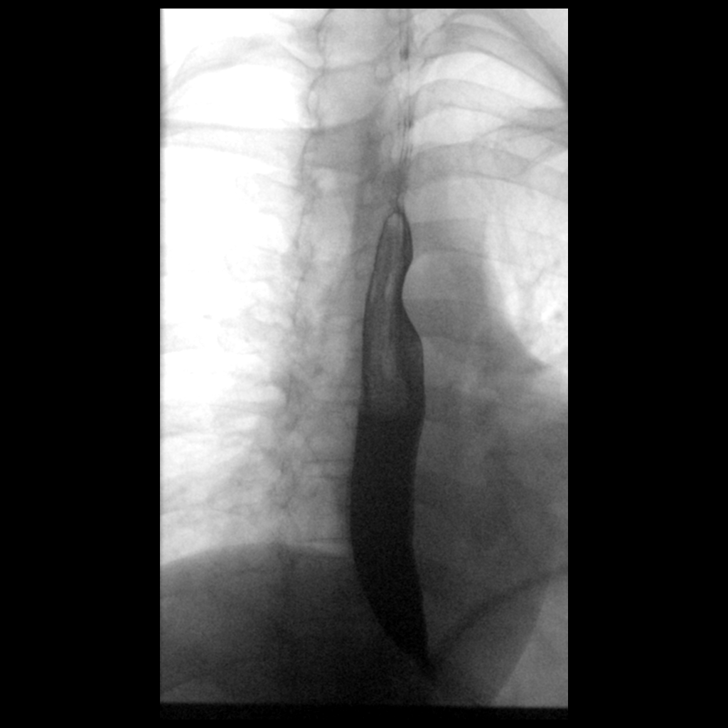

[Series 3: cp_standard · 0.19mm/px · 2 of 55 frames shown (3 of 7)]
[frame 1/55]
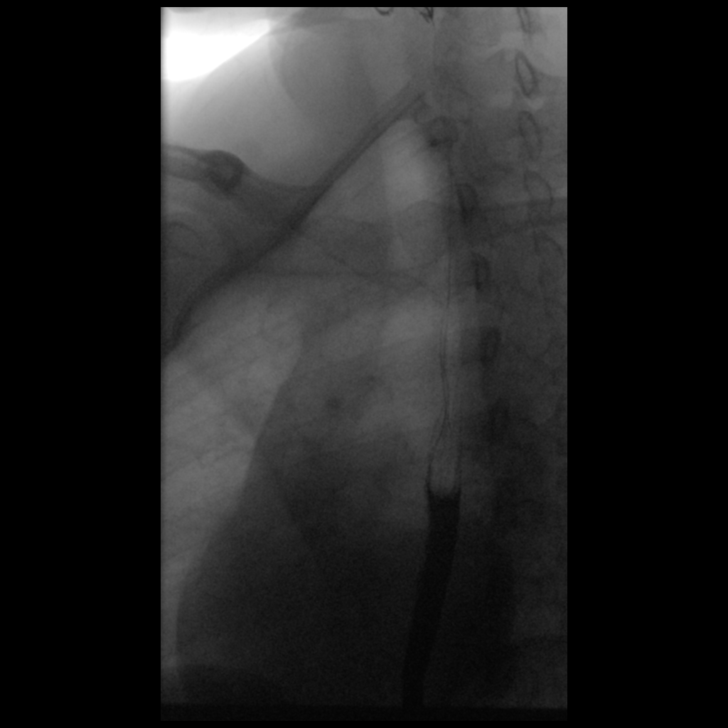
[frame 47/55]
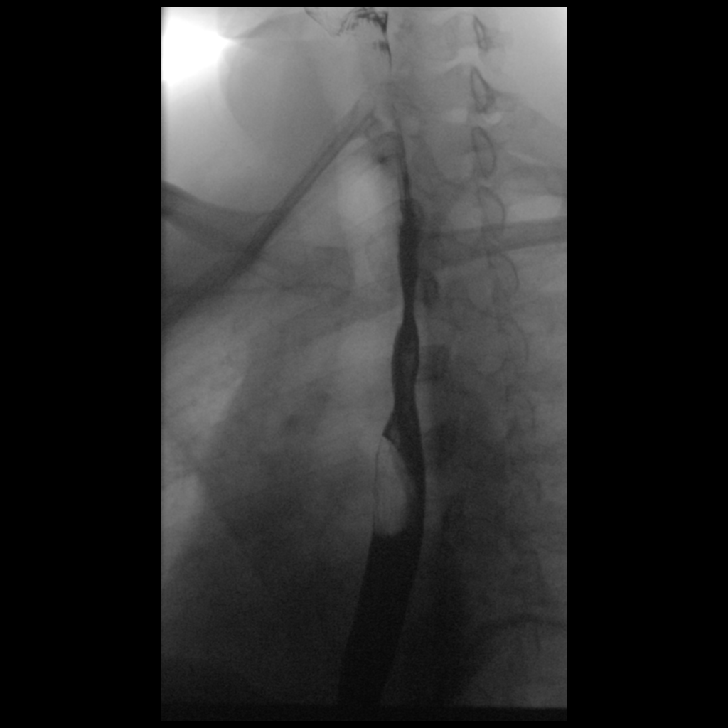

[Series 4: cp_standard · 0.19mm/px · 2 of 83 frames shown (4 of 7)]
[frame 24/83]
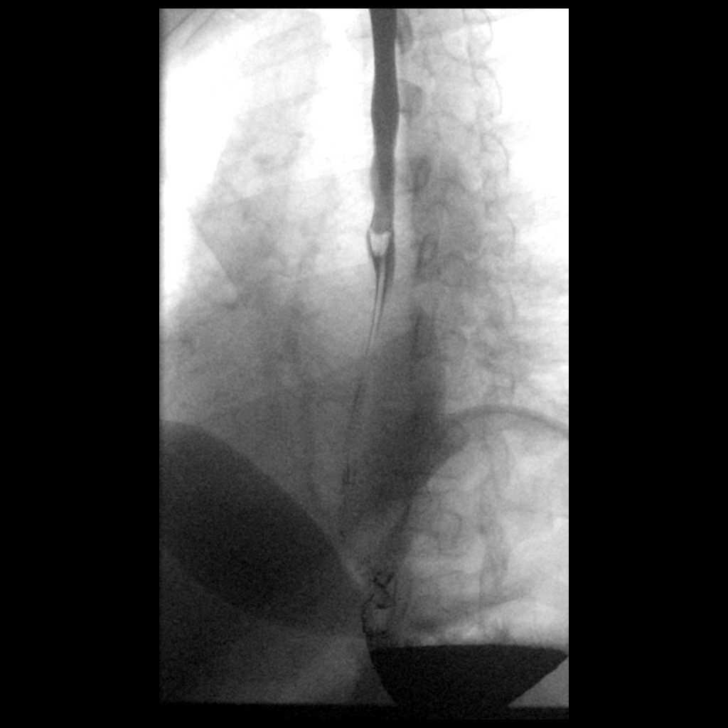
[frame 71/83]
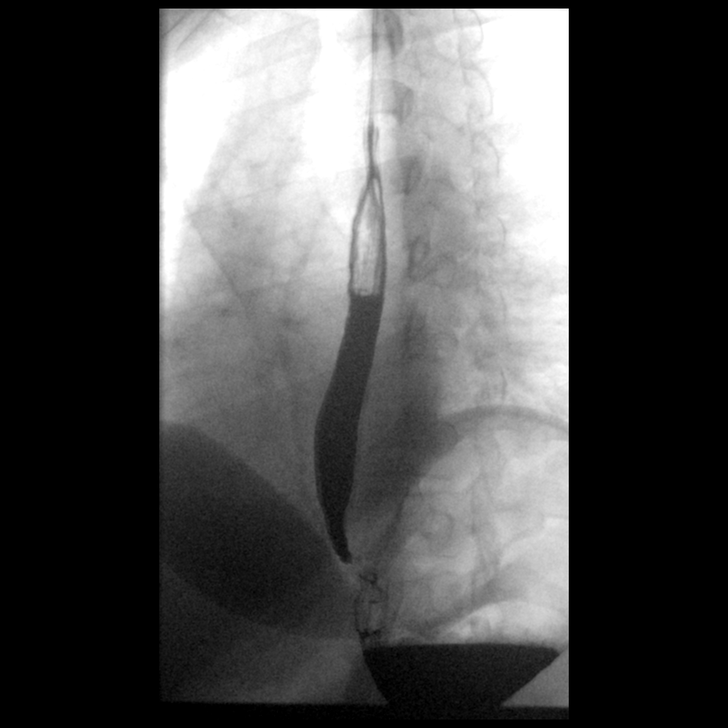

[Series 5: cp_standard · 0.29mm/px · 1 of 124 frames shown (5 of 7)]
[frame 63/124]
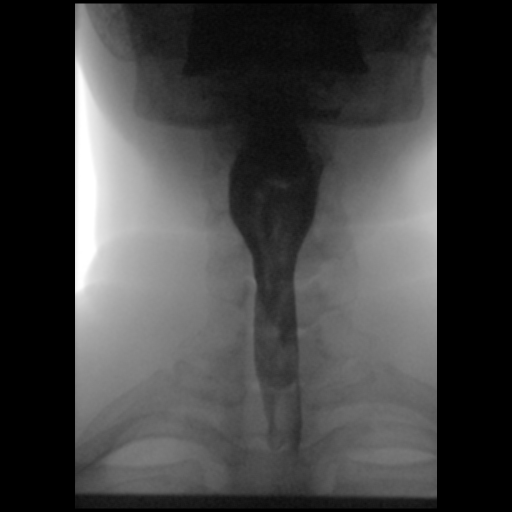

[Series 6: cp_standard · 0.27mm/px · 2 of 86 frames shown (6 of 7)]
[frame 13/86]
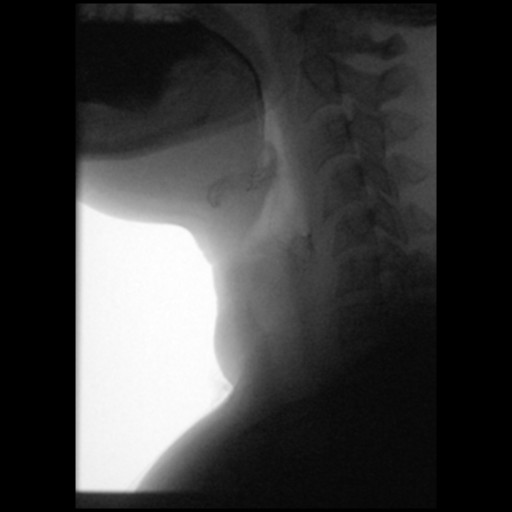
[frame 44/86]
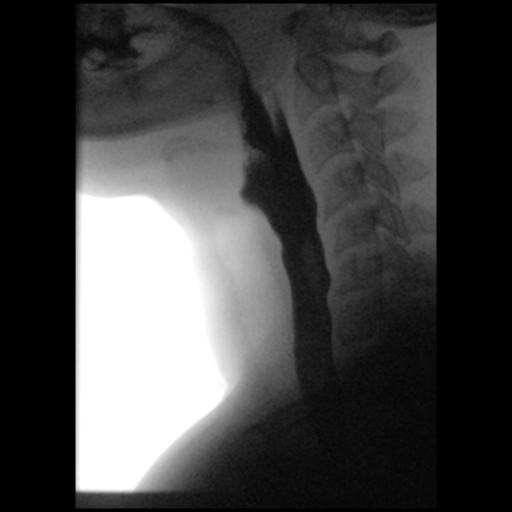

[Series 7: cp_standard · 0.19mm/px · 2 of 137 frames shown (7 of 7)]
[frame 69/137]
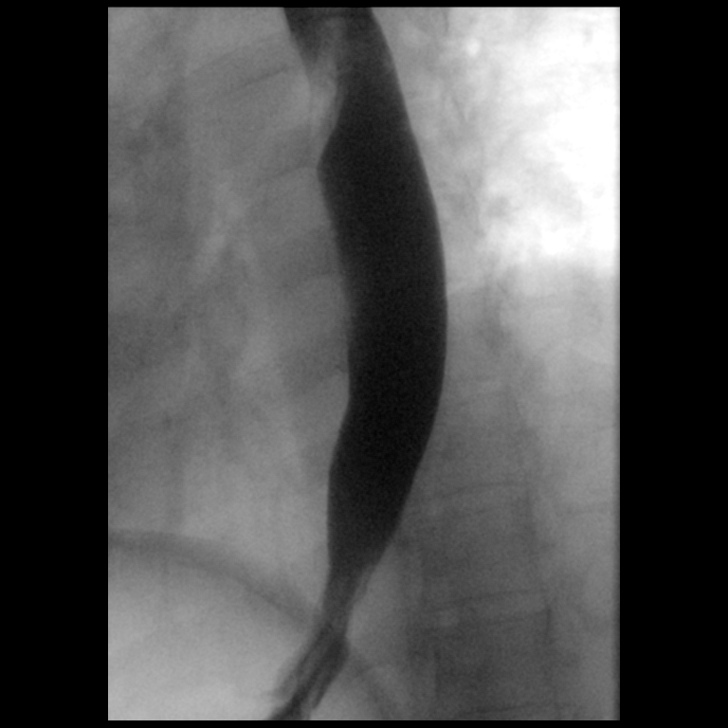
[frame 129/137]
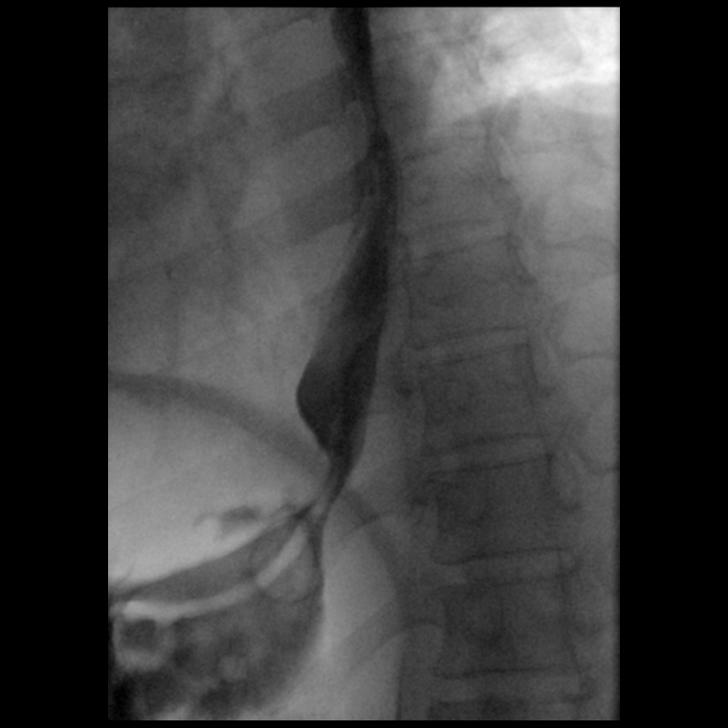

[12 of 24 positions shown; findings below may reference images not displayed]

FINDINGS: Esophageal distention: Normal distention without mass or stricture

Filling defects:  None

12.5 mm barium tablet: Easily passed from oral cavity to stomach
without obstruction

Motility:  Normal

Mucosa:  Smooth without irregularity or ulceration

Hypopharynx/cervical esophagus: Normal motion. No laryngeal
penetration, aspiration, or residuals

Hiatal hernia:  Absent

GE reflux:  Not witnessed during exam

Other:  N/A
IMPRESSION: Normal esophagram.

## 2021-01-31 ENCOUNTER — Other Ambulatory Visit: Payer: Self-pay

## 2021-01-31 ENCOUNTER — Ambulatory Visit (HOSPITAL_COMMUNITY): Payer: Medicaid Other | Admitting: Physical Therapy

## 2021-01-31 DIAGNOSIS — M5410 Radiculopathy, site unspecified: Secondary | ICD-10-CM

## 2021-01-31 DIAGNOSIS — M542 Cervicalgia: Secondary | ICD-10-CM | POA: Diagnosis not present

## 2021-01-31 DIAGNOSIS — R29898 Other symptoms and signs involving the musculoskeletal system: Secondary | ICD-10-CM

## 2021-01-31 NOTE — Therapy (Signed)
[facility] [address] [place], [place], [address] Phone: [phone]   Fax:  [phone]  Physical Therapy Treatment  Patient Details  Name: Catherine Allen MRN: [id number] Date of Birth: Jun 22, 1975 Referring Provider (PT): Lisbeth Renshaw, MD   Encounter Date: 01/31/2021   PT End of Session - 01/31/21 1645     Visit Number 6    Number of Visits 8    Date for PT Re-Evaluation 02/09/21    Authorization Type medicaid united healthcare- 27 combined VL no auth for patient's over 21    Authorization - Visit Number 6    Authorization - Number of Visits 27    Progress Note Due on Visit 10    PT Start Time 1643    PT Stop Time 1722    PT Time Calculation (min) 39 min    Activity Tolerance Patient tolerated treatment well    Behavior During Therapy [facility] for tasks assessed/performed             Past Medical History:  Diagnosis Date   Allergy    Anxiety    [employer]reesia 07/26/2019   Dyspareunia    Hemorrhoids    HLD (hyperlipidemia)    HTN (hypertension)    Hypertension    Phreesia 07/26/2019   MRSA (methicillin resistant Staphylococcus aureus) 2010   boils thighs   S/P endoscopy June 2012   Dr. Jena Gauss: mild erosive esophagitis, chronic duodenitis, mild gastritis, stop NSAIDs.    Vaginitis     Past Surgical History:  Procedure Laterality Date   CESAREAN SECTION  2004   CESAREAN SECTION N/A    [employer]reesia 07/26/2019   COLONOSCOPY WITH PROPOFOL N/A 11/27/2019   Procedure: COLONOSCOPY WITH PROPOFOL;  Surgeon: Corbin Ade, MD;  [facility];  Service: Endoscopy;  Laterality: N/A;  9:00am   ESOPHAGOGASTRODUODENOSCOPY  2012   small hiatal hernia and mild erosive reflux esophagitis    There were no vitals filed for this visit.   Subjective Assessment - 01/31/21 1645     Subjective Doing well, ongoing numbness in LT arm and hand but not as frequent. No pain today.    Pertinent History carpal tunnel bilaterally,  HTN,    Limitations Sitting;Walking;House hold activities;Standing    Patient Stated Goals have less pain/releif of numbness and tingling/pain    Currently in Pain? No/denies                               [facility] Adult PT Treatment/Exercise - 01/31/21 0001       Neck Exercises: Machines for Strengthening   UBE (Upper Arm Bike) 3/3 FWD/ Back      Neck Exercises: Standing   Other Standing Exercises band rows/ extension BTB 2 x 10 each, shoulder ER/ horiz abduction BTB 2 x 10, diagonals BTB 2 x 10, wall push ups 2 x 10      Neck Exercises: Seated   Other Seated Exercise seated thoracic extension with foam roll 10 x 3"      Neck Exercises: Stretches   Other Neck Stretches pec stretch in doorway 3 x 20" each arm      Manual Therapy   Manual Therapy Joint mobilization    Manual therapy comments Completed separate from all other activity    Joint Mobilization Grade II-III P/A mobs thoracic spine T1-T5  PT Short Term Goals - 12/15/20 1137       PT SHORT TERM GOAL #1   Title Patient will be able to demonstrate cervical ROM without increase in neck or arm symtpoms    Time 4    Period Weeks    Status New    Target Date 01/12/21      PT SHORT TERM GOAL #2   Title Patient will report at least 50% improvement in overall symptoms and/or function to demonstrate improved functional mobility    Time 4    Period Weeks    Status New    Target Date 01/12/21      PT SHORT TERM GOAL #3   Title Patient will be independent in self management strategies to improve quality of life and functional outcomes.    Time 4    Period Weeks    Status New    Target Date 01/12/21               PT Long Term Goals - 12/15/20 1138       PT LONG TERM GOAL #1   Title Patient will report at least 75% improvement in overall symptoms and/or function to demonstrate improved functional mobility    Time 8    Period Weeks    Status New    Target  Date 02/09/21      PT LONG TERM GOAL #2   Title Patient will report no radicular symptoms in left arm    Time 8    Period Weeks    Status New    Target Date 02/09/21      PT LONG TERM GOAL #3   Title Patient will be able to demonstrate good sitting and standing posture without increase in symptoms    Time 8    Period Weeks    Status New    Target Date 02/09/21                   Plan - 01/31/21 1709     Clinical Impression Statement Patient progressing well. Showing good return with previous ther ex. Added diagonal band pull apart for scapular strength progression. Also added pec stretch in doorway for posturing. Slight postural limitations remain, but improving thoracic mobility. Patient will continue to benefit from skilled therapy services to reduce deficits and improve functional ability.    Personal Factors and Comorbidities Fitness    Examination-Activity Limitations Sleep;Sit;Lift;Carry    Examination-Participation Restrictions Occupation;Driving;Shop;Cleaning    Stability/Clinical Decision Making Stable/Uncomplicated    Rehab Potential Good    PT Frequency Other (comment)   1-2x/week for total of 8 visits over 8 week certification   PT Duration 8 weeks    PT Treatment/Interventions ADLs/Self Care Home Management;Cryotherapy;Electrical Stimulation;Moist Heat;Traction;Therapeutic exercise;Manual techniques;Therapeutic activities;Neuromuscular re-education;Patient/family education;Dry needling;Joint Manipulations;Spinal Manipulations    PT Next Visit Plan Progress posture strengthening as tolerated    PT Home Exercise Plan chin tuck 1/5 pec stretch, scap retraction, median nerve glide 1/9 band row, extension, shoulder ER 1/17 t spine extension in chair, shoulder horiz abduction 1/23 diagonals and wall push up    Consulted and Agree with Plan of Care Patient             Patient will benefit from skilled therapeutic intervention in order to improve the following  deficits and impairments:  Pain, Decreased range of motion, Decreased strength, Decreased activity tolerance, Impaired UE functional use, Postural dysfunction, Improper body mechanics  Visit Diagnosis: Cervicalgia  Decreased range of motion  of neck  Radicular pain of left lower extremity     Problem List Patient Active Problem List   Diagnosis Date Noted   IFG (impaired fasting glucose) 08/24/2020   Neck pain on right side 07/02/2020   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 07/02/2020   Allergic dermatitis 10/17/2019   GERD (gastroesophageal reflux disease) 04/07/2019   Bunion of great toe of right foot 11/22/2017   Decreased vision in both eyes 06/08/2017   Panic attacks 11/22/2016   Breast calcifications on mammogram 09/11/2016   GAD (generalized anxiety disorder) 07/14/2015   Allergic rhinitis 01/22/2014   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

## 2021-02-02 ENCOUNTER — Encounter (HOSPITAL_COMMUNITY): Payer: Self-pay | Admitting: Physical Therapy

## 2021-02-02 ENCOUNTER — Other Ambulatory Visit: Payer: Self-pay

## 2021-02-02 ENCOUNTER — Ambulatory Visit (HOSPITAL_COMMUNITY): Payer: Medicaid Other | Admitting: Physical Therapy

## 2021-02-02 DIAGNOSIS — M5410 Radiculopathy, site unspecified: Secondary | ICD-10-CM

## 2021-02-02 DIAGNOSIS — M542 Cervicalgia: Secondary | ICD-10-CM

## 2021-02-02 DIAGNOSIS — R29898 Other symptoms and signs involving the musculoskeletal system: Secondary | ICD-10-CM

## 2021-02-02 NOTE — Therapy (Signed)
[facility] [address] [place], [place], [address] Phone: [phone]   Fax:  [phone]  Physical Therapy Treatment  Patient Details  Name: Catherine Allen MRN: [id number] Date of Birth: [birth year] Referring Provider (PT): Consuella Lose, MD  PHYSICAL THERAPY DISCHARGE SUMMARY  Visits from Start of Care: 7  Current functional level related to goals / functional outcomes: See below   Remaining deficits: See below   Education / Equipment: See assessment    Patient agrees to discharge. Patient goals were met. Patient is being discharged due to meeting the stated rehab goals.  Encounter Date: 02/02/2021   PT End of Session - 02/02/21 1615     Visit Number 7    Number of Visits 8    Date for PT Re-Evaluation 02/09/21    Authorization Type medicaid united healthcare- 27 combined VL no auth for patient's over 21    Authorization - Visit Number 7    Authorization - Number of Visits 27    Progress Note Due on Visit 10    PT Start Time 1103    PT Stop Time 1655    PT Time Calculation (min) 40 min    Activity Tolerance Patient tolerated treatment well    Behavior During Therapy [facility] for tasks assessed/performed             Past Medical History:  Diagnosis Date   Allergy    Anxiety    [employer]rees[employer] 07/26/2019   Dyspareunia    Hemorrhoids    HLD (hyperlipidemia)    HTN (hypertension)    Hypertension    [employer]reesia 07/26/2019   MRSA (methicillin resistant Staphylococcus aureus) 2010   boils thighs   S/P endoscopy June 2012   Dr. Gala Romney: mild erosive esophagitis, chronic duodenitis, mild gastritis, stop NSAIDs.    Vaginitis     Past Surgical History:  Procedure Laterality Date   CESAREAN SECTION  2004   CESAREAN SECTION N/A    Phreesia 07/26/2019   COLONOSCOPY WITH PROPOFOL N/A 11/27/2019   Procedure: COLONOSCOPY WITH PROPOFOL;  Surgeon: Daneil Dolin, MD;  Location: [facility];  Service: Endoscopy;  Laterality:  N/A;  9:00am   ESOPHAGOGASTRODUODENOSCOPY  2012   small hiatal hernia and mild erosive reflux esophagitis    There were no vitals filed for this visit.   Subjective Assessment - 02/02/21 1621     Subjective Patient is doing much better. Her postural awareness is much better. She still has numbness and tingling in occasionally but 75% improved. Pain is 100 % improved.    Pertinent History carpal tunnel bilaterally, HTN,    Limitations Sitting;Walking;House hold activities;Standing    Patient Stated Goals have less pain/releif of numbness and tingling/pain    Currently in Pain? No/denies                [facility] PT Assessment - 02/02/21 0001       Assessment   Medical Diagnosis neck and shoulder pain    Referring Provider (PT) Consuella Lose, MD    Prior Therapy no      Precautions   Precautions None      Restrictions   Weight Bearing Restrictions No      Prior Function   Level of Independence Independent      Cognition   Overall Cognitive Status Within Functional Limits for tasks assessed      Observation/Other Assessments   Focus on Therapeutic Outcomes (FOTO)  --  AROM   Cervical Flexion 49    Cervical Extension 60    Cervical - Right Side Bend 40    Cervical - Left Side Bend 38    Cervical - Right Rotation 68    Cervical - Left Rotation 70      Strength   Strength Assessment Site Shoulder    Right/Left Shoulder Right;Left    Right Shoulder Flexion 5/5    Right Shoulder ABduction 5/5    Right Shoulder External Rotation 5/5    Left Shoulder Flexion 5/5    Left Shoulder ABduction 5/5    Left Shoulder External Rotation 5/5                           [facility] Adult PT Treatment/Exercise - 02/02/21 0001       Neck Exercises: Machines for Strengthening   UBE (Upper Arm Bike) 3/3 FWD/ Back      Neck Exercises: Standing   Other Standing Exercises band rows/ extension BTB 2 x 10 each, shoulder ER/ horiz abduction BTB 2 x 10, diagonals BTB  2 x 10, wall push ups 2 x 10, median nerve glides swimmers x10, tray carry x10    Other Standing Exercises wall angels 2x10      Neck Exercises: Seated   Other Seated Exercise seated thoracic extension with foam roll 15 x 3"      Neck Exercises: Stretches   Other Neck Stretches pec stretch in doorway 3 x 20" each arm                       PT Short Term Goals - 02/02/21 1646       PT SHORT TERM GOAL #1   Title Patient will be able to demonstrate cervical ROM without increase in neck or arm symtpoms    Time 4    Period Weeks    Status Achieved    Target Date 01/12/21      PT SHORT TERM GOAL #2   Title Patient will report at least 50% improvement in overall symptoms and/or function to demonstrate improved functional mobility    Baseline Reports 75%-100%    Time 4    Period Weeks    Status Achieved    Target Date 01/12/21      PT SHORT TERM GOAL #3   Title Patient will be independent in self management strategies to improve quality of life and functional outcomes.    Baseline Demos good return    Time 4    Period Weeks    Status Achieved    Target Date 01/12/21               PT Long Term Goals - 02/02/21 1647       PT LONG TERM GOAL #1   Title Patient will report at least 75% improvement in overall symptoms and/or function to demonstrate improved functional mobility    Baseline Reports 75-100%    Time 8    Period Weeks    Status Achieved    Target Date 02/09/21      PT LONG TERM GOAL #2   Title Patient will report no radicular symptoms in left arm    Baseline Reports 75% improved overall and no symptoms ar present    Time 8    Period Weeks    Status Achieved    Target Date 02/09/21      PT LONG TERM GOAL #  3   Title Patient will be able to demonstrate good sitting and standing posture without increase in symptoms    Time 8    Period Weeks    Status Achieved    Target Date 02/09/21                   Plan - 02/02/21 1659      Clinical Impression Statement Performed reassessment today. Patient has made very good progress and has currently met all therapy goals. Patient showing improved ROM, strength and postural awareness. No reported radicular symptoms at rest or without provocation. Patient being DC today with all goals met. Reviewed HEP and issued handout. Answered all patient questions and encouraged patient to follow up with therapy services with any further questions or concerns.    Personal Factors and Comorbidities Fitness    Examination-Activity Limitations Sleep;Sit;Lift;Carry    Examination-Participation Restrictions Occupation;Driving;Shop;Cleaning    Stability/Clinical Decision Making Stable/Uncomplicated    Rehab Potential Good    PT Treatment/Interventions ADLs/Self Care Home Management;Cryotherapy;Electrical Stimulation;Moist Heat;Traction;Therapeutic exercise;Manual techniques;Therapeutic activities;Neuromuscular re-education;Patient/family education;Dry needling;Joint Manipulations;Spinal Manipulations    PT Next Visit Plan DC to HEP    PT Home Exercise Plan chin tuck 1/5 pec stretch, scap retraction, median nerve glide 1/9 band row, extension, shoulder ER 1/17 t spine extension in chair, shoulder horiz abduction 1/23 diagonals and wall push up    Consulted and Agree with Plan of Care Patient             Patient will benefit from skilled therapeutic intervention in order to improve the following deficits and impairments:  Pain, Decreased range of motion, Decreased strength, Decreased activity tolerance, Impaired UE functional use, Postural dysfunction, Improper body mechanics  Visit Diagnosis: Cervicalgia  Decreased range of motion of neck  Radicular pain of left lower extremity     Problem List Patient Active Problem List   Diagnosis Date Noted   IFG (impaired fasting glucose) 08/24/2020   Neck pain on right side 07/02/2020   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  07/02/2020   Allergic dermatitis 10/17/2019   GERD (gastroesophageal reflux disease) 04/07/2019   Bunion of great toe of right foot 11/22/2017   Decreased vision in both eyes 06/08/2017   Panic attacks 11/22/2016   Breast calcifications on mammogram 09/11/2016   GAD (generalized anxiety disorder) 07/14/2015   Allergic rhinitis 01/22/2014   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 [place], [place], [address] Phone: [phone]   Fax:  [phone]  Name: Catherine Allen MRN: [id number] Date of Birth: 18-Dec-1975

## 2021-02-07 ENCOUNTER — Ambulatory Visit (HOSPITAL_COMMUNITY): Payer: Medicaid Other | Admitting: Physical Therapy

## 2021-02-08 ENCOUNTER — Encounter: Payer: Self-pay | Admitting: Family Medicine

## 2021-02-08 NOTE — Telephone Encounter (Signed)
Appt scheduled

## 2021-02-09 ENCOUNTER — Encounter (HOSPITAL_COMMUNITY): Payer: Medicaid Other | Admitting: Physical Therapy

## 2021-02-10 ENCOUNTER — Other Ambulatory Visit: Payer: Self-pay

## 2021-02-10 ENCOUNTER — Ambulatory Visit (INDEPENDENT_AMBULATORY_CARE_PROVIDER_SITE_OTHER): Payer: Medicaid Other | Admitting: Family Medicine

## 2021-02-10 ENCOUNTER — Encounter: Payer: Self-pay | Admitting: Family Medicine

## 2021-02-10 VITALS — BP 138/82 | HR 99 | Resp 16 | Ht 63.0 in | Wt 174.0 lb

## 2021-02-10 DIAGNOSIS — I10 Essential (primary) hypertension: Secondary | ICD-10-CM | POA: Diagnosis not present

## 2021-02-10 DIAGNOSIS — F411 Generalized anxiety disorder: Secondary | ICD-10-CM | POA: Diagnosis not present

## 2021-02-10 DIAGNOSIS — E669 Obesity, unspecified: Secondary | ICD-10-CM | POA: Diagnosis not present

## 2021-02-10 DIAGNOSIS — F410 Panic disorder [episodic paroxysmal anxiety] without agoraphobia: Secondary | ICD-10-CM

## 2021-02-10 MED ORDER — LORAZEPAM 1 MG PO TABS: ORAL_TABLET | ORAL | 0 refills | Status: DC

## 2021-02-10 MED ORDER — BUSPIRONE HCL 5 MG PO TABS: 5.0000 mg | ORAL_TABLET | Freq: Three times a day (TID) | ORAL | 1 refills | Status: DC

## 2021-02-10 NOTE — Progress Notes (Signed)
° °  Catherine Allen     MRN: [id number]      DOB: 1975/06/23   HPI Catherine Allen is here with a 6 week h/o frontal headache and jittery feeling at 6 pM every day for the past 3 weeks , prior to that approx 3 tinmes per week Increased panic attackes, needs  kore medication to calm her and reduce rate of panic attacks. No new stressors, coping well she feels with her Mother's illness  ROS Denies recent fever or chills. Denies sinus pressure, nasal congestion, ear pain or sore throat. Denies chest congestion, productive cough or wheezing. Denies chest pains, palpitations and leg swelling Denies abdominal pain, nausea, vomiting,diarrhea or constipation.   Denies dysuria, frequency, hesitancy or incontinence. Denies joint pain, swelling and limitation in mobility. Denies skin break down or rash.   PE  BP 138/82    Pulse 99    Resp 16    Ht 5\' 3"  (1.6 m)    Wt 174 lb (78.9 kg)    SpO2 98%    BMI 30.82 kg/m   Patient alert and oriented and in no cardiopulmonary distress.  HEENT: No facial asymmetry, EOMI,     Neck supple .  Chest: Clear to auscultation bilaterally.  CVS: S1, S2 no murmurs, no S3.Regular rate.  ABD: Soft non tender.   Ext: No edema  MS: Adequate ROM spine, shoulders, hips and knees.  Skin: Intact, no ulcerations or rash noted.  Psych: Good eye contact, anxious. Memory intact  CNS: CN 2-12 intact, power,  normal throughout.no focal deficits noted.   Assessment & Plan  Panic attacks Increased frequency, increase dose   Of ativan, pt also encouraged to utilize support from hospice  GAD (generalized anxiety disorder) Uncontrolled, increase dose of buspar, therapyoffered , already has access  Essential hypertension DASH diet and commitment to daily physical activity for a minimum of 30 minutes discussed and encouraged, as a part of hypertension management. The importance of attaining a healthy weight is also discussed.  BP/Weight 02/10/2021 11/26/2020 08/24/2020  07/23/2020 06/29/2020 04/05/2020 [id number]  Systolic BP 0000000 [id number] 0000000 [id number] [id number] -  Diastolic BP 82 78 88 86 80 82 -  Wt. (Lbs) 174 174.04 176.12 174 173 175 -  BMI 30.82 30.83 30.23 29.87 29.7 32.01 31.35     Controlled, no change in medication   Obesity (BMI 30.0-34.9)  Patient re-educated about  the importance of commitment to a  minimum of 150 minutes of exercise per week as able.  The importance of healthy food choices with portion control discussed, as well as eating regularly and within a 12 hour window most days. The need to choose "clean , green" food 50 to 75% of the time is discussed, as well as to make water the primary drink and set a goal of 64 ounces water daily.    Weight /BMI 02/10/2021 11/26/2020 08/24/2020  WEIGHT 174 lb 174 lb 0.6 oz 176 lb 1.9 oz  HEIGHT 5\' 3"  5\' 3"  5\' 4"   BMI 30.82 kg/m2 30.83 kg/m2 30.23 kg/m2

## 2021-02-10 NOTE — Patient Instructions (Signed)
F/U as before, call if you need me sooner  Increase Buspar to 3 times daily  I have increased ativan frequency, your anxiety is uncontrolled and you report panic attacks  Please reach out to support team with hospice ,this will help as you go through your Mom's illness, and will help with some of your anxiety  It is important that you exercise regularly at least 30 minutes 5 times a week. If you develop chest pain, have severe difficulty breathing, or feel very tired, stop exercising immediately and seek medical attention  Regular exercise is good for stress relief and anxiety

## 2021-02-11 ENCOUNTER — Other Ambulatory Visit: Payer: Self-pay | Admitting: Family Medicine

## 2021-02-13 ENCOUNTER — Encounter: Payer: Self-pay | Admitting: Family Medicine

## 2021-02-13 NOTE — Assessment & Plan Note (Signed)
DASH diet and commitment to daily physical activity for a minimum of 30 minutes discussed and encouraged, as a part of hypertension management. The importance of attaining a healthy weight is also discussed.  BP/Weight 02/10/2021 11/26/2020 08/24/2020 07/23/2020 06/29/2020 04/05/2020 02/24/2020  Systolic BP 138 134 138 140 135 134 -  Diastolic BP 82 78 88 86 80 82 -  Wt. (Lbs) 174 174.04 176.12 174 173 175 -  BMI 30.82 30.83 30.23 29.87 29.7 32.01 31.35     Controlled, no change in medication

## 2021-02-13 NOTE — Assessment & Plan Note (Signed)
Uncontrolled, increase dose of buspar, therapyoffered , already has access

## 2021-02-13 NOTE — Assessment & Plan Note (Signed)
°  Patient re-educated about  the importance of commitment to a  minimum of 150 minutes of exercise per week as able.  The importance of healthy food choices with portion control discussed, as well as eating regularly and within a 12 hour window most days. The need to choose "clean , green" food 50 to 75% of the time is discussed, as well as to make water the primary drink and set a goal of 64 ounces water daily.    Weight /BMI 02/10/2021 11/26/2020 08/24/2020  WEIGHT 174 lb 174 lb 0.6 oz 176 lb 1.9 oz  HEIGHT 5\' 3"  5\' 3"  5\' 4"   BMI 30.82 kg/m2 30.83 kg/m2 30.23 kg/m2

## 2021-02-13 NOTE — Assessment & Plan Note (Signed)
Increased frequency, increase dose   Of ativan, pt also encouraged to utilize support from hospice

## 2021-02-18 ENCOUNTER — Other Ambulatory Visit: Payer: Self-pay | Admitting: Family Medicine

## 2021-03-03 ENCOUNTER — Ambulatory Visit (INDEPENDENT_AMBULATORY_CARE_PROVIDER_SITE_OTHER): Payer: Medicaid Other | Admitting: Family Medicine

## 2021-03-03 ENCOUNTER — Other Ambulatory Visit: Payer: Self-pay

## 2021-03-03 ENCOUNTER — Encounter: Payer: Self-pay | Admitting: Family Medicine

## 2021-03-03 VITALS — BP 126/83 | HR 92 | Ht 63.0 in | Wt 174.0 lb

## 2021-03-03 DIAGNOSIS — F411 Generalized anxiety disorder: Secondary | ICD-10-CM

## 2021-03-03 DIAGNOSIS — F410 Panic disorder [episodic paroxysmal anxiety] without agoraphobia: Secondary | ICD-10-CM | POA: Diagnosis not present

## 2021-03-03 DIAGNOSIS — Z0000 Encounter for general adult medical examination without abnormal findings: Secondary | ICD-10-CM

## 2021-03-03 DIAGNOSIS — I10 Essential (primary) hypertension: Secondary | ICD-10-CM | POA: Diagnosis not present

## 2021-03-03 DIAGNOSIS — E782 Mixed hyperlipidemia: Secondary | ICD-10-CM

## 2021-03-03 DIAGNOSIS — E559 Vitamin D deficiency, unspecified: Secondary | ICD-10-CM

## 2021-03-03 DIAGNOSIS — H543 Unqualified visual loss, both eyes: Secondary | ICD-10-CM

## 2021-03-03 DIAGNOSIS — M542 Cervicalgia: Secondary | ICD-10-CM | POA: Diagnosis not present

## 2021-03-03 MED ORDER — BUSPIRONE HCL 7.5 MG PO TABS: 7.5000 mg | ORAL_TABLET | Freq: Three times a day (TID) | ORAL | 2 refills | Status: DC

## 2021-03-03 MED ORDER — GABAPENTIN 100 MG PO CAPS: ORAL_CAPSULE | ORAL | 3 refills | Status: DC

## 2021-03-03 MED ORDER — LORAZEPAM 1 MG PO TABS: ORAL_TABLET | ORAL | 2 refills | Status: DC

## 2021-03-03 NOTE — Patient Instructions (Addendum)
F/u in 10 to 12 weeks, call if you need me sooner  Please get fasting cBC, lipid, cmp and eGFr, TSH and vit D in next 1 week  Increased dose buspar 7.5 mg three times daily strating today   Please Start gabapentin 100 mg two at bedtime  You are referred to therapist and also psychiatry as we discussed  Thanks for choosing [facility], we consider it a privelige to serve you.

## 2021-03-07 ENCOUNTER — Encounter: Payer: Self-pay | Admitting: Family Medicine

## 2021-03-07 ENCOUNTER — Telehealth: Payer: Self-pay | Admitting: Family Medicine

## 2021-03-07 NOTE — Progress Notes (Signed)
° ° °  Catherine Allen     MRN: [id number]      DOB: 12/20/1975  HPI: Patient is in for annual physical exam. Majority of the visit was spent discussing uncontrolled anxiety and panic attacks. Catherine Allen feels as though she "is not being heard" and states that in the past 2 months in particular her anxiety level has significantly increased and states that on averge 3 times weekly she has panic attacks which require lorazepam to calm her down. Stating she does not want to have to go to the ED to get the medication she needs After GAD review , although her buspar dose was increased her level is unchanged  She reports social withdrawal because of anxiety, and feels that 15 lorazepam tabs per month would be a more reasonable/ necessary amt than 12 which I have calculated based on her reporting, for her to be controlled She is open to therapy says it would be helpful if she could learn ways to get through the panic attacks,  also advised her that Psychiatry managing her medication since there has not been much response to treatment offered so far is a better option for her as well. I did also try to assure her that I am listening to her and trying to address her needs  Immunization is reviewed , and  cancer screening are reviewed  Labs to be updated in next week Medications are reviewed and changes made as deemd necessary   PE: BP 126/83 (BP Location: Left Arm, Patient Position: Sitting, Cuff Size: Large)    Pulse 92    Ht 5\' 3"  (1.6 m)    Wt 174 lb (78.9 kg)    LMP 03/03/2021 (Exact Date)    SpO2 99%    BMI 30.82 kg/m   Pleasant  female, alert and oriented x 3, in no cardio-pulmonary distress. Afebrile. HEENT No facial trauma or asymetry. .  Extra occullar muscles intact.. External ears normal, . Neck: supple,   Chest: Clear to ascultation bilaterally.No crackles or wheezes.  Breast: Normal mammogram 12/2020  Cardiovascular system; Heart sounds normal,  S1 and  S2 ,no S3.  No murmur, or  thrill. .      Musculoskeletal exam: Full ROM of spine, hips , shoulders and knees. No deformity ,swelling or crepitus noted. No muscle wasting or atrophy.   Neurologic: Cranial nerves 2 to 12 intact. Power,  normal throughout. No disturbance in gait. No tremor.  Skin: Intact, no ulceration, erythema , scaling or rash noted. Pigmentation normal throughout  Psych; Anxious  mood and affect.  Assessment & Plan:  Annual physical exam Annual exam as documented. Immunization and cancer screening needs are specifically addressed at this visit.   GAD (generalized anxiety disorder) Uncontrolled, worse in past 2 months, little to no response to increased dose of buspar. Needs Psych management refer to therapy as well as to psych. Increase buspar as currently on very low dose For frequent panic attacks, reported as 3 per week, Lorazepam is increased also , however plan is for Psychiatry to manage pt   Panic attacks Reports on avg 3/ week , psychiatry and psychotherapy to manage  Decreased vision in both eyes Needs ophthalmology eval and management , will follow up post visit

## 2021-03-07 NOTE — Telephone Encounter (Signed)
Pls call pt. She has significant loss of vision uncorrected, if she regularly sees ophthalmology all good, if not , needs referral, pls arrange I will sign, if this is needed

## 2021-03-07 NOTE — Assessment & Plan Note (Signed)
Reports on avg 3/ week , psychiatry and psychotherapy to manage

## 2021-03-07 NOTE — Assessment & Plan Note (Signed)
Needs ophthalmology eval and management , will follow up post visit

## 2021-03-07 NOTE — Assessment & Plan Note (Signed)
Annual exam as documented. . Immunization and cancer screening needs are specifically addressed at this visit.  

## 2021-03-07 NOTE — Assessment & Plan Note (Signed)
Uncontrolled, worse in past 2 months, little to no response to increased dose of buspar. Needs Psych management refer to therapy as well as to psych. Increase buspar as currently on very low dose For frequent panic attacks, reported as 3 per week, Lorazepam is increased also , however plan is for Psychiatry to manage pt

## 2021-03-07 NOTE — Assessment & Plan Note (Signed)
Managed with gabapentin, still awakens at night, has poor sleep, increase to 2 at bedtime

## 2021-03-08 NOTE — Telephone Encounter (Signed)
She sees the eye dr yearly and has glasses but did not have them the day of her ov

## 2021-03-11 DIAGNOSIS — E559 Vitamin D deficiency, unspecified: Secondary | ICD-10-CM | POA: Diagnosis not present

## 2021-03-11 DIAGNOSIS — I10 Essential (primary) hypertension: Secondary | ICD-10-CM | POA: Diagnosis not present

## 2021-03-11 DIAGNOSIS — E782 Mixed hyperlipidemia: Secondary | ICD-10-CM | POA: Diagnosis not present

## 2021-03-12 LAB — CBC
Hematocrit: 39.8 % (ref 34.0–46.6)
Hemoglobin: 13.1 g/dL (ref 11.1–15.9)
MCH: 27.5 pg (ref 26.6–33.0)
MCHC: 32.9 g/dL (ref 31.5–35.7)
MCV: 84 fL (ref 79–97)
Platelets: 354 10*3/uL (ref 150–450)
RBC: 4.76 x10E6/uL (ref 3.77–5.28)
RDW: 11.8 % (ref 11.7–15.4)
WBC: 7 10*3/uL (ref 3.4–10.8)

## 2021-03-12 LAB — CMP14+EGFR
ALT: 30 IU/L (ref 0–32)
AST: 24 IU/L (ref 0–40)
Albumin/Globulin Ratio: 1.8 (ref 1.2–2.2)
Albumin: 4.2 g/dL (ref 3.8–4.8)
Alkaline Phosphatase: 79 IU/L (ref 44–121)
BUN/Creatinine Ratio: 10 (ref 9–23)
BUN: 9 mg/dL (ref 6–24)
Bilirubin Total: 0.4 mg/dL (ref 0.0–1.2)
CO2: 21 mmol/L (ref 20–29)
Calcium: 9.2 mg/dL (ref 8.7–10.2)
Chloride: 101 mmol/L (ref 96–106)
Creatinine, Ser: 0.93 mg/dL (ref 0.57–1.00)
Globulin, Total: 2.3 g/dL (ref 1.5–4.5)
Glucose: 84 mg/dL (ref 70–99)
Potassium: 3.4 mmol/L — ABNORMAL LOW (ref 3.5–5.2)
Sodium: 140 mmol/L (ref 134–144)
Total Protein: 6.5 g/dL (ref 6.0–8.5)
eGFR: 77 mL/min/{1.73_m2} (ref >59)

## 2021-03-12 LAB — LIPID PANEL
Chol/HDL Ratio: 3.5 ratio (ref 0.0–4.4)
Cholesterol, Total: 150 mg/dL (ref 100–199)
HDL: 43 mg/dL (ref >39)
LDL Chol Calc (NIH): 89 mg/dL (ref 0–99)
Triglycerides: 97 mg/dL (ref 0–149)
VLDL Cholesterol Cal: 18 mg/dL (ref 5–40)

## 2021-03-12 LAB — TSH: TSH: 2.24 u[IU]/mL (ref 0.450–4.500)

## 2021-03-12 LAB — VITAMIN D 25 HYDROXY (VIT D DEFICIENCY, FRACTURES): Vit D, 25-Hydroxy: 54.5 ng/mL (ref 30.0–100.0)

## 2021-03-27 ENCOUNTER — Other Ambulatory Visit: Payer: Self-pay | Admitting: Family Medicine

## 2021-03-28 ENCOUNTER — Other Ambulatory Visit: Payer: Self-pay | Admitting: Licensed Clinical Social Worker

## 2021-03-28 NOTE — Patient Instructions (Signed)
Visit Information ? ?Ms. Courtois was given information about Medicaid Managed Care team care coordination services as a part of their [facility] Community Plan Medicaid benefit. Lakeya D Pickerel did not consent to engagement with the [facility] Managed Care team.  ? ?If you are experiencing a medical emergency, please call 911 or report to your local emergency department or urgent care.  ? ?If you have a non-emergency medical problem during routine business hours, please contact your provider's office and ask to speak with a nurse.  ? ?For questions related to your [facility], please call: [phone] or visit the homepage here: [email or web address] ? ?If you would like to schedule transportation through your [facility], please call the following number at least 2 days in advance of your appointment: [phone]. ? Rides for urgent appointments can also be made after hours by calling Member Services. ? ?Call the Behavioral Health Crisis Line at [phone], at any time, 24 hours a day, 7 days a week. If you are in danger or need immediate medical attention call 911. ? ?If you would like help to quit smoking, call 1-800-QUIT-NOW ((628) 769-8955) OR Espa?ol: 1-855-D?jelo-Ya 564-331-3023) o para m?s informaci?n haga clic aqu? or Text READY to 200-400 to register via text ? ? ?The Patient will call [facility] LCSW* as advised to if she changes her mind about enrollment in [facility] program.  ? ?Dickie La, BSW, MSW, LCSW ?Managed Medicaid LCSW ?[place]  [facility] ?[email or web address] ?Phone: [phone] ? ? ? ?Following is a copy of your plan of care:  ?There are no care plans that you recently modified to display for this patient. ?  ?

## 2021-03-28 NOTE — Patient Outreach (Signed)
?Medicaid Managed Care ?Social Work Note ? ?03/28/2021 ?Name:  Catherine Allen MRN:  [id number] DOB:  Feb 18, 1975 ? ?Catherine Allen is an 46 y.o. year old female who is a primary patient of Catherine Perches, MD.  The Medicaid Managed Care Coordination team was consulted for assistance with:  Mental Health Counseling and Resources ? ?Catherine Allen was given information about Medicaid Managed Care Coordination team services today. Catherine Allen Patient did not agree to services.  Patient provided Medicaid Managed Care team contact information and advised to reach out if in need of care coordination or care management services.  Primary care provider advised of patient declination of services. ? ?Engaged with patient  for by telephone forinitial visit in response to referral for case management and/or care coordination services.  ? ?Assessments/Interventions:  Review of past medical history, allergies, medications, health status, including review of consultants reports, laboratory and other test data, was performed as part of comprehensive evaluation and provision of chronic care management services. ? ?SDOH: (Social Determinant of Health) assessments and interventions performed: ? ? ?Advanced Directives Status:  Not addressed in this encounter. ? ?Care Plan ?                ?No Known Allergies ? ?Medications Reviewed Today   ? ? Reviewed by Catherine Perches, MD (Physician) on 03/07/21 at 0250  Med List Status: <None>  ? ?Medication Order Taking? Sig Documenting Provider Last Dose Status Informant  ?acyclovir (ZOVIRAX) 400 MG tablet [id number] Yes TAKE 1 TABLET BY MOUTH TWICE A DAY Catherine Halon, MD Taking Active   ?         ?Med Note Catherine Allen   Thu Mar 03, 2021  9:25 AM) As needed  ?amLODipine (NORVASC) 10 MG tablet [id number] Yes TAKE 1 TABLET BY MOUTH EVERY DAY Catherine Perches, MD Taking Active   ?busPIRone (BUSPAR) 7.5 MG tablet [id number] Yes Take 1 tablet (7.5 mg total) by mouth 3 (three) times  daily. Catherine Perches, MD  Active   ?fexofenadine [facility]) 180 MG tablet [id number] Yes Take 180 mg by mouth daily. As needed [provider] Taking Active Self  ?gabapentin (NEURONTIN) 100 MG capsule [id number] Yes Take two capsules by mouth at bedtime Catherine Perches, MD  Active   ?levonorgestrel-ethinyl estradiol (SEASONALE) 0.15-0.03 MG tablet [id number] Yes TAKE 1 TABLET BY MOUTH EVERY DAY Catherine Perches, MD Taking Active   ?LORazepam (ATIVAN) 1 MG tablet [id number] Yes Take one tablet by mouth once daily, as needed , for extreme anxiety and panic Catherine Perches, MD Taking Active   ?LORazepam (ATIVAN) 1 MG tablet [id number] Yes Take one tablet by mouth once daily , as needed, for panic attack or extreme anxiety Catherine Perches, MD  Active   ?montelukast (SINGULAIR) 10 MG tablet [id number] Yes TAKE 1 TABLET BY MOUTH EVERYDAY AT BEDTIME Catherine Perches, MD Taking Active   ?naproxen sodium (ALEVE) 220 MG tablet [id number] Yes Take 1 tablet (220 mg total) by mouth 2 (two) times daily as needed. Catherine Perches, MD Taking Active   ?pantoprazole (PROTONIX) 40 MG tablet [id number] Yes Take 1 tablet (40 mg total) by mouth daily before breakfast. Catherine Median, PA-C Taking Active   ?potassium chloride (KLOR-CON) 10 MEQ tablet [id number] Yes TAKE 1 TABLET BY MOUTH TWICE A DAY Catherine Perches, MD Taking Active   ?rosuvastatin (CRESTOR) 20 MG tablet [id number] Yes TAKE 1 TABLET BY MOUTH EVERY DAY STOP PRAVASTATIN Catherine Allen  E, MD Taking Active   ?triamterene-hydrochlorothiazide (MAXZIDE) 75-50 MG tablet [id number] Yes TAKE 1 TABLET BY MOUTH EVERY DAY Catherine Perches, MD Taking Active   ? ?  ?  ? ?  ? ? ?Patient Active Problem List  ? Diagnosis Date Noted  ? IFG (impaired fasting glucose) 08/24/2020  ? Neck pain on right side 07/02/2020  ? 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 Allergic dermatitis 10/17/2019  ? GERD (gastroesophageal reflux disease)  04/07/2019  ? Bunion of great toe of right foot 11/22/2017  ? Decreased vision in both eyes 06/08/2017  ? Panic attacks 11/22/2016  ? Breast calcifications on mammogram 09/11/2016  ? GAD (generalized anxiety disorder) 07/14/2015  ? Annual physical exam 02/11/2015  ? Allergic rhinitis 01/22/2014  ? 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  ? Hyperlipemia 07/09/2009  ? Obesity (BMI 30.0-34.9) 11/23/2008  ? Essential hypertension 09/23/2008  ? ? ?Conditions to be addressed/monitored per PCP order:  Anxiety ? ?Patient declines need for [facility] team involvement at this time. Patient reports that she does not feel that she needs referrals to either psychiatry or counseling. A past referral was made to [facility] but she declined wanting these services. She reports that her PCP manages her medication at this time and she does not feel that mental health services would be helpful for her. She is agreeable to contact this [facility] LCSW directly if she changes her mind. Contact information provided to patient. Patient was educated briefly on effective coping skills for anxiety management such as throwing cold water on face, deep breathing exercises, guided meditation, nature, healthy self-care, etc. Patient was appreciate of phone call and information provided. [facility] LCSW will close referral at this time.  ? ?There are no care plans that you recently modified to display for this patient. ? ? ?Follow up:  Patient requests no follow-up at this time. ? ?Plan: The Managed Medicaid care management team is available to follow up with the patient after provider conversation with the patient regarding recommendation for care management engagement and subsequent re-referral to the care management team.  ? ?Catherine Allen, BSW, MSW, LCSW ?Managed Medicaid LCSW ?[place]  [facility] ?[email or web address] ?Phone: [phone] ? ? ?

## 2021-03-31 ENCOUNTER — Encounter: Payer: Self-pay | Admitting: Internal Medicine

## 2021-04-27 ENCOUNTER — Other Ambulatory Visit: Payer: Self-pay | Admitting: Family Medicine

## 2021-04-30 ENCOUNTER — Other Ambulatory Visit: Payer: Self-pay | Admitting: Family Medicine

## 2021-05-12 ENCOUNTER — Encounter: Payer: Self-pay | Admitting: Family Medicine

## 2021-05-12 ENCOUNTER — Ambulatory Visit (INDEPENDENT_AMBULATORY_CARE_PROVIDER_SITE_OTHER): Payer: Medicaid Other | Admitting: Family Medicine

## 2021-05-12 VITALS — BP 132/81 | HR 82 | Ht 64.0 in | Wt 175.1 lb

## 2021-05-12 DIAGNOSIS — E669 Obesity, unspecified: Secondary | ICD-10-CM | POA: Diagnosis not present

## 2021-05-12 DIAGNOSIS — Z1231 Encounter for screening mammogram for malignant neoplasm of breast: Secondary | ICD-10-CM

## 2021-05-12 DIAGNOSIS — F411 Generalized anxiety disorder: Secondary | ICD-10-CM

## 2021-05-12 DIAGNOSIS — J3089 Other allergic rhinitis: Secondary | ICD-10-CM

## 2021-05-12 DIAGNOSIS — F410 Panic disorder [episodic paroxysmal anxiety] without agoraphobia: Secondary | ICD-10-CM

## 2021-05-12 DIAGNOSIS — K219 Gastro-esophageal reflux disease without esophagitis: Secondary | ICD-10-CM

## 2021-05-12 DIAGNOSIS — I10 Essential (primary) hypertension: Secondary | ICD-10-CM

## 2021-05-12 DIAGNOSIS — E782 Mixed hyperlipidemia: Secondary | ICD-10-CM

## 2021-05-12 MED ORDER — LORAZEPAM 1 MG PO TABS: ORAL_TABLET | ORAL | 5 refills | Status: DC

## 2021-05-12 NOTE — Assessment & Plan Note (Signed)
Controlled, no change in medication  

## 2021-05-12 NOTE — Progress Notes (Signed)
? ?Catherine Allen     MRN: [id number]      DOB: [birth year] ? ? ?HPI ?[name] is here for follow up and re-evaluation of chronic medical conditions, medication management and review of any available recent lab and radiology data.  ?Preventive health is updated, specifically  Cancer screening and Immunization.   ?Refuses psych services, and states he ?The PT denies any adverse reactions to current medications since the last visit.  ?There are no new concerns.  ?There are no specific complaints  ? ?ROS ?Denies recent fever or chills. ?Denies sinus pressure, nasal congestion, ear pain or sore throat. ?Denies chest congestion, productive cough or wheezing. ?Denies chest pains, palpitations and leg swelling ?Denies abdominal pain, nausea, vomiting,diarrhea or constipation.   ?Denies dysuria, frequency, hesitancy or incontinence. ?Denies joint pain, swelling and limitation in mobility. ?Denies headaches, seizures, numbness, or tingling. ?Denies depression, uncontrolled anxiety or insomnia. ?Denies skin break down or rash. ? ? ?PE ? ?BP 132/81   Pulse 82   Ht 5\' 4"  (1.626 m)   Wt 175 lb 1.3 oz (79.4 kg)   SpO2 97%   BMI 30.05 kg/m?  ? ?Patient alert and oriented and in no cardiopulmonary distress. ? ?HEENT: No facial asymmetry, EOMI,     Neck supple . ? ?Chest: Clear to auscultation bilaterally. ? ?CVS: S1, S2 no murmurs, no S3.Regular rate. ? ?ABD: Soft non tender.  ? ?Ext: No edema ? ?MS: Adequate ROM spine, shoulders, hips and knees. ? ?Skin: Intact, no ulcerations or rash noted. ? ?Psych: Good eye contact, normal affect. Memory intact not anxious or depressed appearing. ? ?CNS: CN 2-12 intact, power,  normal throughout.no focal deficits noted. ? ? ?Assessment & Plan ? ?Essential hypertension ?Controlled, no change in medication ?DASH diet and commitment to daily physical activity for a minimum of 30 minutes discussed and encouraged, as a part of hypertension management. ?The importance of attaining a healthy  weight is also discussed. ? ? ?  05/12/2021  ?  9:14 AM 03/03/2021  ?  9:24 AM 02/10/2021  ?  3:43 PM 11/26/2020  ?  9:30 AM 08/24/2020  ?  9:07 AM 07/23/2020  ?  9:59 AM 06/29/2020  ?  3:02 PM  ?BP/Weight  ?Systolic BP Q000111Q [id number] 0000000 Q000111Q 138 140 135  ?Diastolic BP 81 83 82 78 88 86 80  ?Wt. (Lbs) 175.08 174 174 174.04 176.12 174 173  ?BMI 30.05 kg/m2 30.82 kg/m2 30.82 kg/m2 30.83 kg/m2 30.23 kg/m2 29.87 kg/m2 29.7 kg/m2  ? ? ? ? ? ?Panic attacks ?Controlled, no change in medication ? ? ?Obesity (BMI 30.0-34.9) ? ?Patient re-educated about  the importance of commitment to a  minimum of 150 minutes of exercise per week as able. ? ?The importance of healthy food choices with portion control discussed, as well as eating regularly and within a 12 hour window most days. ?The need to choose "clean , green" food 50 to 75% of the time is discussed, as well as to make water the primary drink and set a goal of 64 ounces water daily. ? ?  ? ?  05/12/2021  ?  9:14 AM 03/03/2021  ?  9:24 AM 02/10/2021  ?  3:43 PM  ?Weight /BMI  ?Weight 175 lb 1.3 oz 174 lb 174 lb  ?Height 5\' 4"  (1.626 m) 5\' 3"  (1.6 m) 5\' 3"  (1.6 m)  ?BMI 30.05 kg/m2 30.82 kg/m2 30.82 kg/m2  ? ? ? ? ?Hyperlipemia ?Hyperlipidemia:Low fat diet discussed and encouraged. ? ? ?  Lipid Panel  ?Lab Results  ?Component Value Date  ? CHOL 150 03/11/2021  ? HDL 43 03/11/2021  ? [place] 89 03/11/2021  ? TRIG 97 03/11/2021  ? CHOLHDL 3.5 03/11/2021  ? ? ? ?Updated lab needed at/ before next visit. ? ? ?GAD (generalized anxiety disorder) ?Controlled, no change in medication ? ? ?GERD (gastroesophageal reflux disease) ?Controlled, no change in medication ? ? ?Allergic rhinitis ?Controlled, no change in medication ? ? ?

## 2021-05-12 NOTE — Assessment & Plan Note (Signed)
Hyperlipidemia:Low fat diet discussed and encouraged. ? ? ?Lipid Panel  ?Lab Results  ?Component Value Date  ? CHOL 150 03/11/2021  ? HDL 43 03/11/2021  ? LDLCALC 89 03/11/2021  ? TRIG 97 03/11/2021  ? CHOLHDL 3.5 03/11/2021  ? ? ? ?Updated lab needed at/ before next visit. ? ?

## 2021-05-12 NOTE — Assessment & Plan Note (Signed)
?  Patient re-educated about  the importance of commitment to a  minimum of 150 minutes of exercise per week as able. ? ?The importance of healthy food choices with portion control discussed, as well as eating regularly and within a 12 hour window most days. ?The need to choose "clean , green" food 50 to 75% of the time is discussed, as well as to make water the primary drink and set a goal of 64 ounces water daily. ? ?  ? ?  05/12/2021  ?  9:14 AM 03/03/2021  ?  9:24 AM 02/10/2021  ?  3:43 PM  ?Weight /BMI  ?Weight 175 lb 1.3 oz 174 lb 174 lb  ?Height 5\' 4"  (1.626 m) 5\' 3"  (1.6 m) 5\' 3"  (1.6 m)  ?BMI 30.05 kg/m2 30.82 kg/m2 30.82 kg/m2  ? ? ? ?

## 2021-05-12 NOTE — Assessment & Plan Note (Signed)
Controlled, no change in medication ?DASH diet and commitment to daily physical activity for a minimum of 30 minutes discussed and encouraged, as a part of hypertension management. ?The importance of attaining a healthy weight is also discussed. ? ? ?  05/12/2021  ?  9:14 AM 03/03/2021  ?  9:24 AM 02/10/2021  ?  3:43 PM 11/26/2020  ?  9:30 AM 08/24/2020  ?  9:07 AM 07/23/2020  ?  9:59 AM 06/29/2020  ?  3:02 PM  ?BP/Weight  ?Systolic BP 132 126 138 134 138 140 135  ?Diastolic BP 81 83 82 78 88 86 80  ?Wt. (Lbs) 175.08 174 174 174.04 176.12 174 173  ?BMI 30.05 kg/m2 30.82 kg/m2 30.82 kg/m2 30.83 kg/m2 30.23 kg/m2 29.87 kg/m2 29.7 kg/m2  ? ? ? ? ?

## 2021-05-12 NOTE — Patient Instructions (Signed)
F/u in mid  October, flu vaccine at visit, call if you need me sooner ? ?Please get TdAP at pharmacy ? ?No change in medications ? ?Please schedule Dec mammogram at checkout ? ?It is important that you exercise regularly at least 30 minutes 5 times a week. If you develop chest pain, have severe difficulty breathing, or feel very tired, stop exercising immediately and seek medical attention  ? ?Fasting lipid, cmp and eGFr 5 days before visit ? ? ?Thanks for choosing [facility], we consider it a privelige to serve you. ? ? ? ? ?

## 2021-05-24 ENCOUNTER — Other Ambulatory Visit: Payer: Self-pay

## 2021-05-24 ENCOUNTER — Encounter: Payer: Self-pay | Admitting: Family Medicine

## 2021-05-24 MED ORDER — POTASSIUM CHLORIDE ER 10 MEQ PO TBCR: 10.0000 meq | EXTENDED_RELEASE_TABLET | Freq: Two times a day (BID) | ORAL | 1 refills | Status: DC

## 2021-06-19 ENCOUNTER — Other Ambulatory Visit: Payer: Self-pay | Admitting: Internal Medicine

## 2021-06-19 DIAGNOSIS — A6004 Herpesviral vulvovaginitis: Secondary | ICD-10-CM

## 2021-08-02 ENCOUNTER — Other Ambulatory Visit: Payer: Self-pay | Admitting: Family Medicine

## 2021-09-04 ENCOUNTER — Other Ambulatory Visit: Payer: Self-pay | Admitting: Gastroenterology

## 2021-09-05 NOTE — Telephone Encounter (Signed)
Sending in refills.  She is overdue for 1 year follow-up and needs to be scheduled for an appointment.  Would be okay to be seen virtually.

## 2021-09-12 ENCOUNTER — Other Ambulatory Visit: Payer: Self-pay | Admitting: Internal Medicine

## 2021-09-12 ENCOUNTER — Other Ambulatory Visit: Payer: Self-pay | Admitting: Family Medicine

## 2021-09-12 DIAGNOSIS — A6004 Herpesviral vulvovaginitis: Secondary | ICD-10-CM

## 2021-09-25 ENCOUNTER — Other Ambulatory Visit: Payer: Self-pay | Admitting: Family Medicine

## 2021-09-28 NOTE — Progress Notes (Unsigned)
GI Office Note    Referring Provider: Kerri Perches, MD Primary Care Physician:  Kerri Perches, MD Primary Gastroenterologist: Dr. Jena Gauss  Date:  09/29/2021  ID:  Catherine Allen, DOB 1975/08/20, MRN [id number]   Chief Complaint   Chief Complaint  Patient presents with   Follow-up    Here for further refills on pantoprazole.     History of Present Illness  Catherine Allen is a 46 y.o. female with a history of GERD, dysphagia, anxiety, HLD, and HTN presenting today for follow up to further refills.  Last office visit 04/05/20. GERD well controlled on protonix 40 mg daily. No dysphagia, abdominal pain, nausea, vomiting, brbpr, melena, constipation, diarrhea, unintentional weight loss. Advised to continue pantoprazole 40 mg daily.   Today: Has had chronic issues with constipation. Has tried metamucil and benefiber but she feels like the more she goes the more issues she has with her hemorrhoids. Started earlier this year but not consistent. Has intermittent bleeding with hemorrhoids but never a lot. Has been going a little bit everyday. Keeps suppositories and creams on hand to help with her hemorrhoids. Appetite fluctuates usually and is more so in the afternoons she has larger meals. Does have to have a small snack in the mornings. Does good with drinking liquids. Has started drinking more water and less sodas. Keeps miralax at her house as well and will take it if she feels like she needs a cleaning out.  GERD is fairly well controlled. Has episodes intermittent. Sometimes feels like hamburger does not go down as well. Does take famotidine as well to help with trigger foods, may be once a week or so. Has been eating slower and eating less at once.    Current Outpatient Medications  Medication Sig Dispense Refill   acyclovir (ZOVIRAX) 400 MG tablet TAKE 1 TABLET BY MOUTH TWICE A DAY 60 tablet 2   amLODipine (NORVASC) 10 MG tablet TAKE 1 TABLET BY MOUTH EVERY DAY 90 tablet  1   busPIRone (BUSPAR) 7.5 MG tablet TAKE 1 TABLET BY MOUTH THREE TIMES A DAY 90 tablet 5   fexofenadine (ALLEGRA) 180 MG tablet Take 180 mg by mouth daily. As needed     Levonorgest-Eth Est & Eth Est 42-21-21-7 DAYS TABS TAKE 1 TABLET BY MOUTH EVERY DAY 91 tablet 1   LORazepam (ATIVAN) 1 MG tablet Take  one tablet once daily, as needed, for extreme anxiety or panic attack 12 tablet 5   montelukast (SINGULAIR) 10 MG tablet TAKE 1 TABLET BY MOUTH EVERYDAY AT BEDTIME 90 tablet 1   potassium chloride (KLOR-CON) 10 MEQ tablet Take 1 tablet (10 mEq total) by mouth 2 (two) times daily. 180 tablet 1   rosuvastatin (CRESTOR) 20 MG tablet TAKE 1 TABLET BY MOUTH EVERY DAY STOP PRAVASTATIN 90 tablet 3   triamterene-hydrochlorothiazide (MAXZIDE) 75-50 MG tablet TAKE 1 TABLET BY MOUTH EVERY DAY 90 tablet 2   pantoprazole (PROTONIX) 40 MG tablet Take 1 tablet (40 mg total) by mouth daily before breakfast. 90 tablet 3   No current facility-administered medications for this visit.    Past Medical History:  Diagnosis Date   Allergy    Anxiety    [employer] 07/26/2019   Dyspareunia    Hemorrhoids    HLD (hyperlipidemia)    HTN (hypertension)    Hypertension    [employer] 07/26/2019   MRSA (methicillin resistant Staphylococcus aureus) 2010   boils thighs   S/P endoscopy June 2012   Dr. Jena Gauss:  mild erosive esophagitis, chronic duodenitis, mild gastritis, stop NSAIDs.    Vaginitis     Past Surgical History:  Procedure Laterality Date   CESAREAN SECTION  2004   CESAREAN SECTION N/A    [employer] 07/26/2019   COLONOSCOPY WITH PROPOFOL N/A 11/27/2019   Procedure: COLONOSCOPY WITH PROPOFOL;  Surgeon: Daneil Dolin, MD;  Location: [facility];  Service: Endoscopy;  Laterality: N/A;  9:00am   ESOPHAGOGASTRODUODENOSCOPY  2012   small hiatal hernia and mild erosive reflux esophagitis    Family History  Problem Relation Age of Onset   Hypertension Mother    Asthma Sister    Breast cancer Cousin     Colon cancer Neg Hx     Allergies as of 09/29/2021   (No Known Allergies)    Social History   Socioeconomic History   Marital status: Single    Spouse name: Not on file   Number of children: 2   Years of education: Not on file   Highest education level: Not on file  Occupational History   Occupation: homemaker  Tobacco Use   Smoking status: Never   Smokeless tobacco: Never  Substance and Sexual Activity   Alcohol use: No   Drug use: No   Sexual activity: Yes    Partners: Male    Birth control/protection: Pill  Other Topics Concern   Not on file  Social History Narrative   LIves w/ 2 sons 8 &16   Social Determinants of Health   Financial Resource Strain: Not on file  Food Insecurity: Not on file  Transportation Needs: Not on file  Physical Activity: Not on file  Stress: Not on file  Social Connections: Not on file     Review of Systems   Gen: + lack of appetite. Denies fever, chills. Denies fatigue, weakness, weight loss.  CV: Denies chest pain, palpitations, syncope, peripheral edema, and claudication. Resp: Denies dyspnea at rest, cough, wheezing, coughing up blood, and pleurisy. GI: See HPI Derm: Denies rash, itching, dry skin Psych: Denies depression, anxiety, memory loss, confusion. No homicidal or suicidal ideation.  Heme: Denies bruising, bleeding, and enlarged lymph nodes.   Physical Exam   BP (!) 145/87 (BP Location: Right Arm, Patient Position: Sitting, Cuff Size: Normal)   Pulse 85   Temp (!) 97.5 F (36.4 C) (Temporal)   Ht 5\' 3"  (1.6 m)   Wt 171 lb (77.6 kg)   LMP  (LMP Unknown)   SpO2 98%   BMI 30.29 kg/m   General:   Alert and oriented. No distress noted. Pleasant and cooperative.  Head:  Normocephalic and atraumatic. Eyes:  Conjuctiva clear without scleral icterus. Lungs:  Clear to auscultation bilaterally. No wheezes, rales, or rhonchi. No distress.  Heart:  S1, S2 present without murmurs appreciated.  Abdomen:  +BS, soft,  non-tender and non-distended. No rebound or guarding. No HSM or masses noted. Rectal: Deferred Msk:  Symmetrical without gross deformities. Normal posture. Extremities:  Without edema. Neurologic:  Alert and  oriented x4 Psych:  Alert and cooperative. Normal mood and affect.   Assessment  Catherine Allen is a 46 y.o. female with a history of GERD, dysphagia, anxiety, HLD, and HTN presenting today for follow up to further refills.  GERD: well controlled on pantoprazole 40 mg daily.  She does report some intermittent episodes, usually with hamburger.  Takes famotidine or Zantac as needed for breakthrough symptoms, or she knows she is going to have a trigger food.  This is very  intermittent, maybe once a week.  She does endorse some fluctuation of her appetite, usually does not have much of 1 in the mornings but throughout the day her appetite increases and usually will eat larger meals in the afternoon.  She has been working on eating slower and smaller portions.  She denies any nausea or vomiting associated with this.  For now we will continue pantoprazole 40 mg daily, refill sent to pharmacy.  Also advised for her to continue famotidine as needed and that this would be a safer option than Zantac.  Constipation: Chronic.  Previously was using Metamucil, but when she ran out she picked up over-the-counter Benefiber which has been helping her be more consistent with her bowel movements.  She reportedly instead of going every 2 to 3 days without a bowel movement, she is having a little bit of stool every day although occasionally has incomplete emptying.  She also reports that with more regularity in her bowel movements, she has been having more issues with her hemorrhoids.  She has been using over-the-counter hemorrhoidal cream and suppositories as needed.,  Denies any bleeding or rectal itching.  She reports an adequate amount of water intake and has tried MiraLAX in the past if she felt like she needed  a "clean out".  We also had a discussion about pre and probiotics and that there is conflicting evidence about the efficacy.  Advised her to continue Benefiber, and may start MiraLAX 17 g (1 capful) every 2 to 3 days, or more frequently if needed to aid in better bowel regularity.  PLAN    Continue pantoprazole 40 mg daily, refill sent to pharmacy Famotidine 20 mg  as needed.  Benefiber 2-3 teaspoons daily Miralax 17 g (1 capful) every 2-3 days or more frequent if needed. Continue over the counter anti-hemorrhoid cream as needed, could consider banding in the future if desired.  Follow up in 1 year.     Brooke Bonito, MSN, FNP-BC, AGACNP-BC [facility]

## 2021-09-29 ENCOUNTER — Ambulatory Visit (INDEPENDENT_AMBULATORY_CARE_PROVIDER_SITE_OTHER): Payer: Medicaid Other | Admitting: Gastroenterology

## 2021-09-29 ENCOUNTER — Encounter: Payer: Self-pay | Admitting: Gastroenterology

## 2021-09-29 VITALS — BP 145/87 | HR 85 | Temp 97.5°F | Ht 63.0 in | Wt 171.0 lb

## 2021-09-29 DIAGNOSIS — K5900 Constipation, unspecified: Secondary | ICD-10-CM | POA: Diagnosis not present

## 2021-09-29 DIAGNOSIS — K219 Gastro-esophageal reflux disease without esophagitis: Secondary | ICD-10-CM | POA: Diagnosis not present

## 2021-09-29 MED ORDER — PANTOPRAZOLE SODIUM 40 MG PO TBEC: 40.0000 mg | DELAYED_RELEASE_TABLET | Freq: Every day | ORAL | 3 refills | Status: DC

## 2021-09-29 NOTE — Patient Instructions (Signed)
I sent a refill of your pantoprazole to the pharmacy.  You may continue to take famotidine as needed.  Continue daily Benefiber supplement.  You may experiment with more frequent dosing of MiraLAX.  I believe this may help with your incomplete emptying.  You can start by trying 17 g (1 capful) every 2 to 3 days and increase as needed.  You may also keep trying probiotics/prebiotic's.  Probiotics are supplements that add healthy bacteria to your gut.  Prebiotics are types of fiber supplements that feed the good bacteria they already have in your gut.  A combination of these may help you with your constipation or you may not see much of a difference.  As we discussed evidence is mixed on this topic.   Follow-up in 1 year or sooner if needed.  If you decide you want a more permanent fix for your hemorrhoids, we do offer hemorrhoid banding in the office.  It was a pleasure to see you today. I want to create trusting relationships with patients. If you receive a survey regarding your visit,  I greatly appreciate you taking time to fill this out on paper or through your MyChart. I value your feedback.  Venetia Night, MSN, FNP-BC, AGACNP-BC [facility]

## 2021-10-05 ENCOUNTER — Other Ambulatory Visit: Payer: Self-pay | Admitting: Family Medicine

## 2021-10-07 DIAGNOSIS — E782 Mixed hyperlipidemia: Secondary | ICD-10-CM | POA: Diagnosis not present

## 2021-10-07 DIAGNOSIS — I10 Essential (primary) hypertension: Secondary | ICD-10-CM | POA: Diagnosis not present

## 2021-10-08 LAB — LIPID PANEL
Chol/HDL Ratio: 3.5 ratio (ref 0.0–4.4)
Cholesterol, Total: 147 mg/dL (ref 100–199)
HDL: 42 mg/dL (ref >39)
LDL Chol Calc (NIH): 89 mg/dL (ref 0–99)
Triglycerides: 85 mg/dL (ref 0–149)
VLDL Cholesterol Cal: 16 mg/dL (ref 5–40)

## 2021-10-08 LAB — CMP14+EGFR
ALT: 25 IU/L (ref 0–32)
AST: 21 IU/L (ref 0–40)
Albumin/Globulin Ratio: 1.8 (ref 1.2–2.2)
Albumin: 4.2 g/dL (ref 3.9–4.9)
Alkaline Phosphatase: 69 IU/L (ref 44–121)
BUN/Creatinine Ratio: 8 — ABNORMAL LOW (ref 9–23)
BUN: 9 mg/dL (ref 6–24)
Bilirubin Total: 0.4 mg/dL (ref 0.0–1.2)
CO2: 22 mmol/L (ref 20–29)
Calcium: 9.3 mg/dL (ref 8.7–10.2)
Chloride: 104 mmol/L (ref 96–106)
Creatinine, Ser: 1.06 mg/dL — ABNORMAL HIGH (ref 0.57–1.00)
Globulin, Total: 2.4 g/dL (ref 1.5–4.5)
Glucose: 94 mg/dL (ref 70–99)
Potassium: 3.8 mmol/L (ref 3.5–5.2)
Sodium: 139 mmol/L (ref 134–144)
Total Protein: 6.6 g/dL (ref 6.0–8.5)
eGFR: 66 mL/min/{1.73_m2} (ref >59)

## 2021-10-10 DIAGNOSIS — H25093 Other age-related incipient cataract, bilateral: Secondary | ICD-10-CM | POA: Diagnosis not present

## 2021-10-10 DIAGNOSIS — H01002 Unspecified blepharitis right lower eyelid: Secondary | ICD-10-CM | POA: Diagnosis not present

## 2021-10-10 DIAGNOSIS — H01001 Unspecified blepharitis right upper eyelid: Secondary | ICD-10-CM | POA: Diagnosis not present

## 2021-10-12 ENCOUNTER — Encounter: Payer: Self-pay | Admitting: Family Medicine

## 2021-10-12 ENCOUNTER — Ambulatory Visit (INDEPENDENT_AMBULATORY_CARE_PROVIDER_SITE_OTHER): Payer: Medicaid Other | Admitting: Family Medicine

## 2021-10-12 VITALS — BP 127/83 | HR 82 | Ht 63.0 in | Wt 172.0 lb

## 2021-10-12 DIAGNOSIS — Z23 Encounter for immunization: Secondary | ICD-10-CM | POA: Diagnosis not present

## 2021-10-12 DIAGNOSIS — L819 Disorder of pigmentation, unspecified: Secondary | ICD-10-CM | POA: Diagnosis not present

## 2021-10-12 DIAGNOSIS — E782 Mixed hyperlipidemia: Secondary | ICD-10-CM

## 2021-10-12 DIAGNOSIS — E669 Obesity, unspecified: Secondary | ICD-10-CM

## 2021-10-12 DIAGNOSIS — I10 Essential (primary) hypertension: Secondary | ICD-10-CM | POA: Diagnosis not present

## 2021-10-12 DIAGNOSIS — F411 Generalized anxiety disorder: Secondary | ICD-10-CM | POA: Diagnosis not present

## 2021-10-12 NOTE — Patient Instructions (Signed)
F/u in 4 months, call if you need me sooner  Flu vaccine today  Please stop birth control pills once you finish this month, as we discussed  Excellent labs  You are referred to Dermatology re mole  I do recommend covid vaccine  Condolence re loss of your mom, as you said " she is in a better place" and you always will love and treasure her  Thanks for choosing [facility], we consider it a privelige to serve you.

## 2021-10-17 ENCOUNTER — Encounter: Payer: Self-pay | Admitting: Family Medicine

## 2021-10-17 MED ORDER — LORAZEPAM 1 MG PO TABS: ORAL_TABLET | ORAL | 5 refills | Status: DC

## 2021-10-17 NOTE — Assessment & Plan Note (Signed)
  Patient re-educated about  the importance of commitment to a  minimum of 150 minutes of exercise per week as able.  The importance of healthy food choices with portion control discussed, as well as eating regularly and within a 12 hour window most days. The need to choose "clean , green" food 50 to 75% of the time is discussed, as well as to make water the primary drink and set a goal of 64 ounces water daily.       10/12/2021    8:58 AM 09/29/2021    1:48 PM 05/12/2021    9:14 AM  Weight /BMI  Weight 172 lb 171 lb 175 lb 1.3 oz  Height 5\' 3"  (1.6 m) 5\' 3"  (1.6 m) 5\' 4"  (1.626 m)  BMI 30.47 kg/m2 30.29 kg/m2 30.05 kg/m2

## 2021-10-17 NOTE — Progress Notes (Signed)
Catherine Allen     MRN: [id number]      DOB: 02-11-1975   HPI Catherine Allen is here for follow up and re-evaluation of chronic medical conditions, medication management and review of any available recent lab and radiology data.  Preventive health is updated, specifically  Cancer screening and Immunization.   Questions or concerns regarding consultations or procedures which the PT has had in the interim are  addressed. The PT denies any adverse reactions to current medications since the last visit.  C/o dark spot on her back present for months   ROS Denies recent fever or chills. Denies sinus pressure, nasal congestion, ear pain or sore throat. Denies chest congestion, productive cough or wheezing. Denies chest pains, palpitations and leg swelling Denies abdominal pain, nausea, vomiting,diarrhea or constipation.   Denies dysuria, frequency, hesitancy or incontinence. Denies joint pain, swelling and limitation in mobility. Denies headaches, seizures, numbness, or tingling. Denies depression, anxiety or insomnia.  PE  BP 127/83 (BP Location: Right Arm, Patient Position: Sitting)   Pulse 82   Ht 5\' 3"  (1.6 m)   Wt 172 lb (78 kg)   LMP  (LMP Unknown)   SpO2 98%   BMI 30.47 kg/m   Patient alert and oriented and in no cardiopulmonary distress.  HEENT: No facial asymmetry, EOMI,     Neck supple .  Chest: Clear to auscultation bilaterally.  CVS: S1, S2 no murmurs, no S3.Regular rate.  ABD: Soft non tender.   Ext: No edema  MS: Adequate ROM spine, shoulders, hips and knees.  Skin: Intact, mole noted on upper back, max dia approx 1cm,  variation in pigmentation noted.  Psych: Good eye contact, normal affect. Memory intact not anxious or depressed appearing.  CNS: CN 2-12 intact, power,  normal throughout.no focal deficits noted.   Assessment & Plan  Essential hypertension Controlled, no change in medication DASH diet and commitment to daily physical activity for a  minimum of 30 minutes discussed and encouraged, as a part of hypertension management. The importance of attaining a healthy weight is also discussed.     10/12/2021    8:58 AM 09/29/2021    1:48 PM 05/12/2021    9:14 AM 03/03/2021    9:24 AM 02/10/2021    3:43 PM 11/26/2020    9:30 AM 08/24/2020    9:07 AM  BP/Weight  Systolic BP 409 811 914 782 956 213 086  Diastolic BP 83 87 81 83 82 78 88  Wt. (Lbs) 172 171 175.08 174 174 174.04 176.12  BMI 30.47 kg/m2 30.29 kg/m2 30.05 kg/m2 30.82 kg/m2 30.82 kg/m2 30.83 kg/m2 30.23 kg/m2       Hyperlipemia Hyperlipidemia:Low fat diet discussed and encouraged.   Lipid Panel  Lab Results  Component Value Date   CHOL 147 10/07/2021   HDL 42 10/07/2021   LDLCALC 89 10/07/2021   TRIG 85 10/07/2021   CHOLHDL 3.5 10/07/2021     Controlled, no change in medication   Pigmented skin lesion persists for several months, refer to Dermatology  Obesity (BMI 30.0-34.9)  Patient re-educated about  the importance of commitment to a  minimum of 150 minutes of exercise per week as able.  The importance of healthy food choices with portion control discussed, as well as eating regularly and within a 12 hour window most days. The need to choose "clean , green" food 50 to 75% of the time is discussed, as well as to make water the primary drink and set a  goal of 64 ounces water daily.       10/12/2021    8:58 AM 09/29/2021    1:48 PM 05/12/2021    9:14 AM  Weight /BMI  Weight 172 lb 171 lb 175 lb 1.3 oz  Height 5\' 3"  (1.6 m) 5\' 3"  (1.6 m) 5\' 4"  (1.626 m)  BMI 30.47 kg/m2 30.29 kg/m2 30.05 kg/m2      GAD (generalized anxiety disorder) Controlled, no change in medication

## 2021-10-17 NOTE — Assessment & Plan Note (Signed)
Controlled, no change in medication  

## 2021-10-17 NOTE — Assessment & Plan Note (Signed)
Controlled, no change in medication DASH diet and commitment to daily physical activity for a minimum of 30 minutes discussed and encouraged, as a part of hypertension management. The importance of attaining a healthy weight is also discussed.     10/12/2021    8:58 AM 09/29/2021    1:48 PM 05/12/2021    9:14 AM 03/03/2021    9:24 AM 02/10/2021    3:43 PM 11/26/2020    9:30 AM 08/24/2020    9:07 AM  BP/Weight  Systolic BP 868 257 493 552 174 715 953  Diastolic BP 83 87 81 83 82 78 88  Wt. (Lbs) 172 171 175.08 174 174 174.04 176.12  BMI 30.47 kg/m2 30.29 kg/m2 30.05 kg/m2 30.82 kg/m2 30.82 kg/m2 30.83 kg/m2 30.23 kg/m2

## 2021-10-17 NOTE — Assessment & Plan Note (Signed)
Hyperlipidemia:Low fat diet discussed and encouraged.   Lipid Panel  Lab Results  Component Value Date   CHOL 147 10/07/2021   HDL 42 10/07/2021   LDLCALC 89 10/07/2021   TRIG 85 10/07/2021   CHOLHDL 3.5 10/07/2021     Controlled, no change in medication

## 2021-10-17 NOTE — Assessment & Plan Note (Signed)
persists for several months, refer to Dermatology

## 2021-10-28 ENCOUNTER — Encounter: Payer: Self-pay | Admitting: Family Medicine

## 2021-11-15 ENCOUNTER — Other Ambulatory Visit: Payer: Self-pay | Admitting: Family Medicine

## 2021-12-04 ENCOUNTER — Other Ambulatory Visit: Payer: Self-pay | Admitting: Family Medicine

## 2021-12-14 ENCOUNTER — Other Ambulatory Visit: Payer: Self-pay | Admitting: Family Medicine

## 2021-12-14 DIAGNOSIS — A6004 Herpesviral vulvovaginitis: Secondary | ICD-10-CM

## 2021-12-20 DIAGNOSIS — M5010 Cervical disc disorder with radiculopathy, unspecified cervical region: Secondary | ICD-10-CM | POA: Diagnosis not present

## 2022-01-03 ENCOUNTER — Other Ambulatory Visit: Payer: Self-pay | Admitting: Family Medicine

## 2022-01-11 ENCOUNTER — Encounter: Payer: Self-pay | Admitting: Family Medicine

## 2022-01-11 ENCOUNTER — Ambulatory Visit (HOSPITAL_COMMUNITY): Payer: Medicaid Other

## 2022-01-13 ENCOUNTER — Other Ambulatory Visit: Payer: Self-pay

## 2022-01-13 DIAGNOSIS — J209 Acute bronchitis, unspecified: Secondary | ICD-10-CM

## 2022-01-13 MED ORDER — FLUTICASONE PROPIONATE 50 MCG/ACT NA SUSP: 2.0000 | Freq: Every day | NASAL | 6 refills | Status: DC

## 2022-01-13 MED ORDER — AZELASTINE HCL 0.1 % NA SOLN: 2.0000 | Freq: Two times a day (BID) | NASAL | 3 refills | Status: DC

## 2022-01-13 NOTE — Telephone Encounter (Signed)
Patient aware.

## 2022-01-16 DIAGNOSIS — M5010 Cervical disc disorder with radiculopathy, unspecified cervical region: Secondary | ICD-10-CM | POA: Diagnosis not present

## 2022-01-23 ENCOUNTER — Other Ambulatory Visit: Payer: Self-pay | Admitting: Family Medicine

## 2022-01-23 ENCOUNTER — Ambulatory Visit (HOSPITAL_COMMUNITY)
Admission: RE | Admit: 2022-01-23 | Discharge: 2022-01-23 | Disposition: A | Discharge status: Home / Self Care | Payer: Medicaid Other | Source: Ambulatory Visit | Attending: Family Medicine | Admitting: Family Medicine

## 2022-01-23 DIAGNOSIS — Z1231 Encounter for screening mammogram for malignant neoplasm of breast: Secondary | ICD-10-CM | POA: Diagnosis not present

## 2022-02-09 ENCOUNTER — Encounter: Payer: Self-pay | Admitting: Family Medicine

## 2022-02-15 ENCOUNTER — Ambulatory Visit (INDEPENDENT_AMBULATORY_CARE_PROVIDER_SITE_OTHER): Payer: Medicaid Other | Admitting: Family Medicine

## 2022-02-15 ENCOUNTER — Encounter: Payer: Self-pay | Admitting: Family Medicine

## 2022-02-15 VITALS — BP 133/87 | HR 95 | Ht 63.0 in | Wt 169.0 lb

## 2022-02-15 DIAGNOSIS — J3089 Other allergic rhinitis: Secondary | ICD-10-CM

## 2022-02-15 DIAGNOSIS — F411 Generalized anxiety disorder: Secondary | ICD-10-CM | POA: Diagnosis not present

## 2022-02-15 DIAGNOSIS — E782 Mixed hyperlipidemia: Secondary | ICD-10-CM

## 2022-02-15 DIAGNOSIS — R058 Other specified cough: Secondary | ICD-10-CM | POA: Diagnosis not present

## 2022-02-15 DIAGNOSIS — E559 Vitamin D deficiency, unspecified: Secondary | ICD-10-CM

## 2022-02-15 DIAGNOSIS — I10 Essential (primary) hypertension: Secondary | ICD-10-CM

## 2022-02-15 DIAGNOSIS — R7301 Impaired fasting glucose: Secondary | ICD-10-CM

## 2022-02-15 MED ORDER — GABAPENTIN 100 MG PO CAPS: ORAL_CAPSULE | ORAL | 5 refills | Status: AC

## 2022-02-15 MED ORDER — BENZONATATE 100 MG PO CAPS: 100.0000 mg | ORAL_CAPSULE | Freq: Two times a day (BID) | ORAL | 0 refills | Status: DC | PRN

## 2022-02-15 MED ORDER — LEVOCETIRIZINE DIHYDROCHLORIDE 5 MG PO TABS: 5.0000 mg | ORAL_TABLET | Freq: Every evening | ORAL | 5 refills | Status: DC

## 2022-02-15 MED ORDER — PREDNISONE 5 MG (21) PO TBPK: 5.0000 mg | ORAL_TABLET | ORAL | 0 refills | Status: DC

## 2022-02-15 NOTE — Patient Instructions (Addendum)
Please change Annual with pap to an Aayushi appt, cancel the Feb appt  Meds are sent in, xyzal, prednisone and tessalon perles. Stop allegra   Gabapentin is prescribed  Fasting CBC, lipid, cmp and EGFr, tSH, Vit D and HBA1C 3 to 5 days before Sve[facility] appt  It is important that you exercise regularly at least 30 minutes 5 times a week. If you develop chest pain, have severe difficulty breathing, or feel very tired, stop exercising immediately and seek medical attention  Thanks for choosing [facility], we consider it a privelige to serve you.

## 2022-02-16 ENCOUNTER — Ambulatory Visit: Payer: Medicaid Other | Admitting: Family Medicine

## 2022-02-20 DIAGNOSIS — E559 Vitamin D deficiency, unspecified: Secondary | ICD-10-CM | POA: Insufficient documentation

## 2022-02-20 DIAGNOSIS — R058 Other specified cough: Secondary | ICD-10-CM | POA: Insufficient documentation

## 2022-02-20 NOTE — Assessment & Plan Note (Signed)
Allergy based, tessalon perles and prednisone prescribed

## 2022-02-20 NOTE — Assessment & Plan Note (Signed)
Updated lab needed at/ before next visit.   

## 2022-02-20 NOTE — Assessment & Plan Note (Signed)
Hyperlipidemia:Low fat diet discussed and encouraged.   Lipid Panel  Lab Results  Component Value Date   CHOL 147 10/07/2021   HDL 42 10/07/2021   LDLCALC 89 10/07/2021   TRIG 85 10/07/2021   CHOLHDL 3.5 10/07/2021     Updated lab needed at/ before next visit. Controlled when last checked

## 2022-02-20 NOTE — Assessment & Plan Note (Signed)
Controlled, no change in medication  

## 2022-02-20 NOTE — Assessment & Plan Note (Signed)
Unconrolled , stop allegra and add xyzall, short course prednisone prescribed and tessalon perles

## 2022-02-20 NOTE — Assessment & Plan Note (Signed)
Patient educated about the importance of limiting  Carbohydrate intake , the need to commit to daily physical activity for a minimum of 30 minutes , and to commit weight loss. The fact that changes in all these areas will reduce or eliminate all together the development of diabetes is stressed.      Latest Ref Rng & Units 10/07/2021    8:04 AM 03/11/2021    8:32 AM 08/24/2020    9:19 AM 08/20/2020    8:51 AM 12/25/2019    8:09 AM  Diabetic Labs  HbA1c 4.0 - 5.6 %   5.5     Chol 100 - 199 mg/dL 147  150   185  201   HDL >39 mg/dL 42  43   52  46   Calc LDL 0 - 99 mg/dL 89  89   121  139   Triglycerides 0 - 149 mg/dL 85  97   65  90   Creatinine 0.57 - 1.00 mg/dL 1.06  0.93   0.89  1.03       02/15/2022    3:59 PM 10/12/2021    8:58 AM 09/29/2021    1:48 PM 05/12/2021    9:14 AM 03/03/2021    9:24 AM 02/10/2021    3:43 PM 11/26/2020    9:30 AM  BP/Weight  Systolic BP Q000111Q [id number] Q000111Q Q000111Q 123XX123 0000000 Q000111Q  Diastolic BP 87 83 87 81 83 82 78  Wt. (Lbs) 169 172 171 175.08 174 174 174.04  BMI 29.94 kg/m2 30.47 kg/m2 30.29 kg/m2 30.05 kg/m2 30.82 kg/m2 30.82 kg/m2 30.83 kg/m2       No data to display          Updated lab needed at/ before next visit.

## 2022-02-20 NOTE — Progress Notes (Signed)
Catherine Allen     MRN: [id number]      DOB: 03/16/1975   HPI Catherine Allen is here for follow up and re-evaluation of chronic medical conditions, medication management and review of any available recent lab and radiology data.  Preventive health is updated, specifically  Cancer screening and Immunization.   Cough with excess nasal drainage which is clear since 02/04/2022, feels as though allergy med  no longer effective, no fever, chills or discolored sputum   ROS  Denies chest pains, palpitations and leg swelling Denies abdominal pain, nausea, vomiting,diarrhea or constipation.   Denies dysuria, frequency, hesitancy or incontinence. Denies joint pain, swelling and limitation in mobility. Denies headaches, seizures, numbness, or tingling. Denies depression, anxiety or insomnia. Denies skin break down or rash.   PE  BP 133/87 (BP Location: Right Arm, Patient Position: Sitting, Cuff Size: Large)   Pulse 95   Ht 5' 3"$  (1.6 m)   Wt 169 lb (76.7 kg)   SpO2 95%   BMI 29.94 kg/m   Patient alert and oriented and in no cardiopulmonary distress.  HEENT: No facial asymmetry, EOMI,     Neck supple .No sinus tenderness, positive nasal congestion  Chest: Clear to auscultation bilaterally.  CVS: S1, S2 no murmurs, no S3.Regular rate.  ABD: Soft non tender.   Ext: No edema  MS: Adequate ROM spine, shoulders, hips and knees.  Skin: Intact, no ulcerations or rash noted.  Psych: Good eye contact, normal affect. Memory intact not anxious or depressed appearing.  CNS: CN 2-12 intact, power,  normal throughout.no focal deficits noted.   Assessment & Plan  Allergic rhinitis Unconrolled , stop allegra and add xyzall, short course prednisone prescribed and tessalon perles   Cough productive of clear sputum Allergy based, tessalon perles and prednisone prescribed  Hyperlipemia Hyperlipidemia:Low fat diet discussed and encouraged.   Lipid Panel  Lab Results  Component Value  Date   CHOL 147 10/07/2021   HDL 42 10/07/2021   LDLCALC 89 10/07/2021   TRIG 85 10/07/2021   CHOLHDL 3.5 10/07/2021     Updated lab needed at/ before next visit. Controlled when last checked  Essential hypertension Controlled, no change in medication DASH diet and commitment to daily physical activity for a minimum of 30 minutes discussed and encouraged, as a part of hypertension management. The importance of attaining a healthy weight is also discussed.     02/15/2022    3:59 PM 10/12/2021    8:58 AM 09/29/2021    1:48 PM 05/12/2021    9:14 AM 03/03/2021    9:24 AM 02/10/2021    3:43 PM 11/26/2020    9:30 AM  BP/Weight  Systolic BP Q000111Q [id number] Q000111Q Q000111Q 123XX123 0000000 Q000111Q  Diastolic BP 87 83 87 81 83 82 78  Wt. (Lbs) 169 172 171 175.08 174 174 174.04  BMI 29.94 kg/m2 30.47 kg/m2 30.29 kg/m2 30.05 kg/m2 30.82 kg/m2 30.82 kg/m2 30.83 kg/m2       IFG (impaired fasting glucose) Patient educated about the importance of limiting  Carbohydrate intake , the need to commit to daily physical activity for a minimum of 30 minutes , and to commit weight loss. The fact that changes in all these areas will reduce or eliminate all together the development of diabetes is stressed.      Latest Ref Rng & Units 10/07/2021    8:04 AM 03/11/2021    8:32 AM 08/24/2020    9:19 AM 08/20/2020    8:51 AM  12/25/2019    8:09 AM  Diabetic Labs  HbA1c 4.0 - 5.6 %   5.5     Chol 100 - 199 mg/dL 147  150   185  201   HDL >39 mg/dL 42  43   52  46   Calc LDL 0 - 99 mg/dL 89  89   121  139   Triglycerides 0 - 149 mg/dL 85  97   65  90   Creatinine 0.57 - 1.00 mg/dL 1.06  0.93   0.89  1.03       02/15/2022    3:59 PM 10/12/2021    8:58 AM 09/29/2021    1:48 PM 05/12/2021    9:14 AM 03/03/2021    9:24 AM 02/10/2021    3:43 PM 11/26/2020    9:30 AM  BP/Weight  Systolic BP Q000111Q [id number] Q000111Q Q000111Q 123XX123 0000000 Q000111Q  Diastolic BP 87 83 87 81 83 82 78  Wt. (Lbs) 169 172 171 175.08 174 174 174.04  BMI 29.94 kg/m2 30.47 kg/m2 30.29 kg/m2  30.05 kg/m2 30.82 kg/m2 30.82 kg/m2 30.83 kg/m2       No data to display          Updated lab needed at/ before next visit.   GAD (generalized anxiety disorder) Controlled, no change in medication   Vitamin D deficiency Updated lab needed at/ before next visit.

## 2022-02-20 NOTE — Assessment & Plan Note (Signed)
Controlled, no change in medication DASH diet and commitment to daily physical activity for a minimum of 30 minutes discussed and encouraged, as a part of hypertension management. The importance of attaining a healthy weight is also discussed.     02/15/2022    3:59 PM 10/12/2021    8:58 AM 09/29/2021    1:48 PM 05/12/2021    9:14 AM 03/03/2021    9:24 AM 02/10/2021    3:43 PM 11/26/2020    9:30 AM  BP/Weight  Systolic BP Q000111Q [id number] Q000111Q Q000111Q 123XX123 0000000 Q000111Q  Diastolic BP 87 83 87 81 83 82 78  Wt. (Lbs) 169 172 171 175.08 174 174 174.04  BMI 29.94 kg/m2 30.47 kg/m2 30.29 kg/m2 30.05 kg/m2 30.82 kg/m2 30.82 kg/m2 30.83 kg/m2

## 2022-02-21 ENCOUNTER — Other Ambulatory Visit: Payer: Self-pay | Admitting: Family Medicine

## 2022-02-27 DIAGNOSIS — M5412 Radiculopathy, cervical region: Secondary | ICD-10-CM | POA: Diagnosis not present

## 2022-03-07 ENCOUNTER — Encounter: Payer: Medicaid Other | Admitting: Family Medicine

## 2022-03-09 ENCOUNTER — Encounter: Payer: Self-pay | Admitting: Radiology

## 2022-03-17 ENCOUNTER — Ambulatory Visit
Admission: RE | Admit: 2022-03-17 | Discharge: 2022-03-17 | Disposition: A | Discharge status: Home / Self Care | Payer: Medicaid Other | Source: Ambulatory Visit | Attending: Family Medicine | Admitting: Family Medicine

## 2022-03-17 VITALS — BP 148/85 | HR 91 | Temp 98.2°F | Resp 18

## 2022-03-17 DIAGNOSIS — N760 Acute vaginitis: Secondary | ICD-10-CM | POA: Diagnosis not present

## 2022-03-17 HISTORY — DX: Herpesviral infection, unspecified: B00.9

## 2022-03-17 MED ORDER — FLUCONAZOLE 150 MG PO TABS: 150.0000 mg | ORAL_TABLET | ORAL | 0 refills | Status: DC

## 2022-03-17 NOTE — ED Triage Notes (Signed)
Vaginal irritation and itching x 1 week.  Did have some light color discharge a few days ago.  States she does have a new sexual partner.

## 2022-03-17 NOTE — ED Provider Notes (Signed)
[facility]    CSN: [id number] Arrival date & time: 03/17/22  1249      History   Chief Complaint Chief Complaint  Patient presents with   Vaginal Itching    Entered by patient    HPI Catherine Allen is a 47 y.o. female.   Patient presenting today with 1 week history of vaginal itching, irritation, light-colored discharge.  Denies pelvic or abdominal pain, fever, nausea, vomiting.  Does have a new sexual partner, no new soaps or medications.  Trying over-the-counter Monistat since yesterday with minimal relief.  She states she does also have genital herpes but has started her Valtrex for flareup and has not provided any relief.    Past Medical History:  Diagnosis Date   Allergy    Anxiety    [employer] 07/26/2019   Dyspareunia    Hemorrhoids    Herpes    HLD (hyperlipidemia)    HTN (hypertension)    Hypertension    [employer] 07/26/2019   MRSA (methicillin resistant Staphylococcus aureus) 2010   boils thighs   S/P endoscopy 06/2010   Dr. Gala Romney: mild erosive esophagitis, chronic duodenitis, mild gastritis, stop NSAIDs.    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      Home Medications    Prior to Admission medications   Medication Sig Start Date End Date Taking? Authorizing Provider  fluconazole (DIFLUCAN) 150 MG tablet Take 1 tablet (150 mg total) by mouth every other day. 03/17/22  Yes Volney American, PA-C  acyclovir (ZOVIRAX) 400 MG tablet TAKE 1 TABLET BY MOUTH TWICE A DAY 12/14/21   Fayrene Helper, MD  amLODipine (NORVASC) 10 MG tablet TAKE 1 TABLET BY MOUTH EVERY DAY 01/03/22   Fayrene Helper, MD  benzonatate (TESSALON) 100 MG capsule Take 1 capsule (100 mg total) by mouth 2 (two) times daily as needed for cough. 02/15/22   Fayrene Helper, MD  busPIRone (BUSPAR) 7.5 MG tablet TAKE 1 TABLET BY MOUTH THREE TIMES A DAY 10/05/21   Fayrene Helper, MD  fluticasone [facility]) 50 MCG/ACT nasal spray Place 2 sprays into both nostrils daily. 01/13/22   Fayrene Helper, MD  gabapentin (NEURONTIN) 100 MG capsule Take two capsules at bedtime for pain 02/15/22   Fayrene Helper, MD  levocetirizine (XYZAL) 5 MG  tablet Take 1 tablet (5 mg total) by mouth every evening. 02/15/22   Fayrene Helper, MD  Levonorgest-Eth Est & Eth Est 42-21-21-7 DAYS TABS TAKE 1 TABLET BY MOUTH EVERY DAY 02/21/22   Fayrene Helper, MD  LORazepam (ATIVAN) 1 MG tablet Take one tablet by mouth once daily , as needed, for extreme anxiety or panic 11/10/21   Fayrene Helper, MD  montelukast (SINGULAIR) 10 MG tablet TAKE 1 TABLET BY MOUTH EVERYDAY AT BEDTIME 09/26/21   Fayrene Helper, MD  pantoprazole (PROTONIX) 40 MG tablet Take 1 tablet (40 mg total) by mouth daily before breakfast. 09/29/21   Sherron Monday, NP  potassium  chloride (KLOR-CON) 10 MEQ tablet TAKE 1 TABLET BY MOUTH 2 TIMES DAILY. 11/16/21   Fayrene Helper, MD  predniSONE (STERAPRED UNI-PAK 21 TAB) 5 MG (21) TBPK tablet Take 1 tablet (5 mg total) by mouth as directed. Use as directed 02/15/22   Fayrene Helper, MD  rosuvastatin (CRESTOR) 20 MG tablet TAKE 1 TABLET BY MOUTH EVERY DAY STOP PRAVASTATIN 12/05/21   Fayrene Helper, MD  triamterene-hydrochlorothiazide (MAXZIDE) 75-50 MG tablet TAKE 1 TABLET BY MOUTH EVERY DAY 12/05/21   Fayrene Helper, MD    Family History Family History  Problem Relation Age of Onset   Hypertension Mother    Asthma Sister    Breast cancer Cousin    Colon cancer Neg Hx     Social History Social History   Tobacco Use   Smoking status: Never   Smokeless tobacco: Never  Substance Use Topics   Alcohol use: No   Drug use: No     Allergies   Patient has no known allergies.   Review of Systems Review of Systems PER HPI  Physical Exam Triage Vital Signs ED Triage Vitals  Enc Vitals Group     BP 03/17/22 1306 (!) 148/85     Pulse Rate 03/17/22 1306 91     Resp 03/17/22 1306 18     Temp 03/17/22 1306 98.2 F (36.8 C)     Temp Source 03/17/22 1306 Oral     SpO2 03/17/22 1306 98 %     Weight --      Height --      Head Circumference --      Peak Flow --      Pain Score 03/17/22 1307 0     Pain Loc --      Pain Edu? --      Excl. in [place]? --    No data found.  Updated Vital Signs BP (!) 148/85 (BP Location: Right Arm)   Pulse 91   Temp 98.2 F (36.8 C) (Oral)   Resp 18   SpO2 98%   Visual Acuity Right Eye Distance:   Left Eye Distance:   Bilateral Distance:    Right Eye Near:   Left Eye Near:    Bilateral Near:     Physical Exam Vitals and nursing note reviewed.  Constitutional:      Appearance: Normal appearance. She is not ill-appearing.  HENT:     Head: Atraumatic.  Eyes:     Extraocular Movements: Extraocular movements intact.     Conjunctiva/sclera:  Conjunctivae normal.  Cardiovascular:     Rate and Rhythm: Normal rate and regular rhythm.     Heart sounds: Normal heart sounds.  Pulmonary:     Effort: Pulmonary effort is normal.     Breath sounds: Normal breath sounds.  Abdominal:  General: Bowel sounds are normal. There is no distension.     Palpations: Abdomen is soft.     Tenderness: There is no abdominal tenderness. There is no guarding.  Genitourinary:    Comments: GU exam deferred, self swab performed Musculoskeletal:        General: Normal range of motion.     Cervical back: Normal range of motion and neck supple.  Skin:    General: Skin is warm and dry.  Neurological:     Mental Status: She is alert and oriented to person, place, and time.  Psychiatric:        Mood and Affect: Mood normal.        Thought Content: Thought content normal.        Judgment: Judgment normal.      UC Treatments / Results  Labs (all labs ordered are listed, but only abnormal results are displayed) Labs Reviewed  CERVICOVAGINAL ANCILLARY ONLY    EKG   Radiology No results found.  Procedures Procedures (including critical care time)  Medications Ordered in UC Medications - No data to display  Initial Impression / Assessment and Plan / UC Course  I have reviewed the triage vital signs and the nursing notes.  Pertinent labs & imaging results that were available during my care of the patient were reviewed by me and considered in my medical decision making (see chart for details).     Vaginal swab pending, treat with Diflucan while awaiting results and adjust if needed.  Return for worsening symptoms.  Final Clinical Impressions(s) / UC Diagnoses   Final diagnoses:  Acute vaginitis   Discharge Instructions   None    ED Prescriptions     Medication Sig Dispense Auth. Provider   fluconazole (DIFLUCAN) 150 MG tablet Take 1 tablet (150 mg total) by mouth every other day. 3 tablet Volney American, [place]       PDMP not reviewed this encounter.   Volney American, [place] 03/17/22 1333

## 2022-03-20 LAB — CERVICOVAGINAL ANCILLARY ONLY
Bacterial Vaginitis (gardnerella): NEGATIVE
Candida Glabrata: NEGATIVE
Candida Vaginitis: POSITIVE — AB
Chlamydia: NEGATIVE
Comment: NEGATIVE
Comment: NEGATIVE
Comment: NEGATIVE
Comment: NEGATIVE
Comment: NEGATIVE
Comment: NORMAL
Neisseria Gonorrhea: NEGATIVE
Trichomonas: NEGATIVE

## 2022-04-11 ENCOUNTER — Ambulatory Visit
Admission: RE | Admit: 2022-04-11 | Discharge: 2022-04-11 | Disposition: A | Discharge status: Home / Self Care | Payer: Medicaid Other | Source: Ambulatory Visit | Attending: Family Medicine | Admitting: Family Medicine

## 2022-04-11 VITALS — BP 143/80 | HR 78 | Temp 98.2°F | Resp 18

## 2022-04-11 DIAGNOSIS — N760 Acute vaginitis: Secondary | ICD-10-CM

## 2022-04-11 MED ORDER — FLUCONAZOLE 150 MG PO TABS: 150.0000 mg | ORAL_TABLET | ORAL | 0 refills | Status: DC

## 2022-04-11 NOTE — Discharge Instructions (Signed)
You may try a female health probiotic and/or vaginal boric acid suppositories to help balance your vaginal flora

## 2022-04-11 NOTE — ED Provider Notes (Signed)
[facility]    CSN: [id number] Arrival date & time: 04/11/22  1539      History   Chief Complaint Chief Complaint  Patient presents with   Vaginal Itching    Entered by patient    HPI Catherine Allen is a 47 y.o. female.   Patient presents today with 2-day history of vaginal irritation, itching, white discharge.  Denies pelvic or abdominal pain, dysuria, hematuria, rashes or lesions, flank pain.  So far not trying anything over-the-counter for symptoms.  States she had similar symptoms several months ago and had a yeast infection when tested.  She does have a new sexual partner but states the use condoms each time.    Past Medical History:  Diagnosis Date   Allergy    Anxiety    [employer] 07/26/2019   Dyspareunia    Hemorrhoids    Herpes    HLD (hyperlipidemia)    HTN (hypertension)    Hypertension    [employer] 07/26/2019   MRSA (methicillin resistant Staphylococcus aureus) 2010   boils thighs   S/P endoscopy 06/2010   Dr. Gala Romney: mild erosive esophagitis, chronic duodenitis, mild gastritis, stop NSAIDs.    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      Home Medications    Prior to Admission medications   Medication Sig Start Date End Date Taking? Authorizing Provider  acyclovir (ZOVIRAX) 400 MG tablet TAKE 1 TABLET BY MOUTH TWICE A DAY 12/14/21   Fayrene Helper, MD  amLODipine (NORVASC) 10 MG tablet TAKE 1 TABLET BY MOUTH EVERY DAY 01/03/22   Fayrene Helper, MD  benzonatate (TESSALON) 100 MG capsule Take 1 capsule (100 mg total) by mouth 2 (two) times daily as needed for cough. 02/15/22   Fayrene Helper, MD  busPIRone (BUSPAR) 7.5 MG tablet TAKE 1 TABLET BY MOUTH THREE TIMES A DAY 10/05/21   Fayrene Helper, MD  fluconazole (DIFLUCAN) 150 MG tablet Take 1 tablet (150 mg total) by mouth every other day. 04/11/22   Volney American, PA-C  fluticasone [facility]) 50 MCG/ACT nasal spray Place 2 sprays into both nostrils daily. 01/13/22   Fayrene Helper, MD  gabapentin (NEURONTIN) 100 MG capsule Take two capsules at bedtime for pain 02/15/22   Fayrene Helper, MD  levocetirizine (XYZAL) 5 MG  tablet Take 1 tablet (5 mg total) by mouth every evening. 02/15/22   Fayrene Helper, MD  Levonorgest-Eth Est & Eth Est 42-21-21-7 DAYS TABS TAKE 1 TABLET BY MOUTH EVERY DAY 02/21/22   Fayrene Helper, MD  LORazepam (ATIVAN) 1 MG tablet Take one tablet by mouth once daily , as needed, for extreme anxiety or panic 11/10/21   Fayrene Helper, MD  montelukast (SINGULAIR) 10 MG tablet TAKE 1 TABLET BY MOUTH EVERYDAY AT BEDTIME 09/26/21   Fayrene Helper, MD  pantoprazole (PROTONIX) 40 MG tablet Take 1 tablet (40 mg total) by mouth daily before breakfast. 09/29/21   Sherron Monday, NP  potassium  chloride (KLOR-CON) 10 MEQ tablet TAKE 1 TABLET BY MOUTH 2 TIMES DAILY. 11/16/21   Fayrene Helper, MD  predniSONE (STERAPRED UNI-PAK 21 TAB) 5 MG (21) TBPK tablet Take 1 tablet (5 mg total) by mouth as directed. Use as directed 02/15/22   Fayrene Helper, MD  rosuvastatin (CRESTOR) 20 MG tablet TAKE 1 TABLET BY MOUTH EVERY DAY STOP PRAVASTATIN 12/05/21   Fayrene Helper, MD  triamterene-hydrochlorothiazide (MAXZIDE) 75-50 MG tablet TAKE 1 TABLET BY MOUTH EVERY DAY 12/05/21   Fayrene Helper, MD    Family History Family History  Problem Relation Age of Onset   Hypertension Mother    Asthma Sister    Breast cancer Cousin    Colon cancer Neg Hx     Social History Social History   Tobacco Use   Smoking status: Never   Smokeless tobacco: Never  Substance Use Topics   Alcohol use: No   Drug use: No     Allergies   Patient has no known allergies.   Review of Systems Review of Systems PER HPI  Physical Exam Triage Vital Signs ED Triage Vitals  Enc Vitals Group     BP 04/11/22 1550 (!) 143/80     Pulse Rate 04/11/22 1550 78     Resp 04/11/22 1550 18     Temp 04/11/22 1550 98.2 F (36.8 C)     Temp Source 04/11/22 1550 Oral     SpO2 04/11/22 1550 97 %     Weight --      Height --      Head Circumference --      Peak Flow --      Pain Score 04/11/22 1551 0     Pain Loc --      Pain Edu? --      Excl. in [place]? --    No data found.  Updated Vital Signs BP (!) 143/80 (BP Location: Right Arm)   Pulse 78   Temp 98.2 F (36.8 C) (Oral)   Resp 18   SpO2 97%   Visual Acuity Right Eye Distance:   Left Eye Distance:   Bilateral Distance:    Right Eye Near:   Left Eye Near:    Bilateral Near:     Physical Exam Vitals and nursing note reviewed.  Constitutional:      Appearance: Normal appearance. She is not ill-appearing.  HENT:     Head: Atraumatic.  Eyes:     Extraocular Movements: Extraocular movements intact.     Conjunctiva/sclera:  Conjunctivae normal.  Cardiovascular:     Rate and Rhythm: Normal rate and regular rhythm.     Heart sounds: Normal heart sounds.  Pulmonary:     Effort: Pulmonary effort is normal.     Breath sounds: Normal breath sounds.  Abdominal:  General: Bowel sounds are normal. There is no distension.     Palpations: Abdomen is soft.     Tenderness: There is no abdominal tenderness. There is no guarding.  Genitourinary:    Comments: GU exam deferred, self swab performed Musculoskeletal:        General: Normal range of motion.     Cervical back: Normal range of motion and neck supple.  Skin:    General: Skin is warm and dry.  Neurological:     Mental Status: She is alert and oriented to person, place, and time.  Psychiatric:        Mood and Affect: Mood normal.        Thought Content: Thought content normal.        Judgment: Judgment normal.      UC Treatments / Results  Labs (all labs ordered are listed, but only abnormal results are displayed) Labs Reviewed  CERVICOVAGINAL ANCILLARY ONLY    EKG   Radiology No results found.  Procedures Procedures (including critical care time)  Medications Ordered in UC Medications - No data to display  Initial Impression / Assessment and Plan / UC Course  I have reviewed the triage vital signs and the nursing notes.  Pertinent labs & imaging results that were available during my care of the patient were reviewed by me and considered in my medical decision making (see chart for details).     Vaginal swab pending, given history of yeast infections will treat with Diflucan while awaiting results and adjust if needed.  Discussed supportive over-the-counter medications, home care additionally.  Return for worsening symptoms.  Final Clinical Impressions(s) / UC Diagnoses   Final diagnoses:  Acute vaginitis     Discharge Instructions      You may try a female health probiotic and/or vaginal boric acid suppositories to help  balance your vaginal flora    ED Prescriptions     Medication Sig Dispense Auth. Provider   fluconazole (DIFLUCAN) 150 MG tablet Take 1 tablet (150 mg total) by mouth every other day. 3 tablet Volney American, [place]      PDMP not reviewed this encounter.   Volney American, [place] 04/11/22 [phone]

## 2022-04-11 NOTE — ED Triage Notes (Signed)
Pt reports she is having vaginal burning, itching, and off white discharge x 2 days.  Pt has a new sexual partner, but they use condoms each time.

## 2022-04-12 LAB — CERVICOVAGINAL ANCILLARY ONLY
Bacterial Vaginitis (gardnerella): NEGATIVE
Candida Glabrata: NEGATIVE
Candida Vaginitis: POSITIVE — AB
Comment: NEGATIVE
Comment: NEGATIVE
Comment: NEGATIVE

## 2022-04-13 ENCOUNTER — Other Ambulatory Visit: Payer: Self-pay | Admitting: Family Medicine

## 2022-04-13 ENCOUNTER — Telehealth (HOSPITAL_COMMUNITY): Payer: Self-pay

## 2022-04-13 DIAGNOSIS — A6004 Herpesviral vulvovaginitis: Secondary | ICD-10-CM

## 2022-04-26 ENCOUNTER — Encounter: Payer: Medicaid Other | Admitting: Family Medicine

## 2022-04-29 ENCOUNTER — Other Ambulatory Visit: Payer: Self-pay | Admitting: Family Medicine

## 2022-05-03 DIAGNOSIS — E559 Vitamin D deficiency, unspecified: Secondary | ICD-10-CM | POA: Diagnosis not present

## 2022-05-03 DIAGNOSIS — E782 Mixed hyperlipidemia: Secondary | ICD-10-CM | POA: Diagnosis not present

## 2022-05-03 DIAGNOSIS — I10 Essential (primary) hypertension: Secondary | ICD-10-CM | POA: Diagnosis not present

## 2022-05-03 DIAGNOSIS — R7301 Impaired fasting glucose: Secondary | ICD-10-CM | POA: Diagnosis not present

## 2022-05-04 LAB — CBC
Hematocrit: 41.6 % (ref 34.0–46.6)
Hemoglobin: 13.3 g/dL (ref 11.1–15.9)
MCH: 28 pg (ref 26.6–33.0)
MCHC: 32 g/dL (ref 31.5–35.7)
MCV: 88 fL (ref 79–97)
Platelets: 355 10*3/uL (ref 150–450)
RBC: 4.75 x10E6/uL (ref 3.77–5.28)
RDW: 12.1 % (ref 11.7–15.4)
WBC: 8.4 10*3/uL (ref 3.4–10.8)

## 2022-05-04 LAB — CMP14+EGFR
ALT: 16 IU/L (ref 0–32)
AST: 17 IU/L (ref 0–40)
Albumin/Globulin Ratio: 1.8 (ref 1.2–2.2)
Albumin: 4 g/dL (ref 3.9–4.9)
Alkaline Phosphatase: 71 IU/L (ref 44–121)
BUN/Creatinine Ratio: 9 (ref 9–23)
BUN: 9 mg/dL (ref 6–24)
Bilirubin Total: 0.5 mg/dL (ref 0.0–1.2)
CO2: 21 mmol/L (ref 20–29)
Calcium: 8.9 mg/dL (ref 8.7–10.2)
Chloride: 104 mmol/L (ref 96–106)
Creatinine, Ser: 1 mg/dL (ref 0.57–1.00)
Globulin, Total: 2.2 g/dL (ref 1.5–4.5)
Glucose: 87 mg/dL (ref 70–99)
Potassium: 3.8 mmol/L (ref 3.5–5.2)
Sodium: 139 mmol/L (ref 134–144)
Total Protein: 6.2 g/dL (ref 6.0–8.5)
eGFR: 70 mL/min/{1.73_m2} (ref >59)

## 2022-05-04 LAB — VITAMIN D 25 HYDROXY (VIT D DEFICIENCY, FRACTURES): Vit D, 25-Hydroxy: 54.9 ng/mL (ref 30.0–100.0)

## 2022-05-04 LAB — TSH: TSH: 3.07 u[IU]/mL (ref 0.450–4.500)

## 2022-05-04 LAB — LIPID PANEL
Chol/HDL Ratio: 3.2 ratio (ref 0.0–4.4)
Cholesterol, Total: 138 mg/dL (ref 100–199)
HDL: 43 mg/dL (ref >39)
LDL Chol Calc (NIH): 81 mg/dL (ref 0–99)
Triglycerides: 70 mg/dL (ref 0–149)
VLDL Cholesterol Cal: 14 mg/dL (ref 5–40)

## 2022-05-04 LAB — HEMOGLOBIN A1C
Est. average glucose Bld gHb Est-mCnc: 91 mg/dL
Hgb A1c MFr Bld: 4.8 % (ref 4.8–5.6)

## 2022-05-05 ENCOUNTER — Encounter: Payer: Self-pay | Admitting: Family Medicine

## 2022-05-05 ENCOUNTER — Ambulatory Visit (INDEPENDENT_AMBULATORY_CARE_PROVIDER_SITE_OTHER): Payer: Medicaid Other | Admitting: Family Medicine

## 2022-05-05 VITALS — BP 127/82 | HR 100 | Ht 63.0 in | Wt 173.0 lb

## 2022-05-05 DIAGNOSIS — B3731 Acute candidiasis of vulva and vagina: Secondary | ICD-10-CM | POA: Diagnosis not present

## 2022-05-05 DIAGNOSIS — Z0000 Encounter for general adult medical examination without abnormal findings: Secondary | ICD-10-CM

## 2022-05-05 MED ORDER — FLUCONAZOLE 150 MG PO TABS: ORAL_TABLET | ORAL | 1 refills | Status: DC

## 2022-05-05 MED ORDER — LORAZEPAM 1 MG PO TABS: ORAL_TABLET | ORAL | 5 refills | Status: DC

## 2022-05-05 NOTE — Assessment & Plan Note (Signed)
Annual exam as documented. Counseling done  re healthy lifestyle involving commitment to 150 minutes exercise per week, heart healthy diet, and attaining healthy weight.The importance of adequate sleep also discussed. Changes in health habits are decided on by the patient with goals and time frames  set for achieving them. Immunization and cancer screening needs are specifically addressed at this visit. 

## 2022-05-05 NOTE — Assessment & Plan Note (Signed)
Recurrent with sexual activity which is recent Fluconazole as needed, once / week, continue daily Azo

## 2022-05-05 NOTE — Patient Instructions (Signed)
F/U end October, call if you need me sooner  Excellent labs!  New is once weekly fluconazole as needed, as we discussed  It is important that you exercise regularly at least 30 minutes 5 times a week. If you develop chest pain, have severe difficulty breathing, or feel very tired, stop exercising immediately and seek medical attention  Think about what you will eat, plan ahead. Choose " clean, green, fresh or frozen" over canned, processed or packaged foods which are more sugary, salty and fatty. 70 to 75% of food eaten should be vegetables and fruit. Three meals at set times with snacks allowed between meals, but they must be fruit or vegetables. Aim to eat over a 12 hour period , example 7 am to 7 pm, and STOP after  your last meal of the day. Drink water,generally about 64 ounces per day, no other drink is as healthy. Fruit juice is best enjoyed in a healthy way, by EATING the fruit.  Thanks for choosing [facility], we consider it a privelige to serve you.

## 2022-05-05 NOTE — Progress Notes (Signed)
    Catherine Allen     MRN: [id number]      DOB: 09-12-1975  Chief Complaint  Patient presents with   Annual Exam    CPE, recurrent yeast infections seen at urgent care     HPI: Patient is in for annual physical exam. Recent labs,  are reviewed. Immunization is reviewed , and  updated if needed.   PE: BP 127/82 (BP Location: Right Arm, Patient Position: Sitting, Cuff Size: Normal)   Pulse 100   Ht 5\' 3"  (1.6 m)   Wt 173 lb (78.5 kg)   SpO2 96%   BMI 30.65 kg/m   Pleasant  female, alert and oriented x 3, in no cardio-pulmonary distress. Afebrile. HEENT No facial trauma or asymetry. Sinuses non tender.  Extra occullar muscles intact.. External ears normal, . Neck: supple, no adenopathy,JVD or thyromegaly.No bruits.  Chest: Clear to ascultation bilaterally.No crackles or wheezes. Non tender to palpation  Cardiovascular system; Heart sounds normal,  S1 and  S2 ,no S3.  No murmur, or thrill.  Peripheral pulses normal.  Abdomen: Soft, non tender, no organomegaly or masses. No bruits. Bowel sounds normal. No guarding, tenderness or rebound.    Musculoskeletal exam: Full ROM of spine, hips , shoulders and knees. No deformity ,swelling or crepitus noted. No muscle wasting or atrophy.   Neurologic: Cranial nerves 2 to 12 intact. Power, tone ,sensation and reflexes normal throughout. No disturbance in gait. No tremor.  Skin: Intact, no ulceration, erythema , scaling or rash noted.erythema with puncture site on right forearm Pigmentation normal throughout  Psych; Normal mood and affect. Judgement and concentration normal   Assessment & Plan:  Annual physical exam Annual exam as documented. Counseling done  re healthy lifestyle involving commitment to 150 minutes exercise per week, heart healthy diet, and attaining healthy weight.The importance of adequate sleep also discussed. Changes in health habits are decided on by the patient with goals and time frames   set for achieving them. Immunization and cancer screening needs are specifically addressed at this visit.   Vaginal candidosis Recurrent with sexual activity which is recent Fluconazole as needed, once / week, continue daily Azo

## 2022-05-19 ENCOUNTER — Other Ambulatory Visit: Payer: Self-pay | Admitting: Family Medicine

## 2022-05-19 ENCOUNTER — Ambulatory Visit (INDEPENDENT_AMBULATORY_CARE_PROVIDER_SITE_OTHER): Payer: Medicaid Other | Admitting: Family Medicine

## 2022-05-19 VITALS — BP 130/83 | HR 99 | Ht 63.0 in | Wt 173.0 lb

## 2022-05-19 DIAGNOSIS — J040 Acute laryngitis: Secondary | ICD-10-CM | POA: Diagnosis not present

## 2022-05-19 DIAGNOSIS — J309 Allergic rhinitis, unspecified: Secondary | ICD-10-CM | POA: Insufficient documentation

## 2022-05-19 DIAGNOSIS — R051 Acute cough: Secondary | ICD-10-CM

## 2022-05-19 DIAGNOSIS — I10 Essential (primary) hypertension: Secondary | ICD-10-CM | POA: Diagnosis not present

## 2022-05-19 MED ORDER — PROMETHAZINE-DM 6.25-15 MG/5ML PO SYRP
ORAL_SOLUTION | ORAL | 0 refills | Status: DC
Start: 2022-05-19 — End: 2022-05-30

## 2022-05-19 MED ORDER — CHLORPHENIRAMINE MALEATE 4 MG PO TABS: ORAL_TABLET | ORAL | 0 refills | Status: DC

## 2022-05-19 MED ORDER — PREDNISONE 10 MG PO TABS: ORAL_TABLET | ORAL | 0 refills | Status: DC

## 2022-05-19 NOTE — Progress Notes (Unsigned)
   Catherine Allen     MRN: [id number]      DOB: [birth year]  Chief Complaint  Patient presents with   Follow-up    Head and chest congestion losing voice since Wednesday,drainage.    HPI See CC  Catherine Allen is here with above symptoms, denies fever, chills ,body aches and green sputum   ROS Denies recent fever or chills. Denies sinus pressure, nasal congestion, ear pain or sore throat. Denies chest congestion, productive cough or wheezing. Denies chest pains, palpitations and leg swelling Denies abdominal pain, nausea, vomiting,diarrhea or constipation.   Denies dysuria, frequency, hesitancy or incontinence. Denies joint pain, swelling and limitation in mobility. Denies headaches, seizures, numbness, or tingling. Denies depression, anxiety or insomnia. Denies skin break down or rash.   PE  BP 130/83 (BP Location: Left Arm, Patient Position: Sitting, Cuff Size: Large)   Pulse 99   Ht 5\' 3"  (1.6 m)   Wt 173 lb 0.6 oz (78.5 kg)   SpO2 97%   BMI 30.65 kg/m   Patient alert and oriented and in no cardiopulmonary distress.  HEENT: No facial asymmetry, EOMI,     Neck supple .  Chest: Clear to auscultation bilaterally.  CVS: S1, S2 no murmurs, no S3.Regular rate.  ABD: Soft non tender.   Ext: No edema  MS: Adequate ROM spine, shoulders, hips and knees.  Skin: Intact, no ulcerations or rash noted.  Psych: Good eye contact, normal affect. Memory intact not anxious or depressed appearing.  CNS: CN 2-12 intact, power,  normal throughout.no focal deficits noted.   Assessment & Plan  No problem-specific Assessment & Plan notes found for this encounter.

## 2022-05-19 NOTE — Patient Instructions (Signed)
F/U as before, call if you need me sooner  You are treated for laryngitis and uncontrolled allergies  Prednisone, chlorpheniramine tablets, and phenergan DM syrup are prescribed  Commit to daily flonase and singulair please  Please rest your voice, minimal talking for the next 3 days to help it to recover!  Thanks for choosing [facility], we consider it a privelige to serve you.

## 2022-05-22 NOTE — Assessment & Plan Note (Signed)
Uncontrolled, short course of prednisone and chlorpheniramine, and needs to commit to daily singulair and flonase

## 2022-05-22 NOTE — Assessment & Plan Note (Signed)
Currently uncontrolled due to allergies, phenergan Dm short term, limited quantity

## 2022-05-22 NOTE — Assessment & Plan Note (Signed)
Controlled, no change in medication  

## 2022-05-22 NOTE — Assessment & Plan Note (Signed)
Voice rest and short course of prednisone 

## 2022-05-30 ENCOUNTER — Other Ambulatory Visit: Payer: Self-pay | Admitting: Family Medicine

## 2022-05-30 ENCOUNTER — Other Ambulatory Visit (HOSPITAL_COMMUNITY)
Admission: RE | Admit: 2022-05-30 | Discharge: 2022-05-30 | Disposition: A | Discharge status: Home / Self Care | Payer: Medicaid Other | Source: Ambulatory Visit | Attending: Family Medicine | Admitting: Family Medicine

## 2022-05-30 ENCOUNTER — Ambulatory Visit (INDEPENDENT_AMBULATORY_CARE_PROVIDER_SITE_OTHER): Payer: Medicaid Other | Admitting: Family Medicine

## 2022-05-30 ENCOUNTER — Encounter: Payer: Self-pay | Admitting: Family Medicine

## 2022-05-30 ENCOUNTER — Ambulatory Visit: Payer: Medicaid Other | Admitting: Family Medicine

## 2022-05-30 VITALS — BP 126/80 | HR 92 | Ht 64.0 in | Wt 172.1 lb

## 2022-05-30 DIAGNOSIS — N393 Stress incontinence (female) (male): Secondary | ICD-10-CM | POA: Diagnosis not present

## 2022-05-30 DIAGNOSIS — R32 Unspecified urinary incontinence: Secondary | ICD-10-CM | POA: Insufficient documentation

## 2022-05-30 DIAGNOSIS — N760 Acute vaginitis: Secondary | ICD-10-CM

## 2022-05-30 DIAGNOSIS — B3731 Acute candidiasis of vulva and vagina: Secondary | ICD-10-CM | POA: Diagnosis present

## 2022-05-30 DIAGNOSIS — A6004 Herpesviral vulvovaginitis: Secondary | ICD-10-CM

## 2022-05-30 DIAGNOSIS — F410 Panic disorder [episodic paroxysmal anxiety] without agoraphobia: Secondary | ICD-10-CM | POA: Diagnosis not present

## 2022-05-30 DIAGNOSIS — I10 Essential (primary) hypertension: Secondary | ICD-10-CM

## 2022-05-30 MED ORDER — MIRABEGRON ER 25 MG PO TB24: 25.0000 mg | ORAL_TABLET | Freq: Every day | ORAL | 2 refills | Status: DC

## 2022-05-30 MED ORDER — ACYCLOVIR 400 MG PO TABS
400.0000 mg | ORAL_TABLET | Freq: Three times a day (TID) | ORAL | 0 refills | Status: DC
Start: 2022-05-30 — End: 2022-10-23

## 2022-05-30 NOTE — Assessment & Plan Note (Signed)
Copious white secretions, h/o discomfort, sent fo wet pep and STI testing

## 2022-05-30 NOTE — Assessment & Plan Note (Signed)
Controlled, no change in medication  

## 2022-05-30 NOTE — Progress Notes (Signed)
   Catherine Allen     MRN: [id number]      DOB: 11-05-1975  Chief Complaint  Patient presents with   Follow-up    Vaginal irritation noticed it Friday night felt dry, swollen inflammed     HPI Catherine Allen is here complaining of swelling of the labia x 1 day last week, she also notes slight increase in vaginal discharge.  She denies significant activity for denies any fever chills or swelling of glands" associated with this  She does have type II herpes and states that this may be causing hr current symptoms C/o worsening urinary incontinence, reports accidents  She sneezes coughs or laughs.  She has just started doing Kegel exercises. ROS Denies recent fever or chills. Denies sinus pressure, nasal congestion, ear pain or sore throat. Denies chest congestion, productive cough or wheezing. Denies chest pains, palpitations and leg swelling Denies abdominal pain, nausea, vomiting,diarrhea or constipation.   Denies joint pain, swelling and limitation in mobility. Denies headaches, seizures, numbness, or tingling. Denies depression, uncontrolled anxiety or insomnia. Denies skin break down or rash.   PE  BP 126/80 (BP Location: Right Arm, Patient Position: Sitting, Cuff Size: Large)   Pulse 92   Ht 5\' 4"  (1.626 m)   Wt 172 lb 1.9 oz (78.1 kg)   SpO2 98%   BMI 29.54 kg/m   Patient alert and oriented and in no cardiopulmonary distress.  HEENT: No facial asymmetry, EOMI,     Neck supple .  Chest: Clear to auscultation bilaterally.  CVS: S1, S2 no murmurs, no S3.Regular rate.  ABD: Soft non tender.  Pelvic, labia major and minora normal no visble swelling, mass or ulcer Vaginal exam adequate white  vaginal dc , no odor, no ulcerating lesion seen Ext: No edema  MS: Adequate ROM spine, shoulders, hips and knees.  Skin: Intact, no ulcerations or rash noted.  Psych: Good eye contact, normal affect. Memory intact not anxious or depressed appearing.  CNS: CN 2-12 intact,  power,  normal throughout.no focal deficits noted.   Assessment & Plan  Vulvovaginitis Copious white secretions, h/o discomfort, sent fo wet pep and STI testing  Type 2 HSV infection of vulvovaginal region Presumed current flare , acyclovir x 5 dys precribed , then need to resume prior dosing  Panic attacks Controlled, no change in medication   Urinary incontinence Progressively worsening.  Patient wears incontinence I will see continually 6 months.  Trial of oral medication for overactive bladder as well as she is to start Kegel exercises at.  She will also avoid schedule great Friday on an as-needed basis.  Follow-up in 6 to 8 weeks  Essential hypertension Controlled, no change in medication DASH diet and commitment to daily physical activity for a minimum of 30 minutes discussed and encouraged, as a part of hypertension management. The importance of attaining a healthy weight is also discussed.     05/30/2022    3:15 PM 05/19/2022    2:34 PM 05/19/2022    2:33 PM 05/05/2022    1:40 PM 04/11/2022    3:50 PM 03/17/2022    1:06 PM 02/15/2022    3:59 PM  BP/Weight  Systolic BP 126 130 143 127 143 148 133  Diastolic BP 80 83 81 82 80 85 87  Wt. (Lbs) 172.12  173.04 173   169  BMI 29.54 kg/m2  30.65 kg/m2 30.65 kg/m2   29.94 kg/m2

## 2022-05-30 NOTE — Assessment & Plan Note (Signed)
Controlled, no change in medication DASH diet and commitment to daily physical activity for a minimum of 30 minutes discussed and encouraged, as a part of hypertension management. The importance of attaining a healthy weight is also discussed.     05/30/2022    3:15 PM 05/19/2022    2:34 PM 05/19/2022    2:33 PM 05/05/2022    1:40 PM 04/11/2022    3:50 PM 03/17/2022    1:06 PM 02/15/2022    3:59 PM  BP/Weight  Systolic BP 126 130 143 127 143 148 133  Diastolic BP 80 83 81 82 80 85 87  Wt. (Lbs) 172.12  173.04 173   169  BMI 29.54 kg/m2  30.65 kg/m2 30.65 kg/m2   29.94 kg/m2

## 2022-05-30 NOTE — Assessment & Plan Note (Signed)
Progressively worsening.  Patient wears incontinence I will see continually 6 months.  Trial of oral medication for overactive bladder as well as she is to start Kegel exercises at.  She will also avoid schedule great Friday on an as-needed basis.  Follow-up in 6 to 8 weeks

## 2022-05-30 NOTE — Patient Instructions (Addendum)
F/U in 8 to 10 weeks , re evaluate chronic problems, call if you need me  sooner Specimen  collected is sent for testing for STI infection, 5-day course of antiviral medicine at a higher dose is prescribed.for presumed herpes flare  New medication is sent for urinary incontinence with sneezing and coughing.  Please also start Kegel exercises as we discussed.  Thanks for choosing [facility], we consider it a privelige to serve you.

## 2022-05-30 NOTE — Assessment & Plan Note (Signed)
Presumed current flare , acyclovir x 5 dys precribed , then need to resume prior dosing

## 2022-05-31 ENCOUNTER — Encounter: Payer: Self-pay | Admitting: Family Medicine

## 2022-05-31 DIAGNOSIS — M5010 Cervical disc disorder with radiculopathy, unspecified cervical region: Secondary | ICD-10-CM | POA: Diagnosis not present

## 2022-06-01 ENCOUNTER — Other Ambulatory Visit: Payer: Self-pay | Admitting: Family Medicine

## 2022-06-01 LAB — CERVICOVAGINAL ANCILLARY ONLY
Bacterial Vaginitis (gardnerella): NEGATIVE
Candida Glabrata: NEGATIVE
Candida Vaginitis: NEGATIVE
Chlamydia: NEGATIVE
Comment: NEGATIVE
Comment: NEGATIVE
Comment: NEGATIVE
Comment: NEGATIVE
Comment: NEGATIVE
Comment: NORMAL
Neisseria Gonorrhea: NEGATIVE
Trichomonas: NEGATIVE

## 2022-06-01 MED ORDER — OXYBUTYNIN CHLORIDE 5 MG PO TABS: 5.0000 mg | ORAL_TABLET | Freq: Two times a day (BID) | ORAL | 3 refills | Status: DC

## 2022-06-14 IMAGING — MR MR CERVICAL SPINE W/O CM
5 series · 40 of 48 positions shown · non-contrast
Comparison: None.

CLINICAL DATA: Cervical radiculopathy, no red flag; neck pain with
bilateral arm numbness

EXAM:
MRI CERVICAL SPINE WITHOUT CONTRAST
TECHNIQUE: Multiplanar, multisequence MR imaging of the cervical spine was
performed. No intravenous contrast was administered.

[Series 5: T2 · sagittal · 3.0mm · 0.69mm/px · 6 of 15 slices shown (1 of 2)]
[im 1/15]
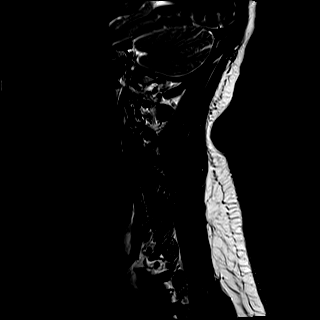
[im 3/15]
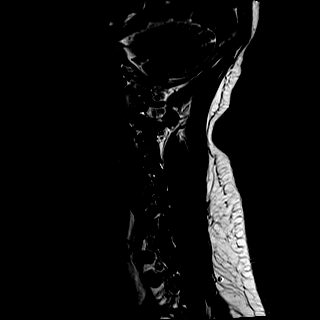
[im 6/15]
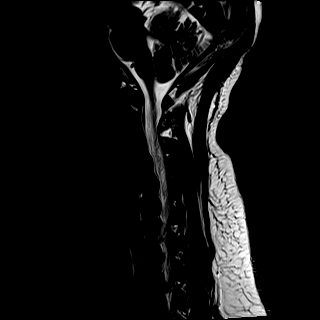
[im 9/15]
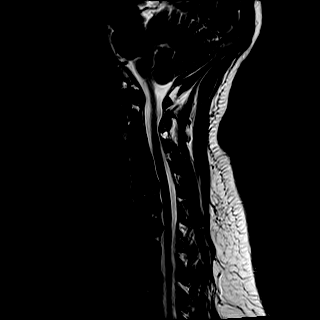
[im 12/15]
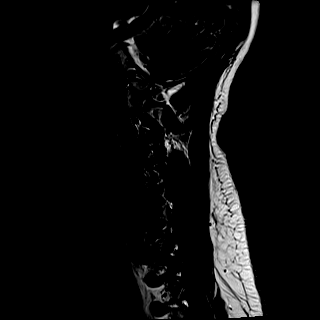
[im 15/15]
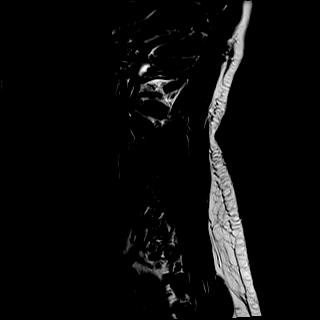

[Series 6: T1 · sagittal · 3.0mm · 0.86mm/px · 6 of 15 slices shown]
[im 1/15]
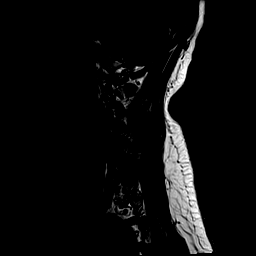
[im 3/15]
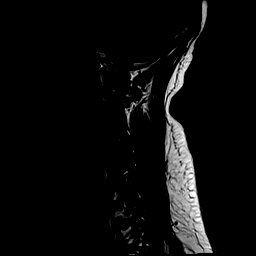
[im 6/15]
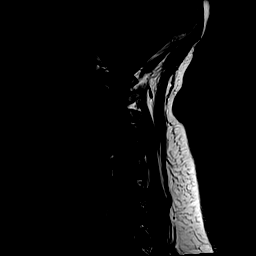
[im 9/15]
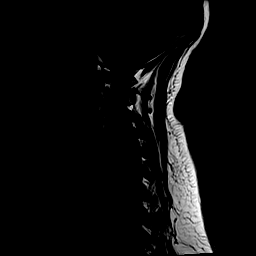
[im 12/15]
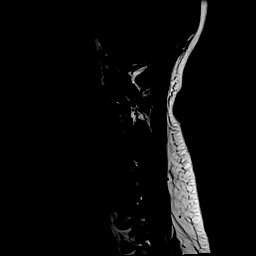
[im 15/15]
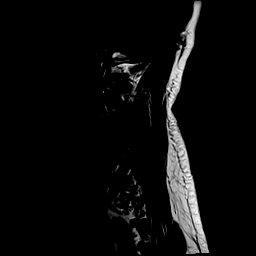

[Series 7: STIR · sagittal · 3.0mm · 0.69mm/px · 6 of 15 slices shown]
[im 1/15]
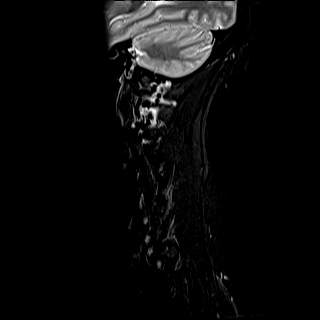
[im 3/15]
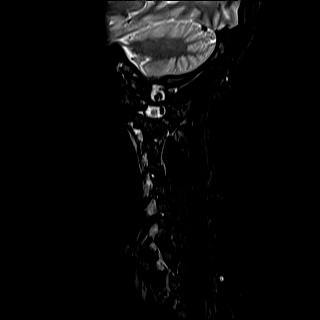
[im 6/15]
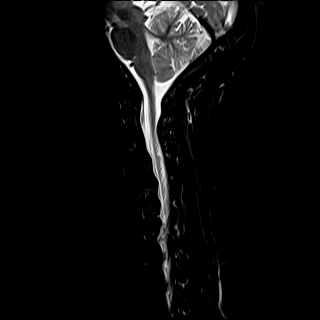
[im 9/15]
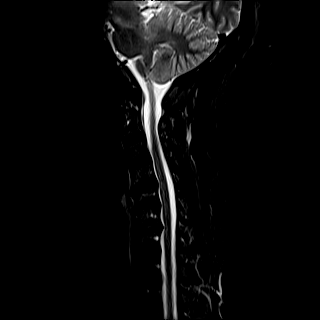
[im 12/15]
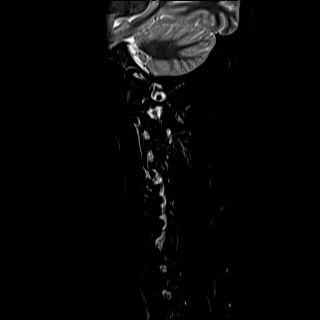
[im 15/15]
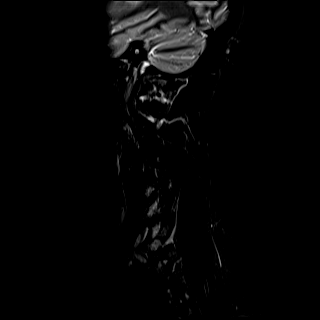

[Series 8: T2 · axial · 3.0mm · 0.70mm/px · z∈[-139,-9]mm · 14 of 40 slices shown (2 of 2)]
[im 1/40]
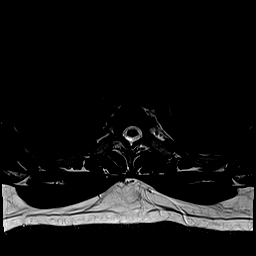
[im 3/40]
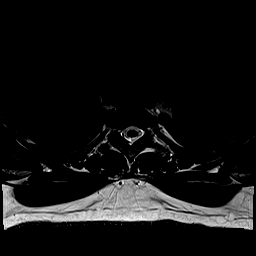
[im 6/40]
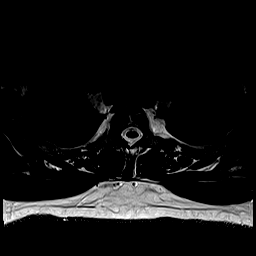
[im 9/40]
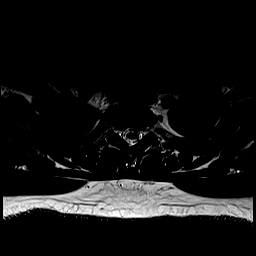
[im 12/40]
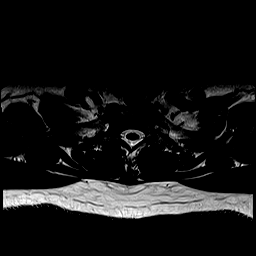
[im 14/40]
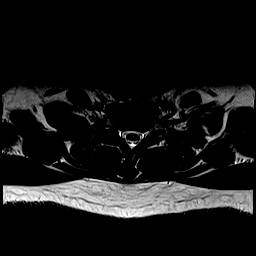
[im 17/40]
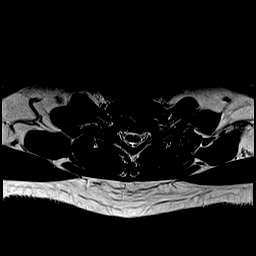
[im 20/40]
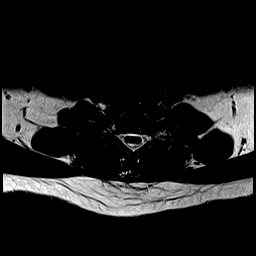
[im 23/40]
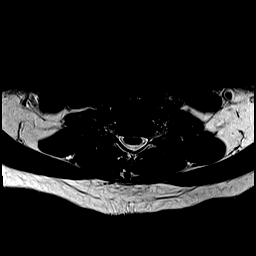
[im 26/40]
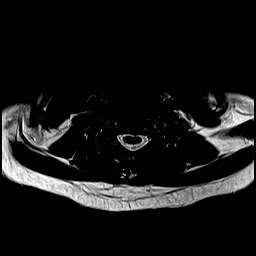
[im 28/40]
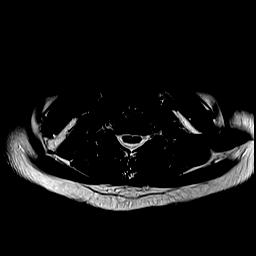
[im 31/40]
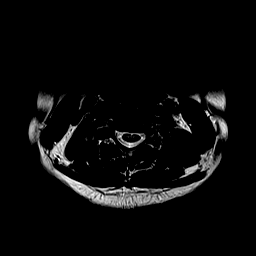
[im 34/40]
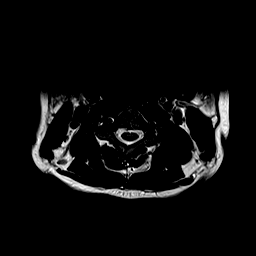
[im 40/40]
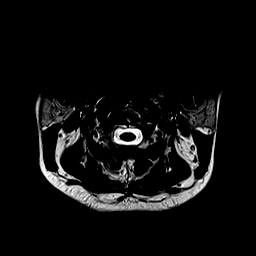

[Series 9: GRE · axial · 3.0mm · 0.35mm/px · z∈[-139,-9]mm · 8 of 40 slices shown]
[im 1/40]
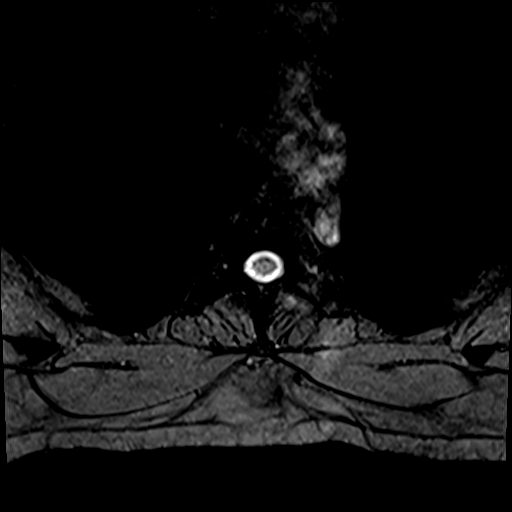
[im 6/40]
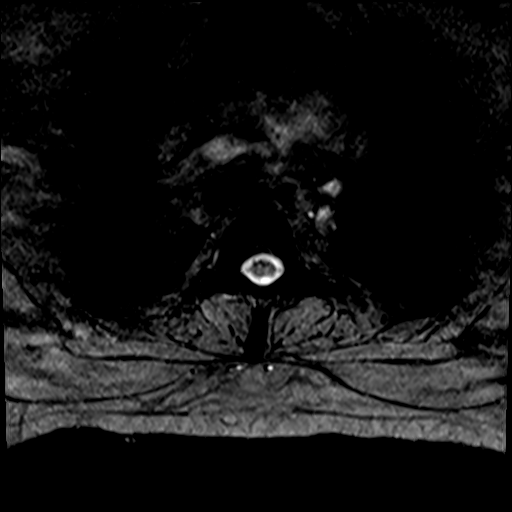
[im 12/40]
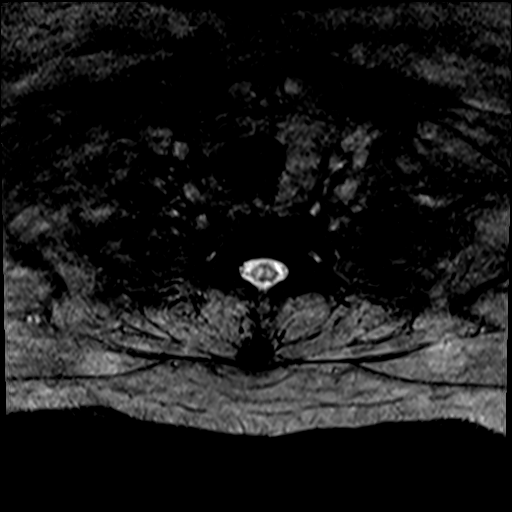
[im 17/40]
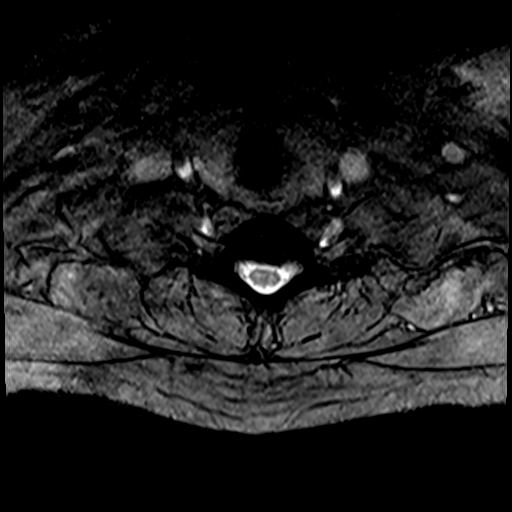
[im 23/40]
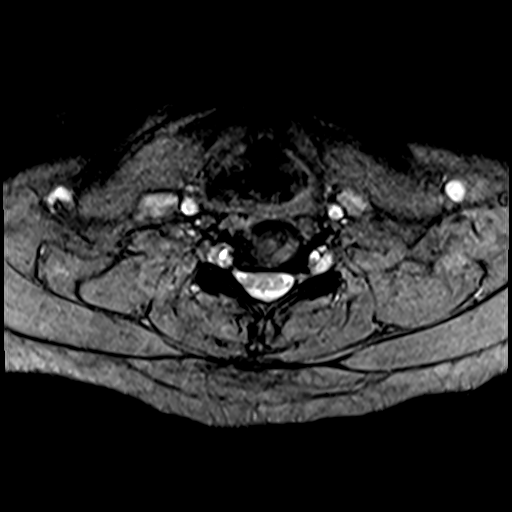
[im 28/40]
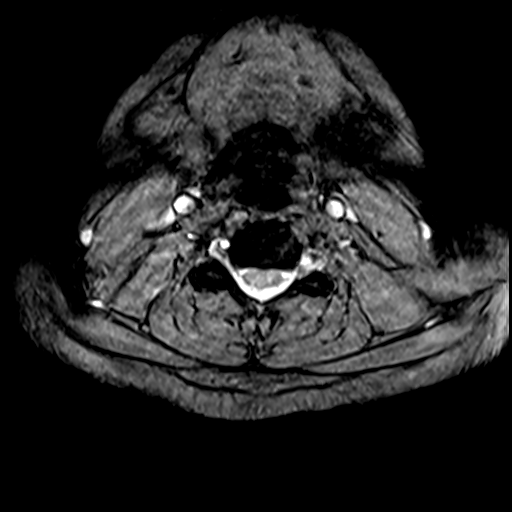
[im 34/40]
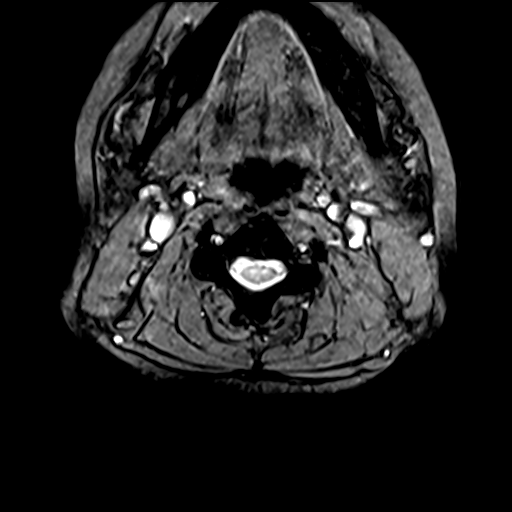
[im 40/40]
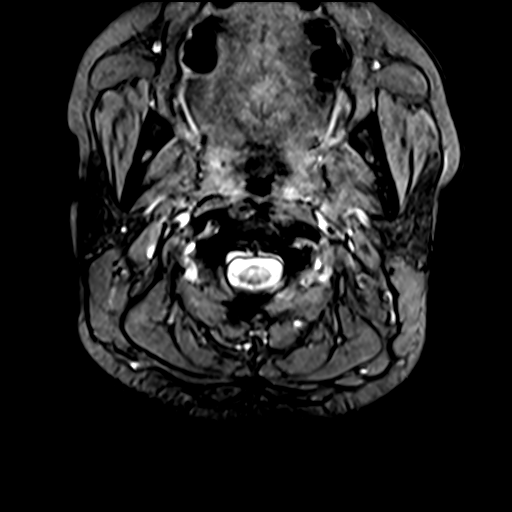

[40 of 48 positions shown; findings below may reference images not displayed]

FINDINGS: Alignment: No significant anteroposterior listhesis.

Vertebrae: Vertebral body heights are maintained. No marrow edema.
No suspicious osseous lesion.

Cord: Normal caliber and signal.

Posterior Fossa, vertebral arteries, paraspinal tissues:
Unremarkable.

Disc levels:

C2-C3:  No canal or foraminal stenosis.

C3-C4:  No canal or foraminal stenosis.

C4-C5: Disc bulge. Right uncovertebral hypertrophy. No canal or left
foraminal stenosis. Mild right foraminal stenosis.

C5-C6: Disc bulge with endplate osteophytes and uncovertebral
hypertrophy. No canal stenosis. Minor right and mild left foraminal
stenosis.

C6-C7: Disc bulge with endplate osteophytes and uncovertebral
hypertrophy. No canal stenosis. Minor right and mild left foraminal
stenosis.

C7-T1: Disc bulge eccentric to the right with endplate osteophyte.
Mild right facet hypertrophy. No canal or left foraminal stenosis.
Minor right foraminal stenosis.
IMPRESSION: Mild multilevel degenerative changes as detailed above. No
significant canal narrowing. Foraminal narrowing is present without
high-grade stenosis.

## 2022-06-28 ENCOUNTER — Other Ambulatory Visit: Payer: Self-pay | Admitting: Family Medicine

## 2022-07-15 ENCOUNTER — Other Ambulatory Visit: Payer: Self-pay | Admitting: Family Medicine

## 2022-07-31 ENCOUNTER — Other Ambulatory Visit: Payer: Self-pay | Admitting: Family Medicine

## 2022-08-01 ENCOUNTER — Ambulatory Visit (INDEPENDENT_AMBULATORY_CARE_PROVIDER_SITE_OTHER): Payer: Medicaid Other | Admitting: Family Medicine

## 2022-08-01 ENCOUNTER — Encounter: Payer: Self-pay | Admitting: Family Medicine

## 2022-08-01 VITALS — BP 132/84 | HR 96 | Ht 64.0 in | Wt 175.0 lb

## 2022-08-01 DIAGNOSIS — Z1231 Encounter for screening mammogram for malignant neoplasm of breast: Secondary | ICD-10-CM

## 2022-08-01 DIAGNOSIS — I10 Essential (primary) hypertension: Secondary | ICD-10-CM

## 2022-08-01 DIAGNOSIS — K219 Gastro-esophageal reflux disease without esophagitis: Secondary | ICD-10-CM

## 2022-08-01 DIAGNOSIS — E669 Obesity, unspecified: Secondary | ICD-10-CM

## 2022-08-01 DIAGNOSIS — E782 Mixed hyperlipidemia: Secondary | ICD-10-CM | POA: Diagnosis not present

## 2022-08-01 DIAGNOSIS — F411 Generalized anxiety disorder: Secondary | ICD-10-CM

## 2022-08-01 DIAGNOSIS — A6004 Herpesviral vulvovaginitis: Secondary | ICD-10-CM

## 2022-08-01 DIAGNOSIS — R7301 Impaired fasting glucose: Secondary | ICD-10-CM | POA: Diagnosis not present

## 2022-08-01 NOTE — Assessment & Plan Note (Signed)
Patient educated about the importance of limiting  Carbohydrate intake , the need to commit to daily physical activity for a minimum of 30 minutes , and to commit weight loss. The fact that changes in all these areas will reduce or eliminate all together the development of diabetes is stressed.      Latest Ref Rng & Units 05/03/2022    8:11 AM 10/07/2021    8:04 AM 03/11/2021    8:32 AM 08/24/2020    9:19 AM 08/20/2020    8:51 AM  Diabetic Labs  HbA1c 4.8 - 5.6 % 4.8    5.5    Chol 100 - 199 mg/dL 409  811  914   782   HDL >39 mg/dL 43  42  43   52   Calc LDL 0 - 99 mg/dL 81  89  89   956   Triglycerides 0 - 149 mg/dL 70  85  97   65   Creatinine 0.57 - 1.00 mg/dL 2.13  0.86  5.78   4.69       08/01/2022    3:13 PM 08/01/2022    2:44 PM 05/30/2022    3:15 PM 05/19/2022    2:34 PM 05/19/2022    2:33 PM 05/05/2022    1:40 PM 04/11/2022    3:50 PM  BP/Weight  Systolic BP 132 121 126 130 143 127 143  Diastolic BP 84 78 80 83 81 82 80  Wt. (Lbs)  175.04 172.12  173.04 173   BMI  30.05 kg/m2 29.54 kg/m2  30.65 kg/m2 30.65 kg/m2        No data to display          Updated lab needed at/ before next visit.

## 2022-08-01 NOTE — Assessment & Plan Note (Signed)
Controlled, no change in medication  

## 2022-08-01 NOTE — Assessment & Plan Note (Signed)
No current flair

## 2022-08-01 NOTE — Assessment & Plan Note (Signed)
  Patient re-educated about  the importance of commitment to a  minimum of 150 minutes of exercise per week as able.  The importance of healthy food choices with portion control discussed, as well as eating regularly and within a 12 hour window most days. The need to choose "clean , green" food 50 to 75% of the time is discussed, as well as to make water the primary drink and set a goal of 64 ounces water daily.       08/01/2022    2:44 PM 05/30/2022    3:15 PM 05/19/2022    2:33 PM  Weight /BMI  Weight 175 lb 0.6 oz 172 lb 1.9 oz 173 lb 0.6 oz  Height 5\' 4"  (1.626 m) 5\' 4"  (1.626 m) 5\' 3"  (1.6 m)  BMI 30.05 kg/m2 29.54 kg/m2 30.65 kg/m2    unchanged

## 2022-08-01 NOTE — Assessment & Plan Note (Signed)
.  cxon1 DASH diet and commitment to daily physical activity for a minimum of 30 minutes discussed and encouraged, as a part of hypertension management. The importance of attaining a healthy weight is also discussed.     08/01/2022    3:13 PM 08/01/2022    2:44 PM 05/30/2022    3:15 PM 05/19/2022    2:34 PM 05/19/2022    2:33 PM 05/05/2022    1:40 PM 04/11/2022    3:50 PM  BP/Weight  Systolic BP 132 121 126 130 143 127 143  Diastolic BP 84 78 80 83 81 82 80  Wt. (Lbs)  175.04 172.12  173.04 173   BMI  30.05 kg/m2 29.54 kg/m2  30.65 kg/m2 30.65 kg/m2

## 2022-08-01 NOTE — Progress Notes (Signed)
Catherine Allen     MRN: [id number]      DOB: September 19, 1975  Chief Complaint  Patient presents with   Follow-up    Follow up    HPI [name] is here for follow up and re-evaluation of chronic medical conditions, medication management and review of any available recent lab and radiology data.  Preventive health is updated, specifically  Cancer screening and Immunization.   Questions or concerns regarding consultations or procedures which the PT has had in the interim are  addressed. The PT denies any adverse reactions to current medications since the last visit.  There are no new concerns.  There are no specific complaints   ROS Denies recent fever or chills. Denies sinus pressure, nasal congestion, ear pain or sore throat. Denies chest congestion, productive cough or wheezing. Denies chest pains, palpitations and leg swelling Denies abdominal pain, nausea, vomiting,diarrhea or constipation.   Denies dysuria, frequency, hesitancy or incontinence. Denies joint pain, swelling and limitation in mobility. Denies headaches, seizures, numbness, or tingling. Denies depression, anxiety or insomnia. Denies skin break down or rash.   PE  BP 132/84   Pulse 96   Ht 5\' 4"  (1.626 m)   Wt 175 lb 0.6 oz (79.4 kg)   SpO2 98%   BMI 30.05 kg/m   Patient alert and oriented and in no cardiopulmonary distress.  HEENT: No facial asymmetry, EOMI,     Neck supple .  Chest: Clear to auscultation bilaterally.  CVS: S1, S2 no murmurs, no S3.Regular rate.  ABD: Soft non tender.   Ext: No edema  MS: Adequate ROM spine, shoulders, hips and knees.  Skin: Intact, no ulcerations or rash noted.  Psych: Good eye contact, normal affect. Memory intact not anxious or depressed appearing.  CNS: CN 2-12 intact, power,  normal throughout.no focal deficits noted.   Assessment & Plan  Essential hypertension .cxon1 DASH diet and commitment to daily physical activity for a minimum of 30 minutes  discussed and encouraged, as a part of hypertension management. The importance of attaining a healthy weight is also discussed.     08/01/2022    3:13 PM 08/01/2022    2:44 PM 05/30/2022    3:15 PM 05/19/2022    2:34 PM 05/19/2022    2:33 PM 05/05/2022    1:40 PM 04/11/2022    3:50 PM  BP/Weight  Systolic BP 132 121 126 130 143 127 143  Diastolic BP 84 78 80 83 81 82 80  Wt. (Lbs)  175.04 172.12  173.04 173   BMI  30.05 kg/m2 29.54 kg/m2  30.65 kg/m2 30.65 kg/m2        IFG (impaired fasting glucose) Patient educated about the importance of limiting  Carbohydrate intake , the need to commit to daily physical activity for a minimum of 30 minutes , and to commit weight loss. The fact that changes in all these areas will reduce or eliminate all together the development of diabetes is stressed.      Latest Ref Rng & Units 05/03/2022    8:11 AM 10/07/2021    8:04 AM 03/11/2021    8:32 AM 08/24/2020    9:19 AM 08/20/2020    8:51 AM  Diabetic Labs  HbA1c 4.8 - 5.6 % 4.8    5.5    Chol 100 - 199 mg/dL 119  147  829   562   HDL >39 mg/dL 43  42  43   52   Calc LDL 0 -  99 mg/dL 81  89  89   811   Triglycerides 0 - 149 mg/dL 70  85  97   65   Creatinine 0.57 - 1.00 mg/dL 9.14  7.82  9.56   2.13       08/01/2022    3:13 PM 08/01/2022    2:44 PM 05/30/2022    3:15 PM 05/19/2022    2:34 PM 05/19/2022    2:33 PM 05/05/2022    1:40 PM 04/11/2022    3:50 PM  BP/Weight  Systolic BP 132 121 126 130 143 127 143  Diastolic BP 84 78 80 83 81 82 80  Wt. (Lbs)  175.04 172.12  173.04 173   BMI  30.05 kg/m2 29.54 kg/m2  30.65 kg/m2 30.65 kg/m2        No data to display          Updated lab needed at/ before next visit.   Obesity (BMI 30.0-34.9)  Patient re-educated about  the importance of commitment to a  minimum of 150 minutes of exercise per week as able.  The importance of healthy food choices with portion control discussed, as well as eating regularly and within a 12 hour window most  days. The need to choose "clean , green" food 50 to 75% of the time is discussed, as well as to make water the primary drink and set a goal of 64 ounces water daily.       08/01/2022    2:44 PM 05/30/2022    3:15 PM 05/19/2022    2:33 PM  Weight /BMI  Weight 175 lb 0.6 oz 172 lb 1.9 oz 173 lb 0.6 oz  Height 5\' 4"  (1.626 m) 5\' 4"  (1.626 m) 5\' 3"  (1.6 m)  BMI 30.05 kg/m2 29.54 kg/m2 30.65 kg/m2    unchanged  GERD (gastroesophageal reflux disease) Controlled, no change in medication   GAD (generalized anxiety disorder) Controlled, no change in medication   Type 2 HSV infection of vulvovaginal region No current flair

## 2022-08-01 NOTE — Patient Instructions (Signed)
Keep f/u already scheduled , pt for pap at visit, pls add to appt note  Please schedule mammogram at checkout  Fasting lipid, cmp and EGFr 3 to 5 days before October appt  No med changes  It is important that you exercise regularly at least 30 minutes 5 times a week. If you develop chest pain, have severe difficulty breathing, or feel very tired, stop exercising immediately and seek medical attention    Think about what you will eat, plan ahead. Choose " clean, green, fresh or frozen" over canned, processed or packaged foods which are more sugary, salty and fatty. 70 to 75% of food eaten should be vegetables and fruit. Three meals at set times with snacks allowed between meals, but they must be fruit or vegetables. Aim to eat over a 12 hour period , example 7 am to 7 pm, and STOP after  your last meal of the day. Drink water,generally about 64 ounces per day, no other drink is as healthy. Fruit juice is best enjoyed in a healthy way, by EATING the fruit.  Thanks for choosing [facility], we consider it a privelige to serve you.

## 2022-08-13 ENCOUNTER — Other Ambulatory Visit: Payer: Self-pay | Admitting: Family Medicine

## 2022-08-24 ENCOUNTER — Encounter: Payer: Self-pay | Admitting: Gastroenterology

## 2022-08-28 ENCOUNTER — Other Ambulatory Visit: Payer: Self-pay | Admitting: Family Medicine

## 2022-09-13 ENCOUNTER — Other Ambulatory Visit: Payer: Self-pay | Admitting: Family Medicine

## 2022-09-14 ENCOUNTER — Telehealth: Payer: Self-pay

## 2022-09-14 NOTE — Telephone Encounter (Signed)
Pt phoned and asked not to be called before 1pm.

## 2022-10-03 NOTE — Progress Notes (Unsigned)
Referring Provider: Kerri Perches, MD Primary Care Physician:  Kerri Perches, MD Primary GI Physician: Dr. Jena Gauss  Chief Complaint  Patient presents with   Follow-up    Follow up. No problem     HPI:   Catherine Allen is a 47 y.o. female with GI history of GERD, dysphagia, constipation, presenting today for routine 1 year follow-up.  Last seen in the office 09/29/2021.  GERD fairly well-controlled on pantoprazole 40 mg daily.  Using famotidine or Zantac as needed for occasional breakthrough or prior to trigger foods.  Noted chronic constipation was not adequately managed with Benefiber daily.  She was having small amounts of stool daily and occasionally incomplete emptying.  Noted some hemorrhoid issues but no bleeding.  She was advised to continue Benefiber, add MiraLAX, and continue over-the-counter hemorrhoid cream as needed.  Recommended 1 year follow-up.  Today: GERD:  Well-controlled on pantoprazole 40 mg daily.  Occasional breakthrough symptoms for which she uses Pepcid or Tums, but this is not routine.  No nausea, vomiting, dysphagia.  Constipation: Bowels moving daily to every 3 days.  Will take MiraLAX every 3 days or so if she has not had a bowel movement.  If taking MiraLAX daily, she will have loose stools that irritate her hemorrhoids.  Does not remember taking Benefiber.  States she was taking a probiotic powder and this started "griping" her stomach, so she stopped it.   Prior GI studies:  Last colonoscopy 11/27/2019 for colon cancer screening revealing grade 1 internal hemorrhoids, otherwise normal exam. Next colonoscopy in 2031.  BPE in [name] 2021 entirely normal.   Last EGD was in 2012 with small hiatal hernia and mild erosive reflux esophagitis.    Past Medical History:  Diagnosis Date   Allergy    Anxiety    [employer] 07/26/2019   Dyspareunia    Hemorrhoids    Herpes    HLD (hyperlipidemia)    HTN (hypertension)    Hypertension     [employer] 07/26/2019   MRSA (methicillin resistant Staphylococcus aureus) 2010   boils thighs   S/P endoscopy 06/2010   Dr. Jena Gauss: mild erosive esophagitis, chronic duodenitis, mild gastritis, stop NSAIDs.    Vaginitis     Past Surgical History:  Procedure Laterality Date   CESAREAN SECTION  2004   CESAREAN SECTION N/A    [employer] 07/26/2019   COLONOSCOPY WITH PROPOFOL N/A 11/27/2019   Procedure: COLONOSCOPY WITH PROPOFOL;  Surgeon: Corbin Ade, MD;  Location: [facility];  Service: Endoscopy;  Laterality: N/A;  9:00am   ESOPHAGOGASTRODUODENOSCOPY  2012   small hiatal hernia and mild erosive reflux esophagitis    Current Outpatient Medications  Medication Sig Dispense Refill   acyclovir (ZOVIRAX) 400 MG tablet Take 1 tablet (400 mg total) by mouth 3 (three) times daily. 15 tablet 0   amLODipine (NORVASC) 10 MG tablet TAKE 1 TABLET BY MOUTH EVERY DAY 90 tablet 1   busPIRone (BUSPAR) 7.5 MG tablet TAKE 1 TABLET BY MOUTH THREE TIMES A DAY 270 tablet 2   chlorpheniramine (CHLOR-TRIMETON) 4 MG tablet Take one tablet once daily for excess drainage for 3 days, then one tablet once daily, as needed 14 tablet 0   gabapentin (NEURONTIN) 100 MG capsule Take two capsules at bedtime for pain 60 capsule 5   levocetirizine (XYZAL) 5 MG tablet TAKE 1 TABLET BY MOUTH EVERY DAY IN THE EVENING 30 tablet 5   Levonorgest-Eth Est & Eth Est 42-21-21-7 DAYS TABS TAKE 1  TABLET BY MOUTH EVERY DAY 91 tablet 1   LORazepam (ATIVAN) 1 MG tablet TAKE ONE TABLET BY MOUTH ONCE DAILY , AS NEEDED, FOR EXTREME ANXIETY OR PANIC 12 tablet 5   montelukast (SINGULAIR) 10 MG tablet TAKE 1 TABLET BY MOUTH EVERYDAY AT BEDTIME 90 tablet 1   pantoprazole (PROTONIX) 40 MG tablet Take 1 tablet (40 mg total) by mouth daily before breakfast. 90 tablet 3   potassium chloride (KLOR-CON) 10 MEQ tablet TAKE 1 TABLET BY MOUTH TWICE A DAY 180 tablet 1   rosuvastatin (CRESTOR) 20 MG tablet TAKE 1 TABLET BY MOUTH EVERY DAY STOP  PRAVASTATIN 90 tablet 3   triamterene-hydrochlorothiazide (MAXZIDE) 75-50 MG tablet TAKE 1 TABLET BY MOUTH EVERY DAY 90 tablet 2   fluticasone (FLONASE) 50 MCG/ACT nasal spray Place 2 sprays into both nostrils daily. (Patient not taking: Reported on 10/04/2022) 16 g 6   No current facility-administered medications for this visit.    Allergies as of 10/04/2022   (No Known Allergies)    Family History  Problem Relation Age of Onset   Hypertension Mother    Asthma Sister    Breast cancer Cousin    Colon cancer Neg Hx     Social History   Socioeconomic History   Marital status: Single    Spouse name: Not on file   Number of children: 2   Years of education: Not on file   Highest education level: Bachelor's degree (e.g., BA, AB, BS)  Occupational History   Occupation: homemaker  Tobacco Use   Smoking status: Never   Smokeless tobacco: Never  Substance and Sexual Activity   Alcohol use: No   Drug use: No   Sexual activity: Yes    Partners: Male    Birth control/protection: Pill  Other Topics Concern   Not on file  Social History Narrative   LIves w/ 2 sons 8 &16   Social Determinants of Health   Financial Resource Strain: Low Risk  (05/19/2022)   Overall Financial Resource Strain (CARDIA)    Difficulty of Paying Living Expenses: Not hard at all  Food Insecurity: No Food Insecurity (05/19/2022)   Hunger Vital Sign    Worried About Running Out of Food in the Last Year: Never true    Ran Out of Food in the Last Year: Never true  Transportation Needs: No Transportation Needs (05/19/2022)   PRAPARE - Administrator, Civil Service (Medical): No    Lack of Transportation (Non-Medical): No  Physical Activity: Unknown (05/19/2022)   Exercise Vital Sign    Days of Exercise per Week: 0 days    Minutes of Exercise per Session: Not on file  Stress: No Stress Concern Present (05/19/2022)   [employer] of Occupational Health - Occupational Stress Questionnaire     Feeling of Stress : Only a little  Social Connections: Socially Isolated (05/19/2022)   Social Connection and Isolation Panel [NHANES]    Frequency of Communication with Friends and Family: More than three times a week    Frequency of Social Gatherings with Friends and Family: Twice a week    Attends Religious Services: Never    Database administrator or Organizations: No    Attends Engineer, structural: Not on file    Marital Status: Never married    Review of Systems: Gen: Denies fever, chills, cold or flulike symptoms, presyncope, syncope. CV: Denies chest pain, palpitations. Resp: Denies dyspnea, cough. GI: See HPI Heme: See HPI  Physical Exam: BP 133/82 (BP Location: Right Arm, Patient Position: Sitting, Cuff Size: Normal)   Pulse 92   Temp 97.7 F (36.5 C) (Temporal)   Ht 5\' 3"  (1.6 m)   Wt 174 lb 3.2 oz (79 kg)   SpO2 99%   BMI 30.86 kg/m  General:   Alert and oriented. No distress noted. Pleasant and cooperative.  Head:  Normocephalic and atraumatic. Eyes:  Conjuctiva clear without scleral icterus. Heart:  S1, S2 present without murmurs appreciated. Lungs:  Clear to auscultation bilaterally. No wheezes, rales, or rhonchi. No distress.  Abdomen:  +BS, soft, non-tender and non-distended. No rebound or guarding. No HSM or masses noted. Msk:  Symmetrical without gross deformities. Normal posture. Extremities:  Without edema. Neurologic:  Alert and  oriented x4 Psych:  Normal mood and affect.    Assessment:  47 y.o. female with GI history of GERD, dysphagia, constipation, presenting today for routine 1 year follow-up.   GERD: Fairly well-controlled on pantoprazole 40 mg daily.  Occasional breakthrough that responds well to Pepcid or Tums.  No alarm symptoms.  Constipation: Has room for improvement.  Bowels moving daily to every 3 days, taking MiraLAX as needed as daily use causes loose stools which irritate her hemorrhoids.  No alarm symptoms. Colonoscopy  up to date. Recommended adding Benefiber daily.   Plan:  Continue pantoprazole 40 mg daily. Continue famotidine or Pepcid as needed for breakthrough GERD. Start Benefiber 3 teaspoons daily x 2 weeks, then increase to twice daily. Continue MiraLAX every 2 to 3 days.  If needed, can try taking one half capful of MiraLAX daily. Follow-up in 1 year or sooner if needed.   Ermalinda Memos, PA-C [facility] 10/04/2022

## 2022-10-04 ENCOUNTER — Encounter: Payer: Self-pay | Admitting: Gastroenterology

## 2022-10-04 ENCOUNTER — Ambulatory Visit (INDEPENDENT_AMBULATORY_CARE_PROVIDER_SITE_OTHER): Payer: Medicaid Other | Admitting: Gastroenterology

## 2022-10-04 VITALS — BP 133/82 | HR 92 | Temp 97.7°F | Ht 63.0 in | Wt 174.2 lb

## 2022-10-04 DIAGNOSIS — K5900 Constipation, unspecified: Secondary | ICD-10-CM | POA: Diagnosis not present

## 2022-10-04 DIAGNOSIS — K219 Gastro-esophageal reflux disease without esophagitis: Secondary | ICD-10-CM | POA: Diagnosis not present

## 2022-10-04 NOTE — Patient Instructions (Signed)
Continue pantoprazole 40 mg daily for acid reflux.  May continue to use Pepcid or Tums as needed for occasional breakthrough symptoms.  For constipation: Start Benefiber 3 teaspoons daily x 2 weeks, then increase to twice daily. You may continue to take MiraLAX every 2 to 3 days.  If needed, you can try taking one half capful of MiraLAX daily as the full dose every day seemed to cause loose stools and irritate your hemorrhoids.  We will plan to follow-up with you in 1 year or sooner if needed.  It was great to see you again today!  Ermalinda Memos, PA-C [facility]

## 2022-10-21 ENCOUNTER — Other Ambulatory Visit: Payer: Self-pay | Admitting: Family Medicine

## 2022-10-21 DIAGNOSIS — N393 Stress incontinence (female) (male): Secondary | ICD-10-CM

## 2022-10-21 DIAGNOSIS — A6004 Herpesviral vulvovaginitis: Secondary | ICD-10-CM

## 2022-10-27 ENCOUNTER — Encounter: Payer: Self-pay | Admitting: Family Medicine

## 2022-10-27 ENCOUNTER — Ambulatory Visit: Payer: Medicaid Other | Admitting: Family Medicine

## 2022-10-27 VITALS — BP 127/79 | HR 85 | Ht 63.0 in | Wt 173.0 lb

## 2022-10-27 DIAGNOSIS — Z23 Encounter for immunization: Secondary | ICD-10-CM

## 2022-10-27 DIAGNOSIS — F411 Generalized anxiety disorder: Secondary | ICD-10-CM

## 2022-10-27 DIAGNOSIS — F410 Panic disorder [episodic paroxysmal anxiety] without agoraphobia: Secondary | ICD-10-CM

## 2022-10-27 DIAGNOSIS — I10 Essential (primary) hypertension: Secondary | ICD-10-CM | POA: Diagnosis not present

## 2022-10-27 MED ORDER — LORAZEPAM 1 MG PO TABS
1.0000 mg | ORAL_TABLET | Freq: Every day | ORAL | 2 refills | Status: DC
Start: 2022-10-27 — End: 2023-01-29

## 2022-10-27 MED ORDER — BUSPIRONE HCL 10 MG PO TABS: 10.0000 mg | ORAL_TABLET | Freq: Three times a day (TID) | ORAL | 5 refills | Status: DC

## 2022-10-27 NOTE — Patient Instructions (Signed)
Please reschedule next appt to 7 to 7 weeks  Flu vaccine today  You are being referred to therapist, Esmond Harps as we discussed, behavioral therapy will help with management of your anxiety  Increased doses of anxiety medications as we discussed are art your pharmacy  Avoid caffeine, commit to daily exercise routine including yoga , and eat regularly with lots of vegetables and fruit  Thanks for choosing [facility], we consider it a privelige to serve you.
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
## 2022-10-29 ENCOUNTER — Other Ambulatory Visit: Payer: Self-pay | Admitting: Family Medicine

## 2022-10-29 DIAGNOSIS — Z23 Encounter for immunization: Secondary | ICD-10-CM | POA: Insufficient documentation

## 2022-10-29 NOTE — Assessment & Plan Note (Signed)
Increased and uncontrolled with new living environment, short term bedtime lorazepam , increase in frequency, not dose and refer for therapy

## 2022-10-29 NOTE — Assessment & Plan Note (Signed)
Score of 8, will increase dose of buspar and she is intersted in and will benefit from therap, referral ebntered

## 2022-10-29 NOTE — Assessment & Plan Note (Signed)
Controlled, no change in medication  

## 2022-10-29 NOTE — Assessment & Plan Note (Signed)
After obtaining informed consent, the vaccine is  administered , with no adverse effect noted at the time of administration.  

## 2022-10-30 DIAGNOSIS — H25093 Other age-related incipient cataract, bilateral: Secondary | ICD-10-CM | POA: Diagnosis not present

## 2022-10-30 DIAGNOSIS — D3132 Benign neoplasm of left choroid: Secondary | ICD-10-CM | POA: Diagnosis not present

## 2022-11-07 ENCOUNTER — Ambulatory Visit: Payer: Medicaid Other | Admitting: Family Medicine

## 2022-11-18 ENCOUNTER — Other Ambulatory Visit: Payer: Self-pay | Admitting: Family Medicine

## 2022-12-11 ENCOUNTER — Other Ambulatory Visit: Payer: Self-pay | Admitting: Gastroenterology

## 2022-12-11 ENCOUNTER — Encounter: Payer: Self-pay | Admitting: Family Medicine

## 2022-12-11 ENCOUNTER — Other Ambulatory Visit: Payer: Self-pay | Admitting: Family Medicine

## 2022-12-11 DIAGNOSIS — K219 Gastro-esophageal reflux disease without esophagitis: Secondary | ICD-10-CM

## 2022-12-12 ENCOUNTER — Other Ambulatory Visit: Payer: Self-pay

## 2022-12-12 DIAGNOSIS — I10 Essential (primary) hypertension: Secondary | ICD-10-CM | POA: Diagnosis not present

## 2022-12-12 DIAGNOSIS — E782 Mixed hyperlipidemia: Secondary | ICD-10-CM | POA: Diagnosis not present

## 2022-12-12 MED ORDER — POTASSIUM CHLORIDE ER 10 MEQ PO TBCR: 10.0000 meq | EXTENDED_RELEASE_TABLET | Freq: Two times a day (BID) | ORAL | 1 refills | Status: DC

## 2022-12-13 LAB — LIPID PANEL
Chol/HDL Ratio: 3.5 {ratio} (ref 0.0–4.4)
Cholesterol, Total: 159 mg/dL (ref 100–199)
HDL: 46 mg/dL (ref >39)
LDL Chol Calc (NIH): 97 mg/dL (ref 0–99)
Triglycerides: 82 mg/dL (ref 0–149)
VLDL Cholesterol Cal: 16 mg/dL (ref 5–40)

## 2022-12-13 LAB — CMP14+EGFR
ALT: 16 [IU]/L (ref 0–32)
AST: 16 [IU]/L (ref 0–40)
Albumin: 3.9 g/dL (ref 3.9–4.9)
Alkaline Phosphatase: 78 [IU]/L (ref 44–121)
BUN/Creatinine Ratio: 11 (ref 9–23)
BUN: 10 mg/dL (ref 6–24)
Bilirubin Total: 0.3 mg/dL (ref 0.0–1.2)
CO2: 21 mmol/L (ref 20–29)
Calcium: 9.1 mg/dL (ref 8.7–10.2)
Chloride: 104 mmol/L (ref 96–106)
Creatinine, Ser: 0.95 mg/dL (ref 0.57–1.00)
Globulin, Total: 2.5 g/dL (ref 1.5–4.5)
Glucose: 97 mg/dL (ref 70–99)
Potassium: 3.7 mmol/L (ref 3.5–5.2)
Sodium: 140 mmol/L (ref 134–144)
Total Protein: 6.4 g/dL (ref 6.0–8.5)
eGFR: 74 mL/min/{1.73_m2} (ref >59)

## 2022-12-15 ENCOUNTER — Ambulatory Visit: Payer: Medicaid Other | Admitting: Family Medicine

## 2022-12-15 ENCOUNTER — Encounter: Payer: Self-pay | Admitting: Family Medicine

## 2022-12-15 ENCOUNTER — Other Ambulatory Visit (HOSPITAL_COMMUNITY)
Admission: RE | Admit: 2022-12-15 | Discharge: 2022-12-15 | Disposition: A | Discharge status: Home / Self Care | Payer: Medicaid Other | Source: Ambulatory Visit | Attending: Family Medicine | Admitting: Family Medicine

## 2022-12-15 VITALS — BP 126/79 | HR 84 | Resp 16 | Ht 63.0 in | Wt 176.0 lb

## 2022-12-15 DIAGNOSIS — Z124 Encounter for screening for malignant neoplasm of cervix: Secondary | ICD-10-CM | POA: Diagnosis not present

## 2022-12-15 DIAGNOSIS — F411 Generalized anxiety disorder: Secondary | ICD-10-CM

## 2022-12-15 DIAGNOSIS — N3946 Mixed incontinence: Secondary | ICD-10-CM

## 2022-12-15 DIAGNOSIS — I10 Essential (primary) hypertension: Secondary | ICD-10-CM

## 2022-12-15 NOTE — Patient Instructions (Signed)
F/U in 4.5 months,call if you need me sooner  Pap is sent  You are referred to Urology  re incontinence and urologic concerns   No med changes  Best for 2025! It is important that you exercise regularly at least 30 minutes 5 times a week. If you develop chest pain, have severe difficulty breathing, or feel very tired, stop exercising immediately and seek medical attention   Think about what you will eat, plan ahead. Choose " clean, green, fresh or frozen" over canned, processed or packaged foods which are more sugary, salty and fatty. 70 to 75% of food eaten should be vegetables and fruit. Three meals at set times with snacks allowed between meals, but they must be fruit or vegetables. Aim to eat over a 12 hour period , example 7 am to 7 pm, and STOP after  your last meal of the day. Drink water,generally about 64 ounces per day, no other drink is as healthy. Fruit juice is best enjoyed in a healthy way, by EATING the fruit. Thanks for choosing [facility], we consider it a privelige to serve you.

## 2022-12-19 LAB — CYTOLOGY - PAP
Comment: NEGATIVE
Diagnosis: NEGATIVE
High risk HPV: NEGATIVE

## 2023-01-01 DIAGNOSIS — N3946 Mixed incontinence: Secondary | ICD-10-CM | POA: Insufficient documentation

## 2023-01-01 DIAGNOSIS — Z124 Encounter for screening for malignant neoplasm of cervix: Secondary | ICD-10-CM | POA: Insufficient documentation

## 2023-01-01 NOTE — Assessment & Plan Note (Signed)
Controlled, no change in medication  

## 2023-01-01 NOTE — Assessment & Plan Note (Addendum)
Pap sent,

## 2023-01-01 NOTE — Assessment & Plan Note (Signed)
Controlled, no change in medication DASH diet and commitment to daily physical activity for a minimum of 30 minutes discussed and encouraged, as a part of hypertension management. The importance of attaining a healthy weight is also discussed.     12/15/2022    3:47 PM 10/27/2022    8:08 AM 10/04/2022    1:07 PM 08/01/2022    3:13 PM 08/01/2022    2:44 PM 05/30/2022    3:15 PM 05/19/2022    2:34 PM  BP/Weight  Systolic BP 126 127 133 132 121 126 130  Diastolic BP 79 79 82 84 78 80 83  Wt. (Lbs) 176 173.04 174.2  175.04 172.12   BMI 31.18 kg/m2 30.65 kg/m2 30.86 kg/m2  30.05 kg/m2 29.54 kg/m2

## 2023-01-01 NOTE — Assessment & Plan Note (Signed)
Reports significant incontinence both urge and stress , will try medication but also has concerns which suggest a posssible fistula, refer urology

## 2023-01-01 NOTE — Progress Notes (Signed)
   Catherine Allen     MRN: [id number]      DOB: 08/27/1975  Chief Complaint  Patient presents with   Hypertension    6 month follow up   Urinary Incontinence    States she was previously given a med for incontinence but never took it. Would like for it to be prescribed again. Did note that during her cycle her tampon smelled like urine- wanted to report that     HPI [name] is here for follow up and re-evaluation of chronic medical conditions, medication management and review of any available recent lab and radiology data.  Preventive health is updated, specifically  Cancer screening and Immunization.   Questions or concerns regarding consultations or procedures which the PT has had in the interim are  addressed. The PT denies any adverse reactions to current medications since the last visit.  See above concerns re urine   ROS Denies recent fever or chills. Denies sinus pressure, nasal congestion, ear pain or sore throat. Denies chest congestion, productive cough or wheezing. Denies chest pains, palpitations and leg swelling Denies abdominal pain, nausea, vomiting,diarrhea or constipation.   . Denies joint pain, swelling and limitation in mobility. Denies headaches, seizures, numbness, or tingling. Denies uncontrolled depression, anxiety or insomnia. Denies skin break down or rash.   PE  BP 126/79   Pulse 84   Resp 16   Ht 5\' 3"  (1.6 m)   Wt 176 lb (79.8 kg)   SpO2 98%   BMI 31.18 kg/m   Patient alert and oriented and in no cardiopulmonary distress.  HEENT: No facial asymmetry, EOMI,     Neck supple .  Chest: Clear to auscultation bilaterally.  CVS: S1, S2 no murmurs, no S3.Regular rate.  Ext: No edema  Pelvic: no lesions or ulcers on vulva or vaginal area Physiologic d/c cervix appears health, no adnexal masses , no adnexal mass, no cervical motion or adnexal tenderness  MS: Adequate ROM spine, shoulders, hips and knees.  Skin: Intact, no ulcerations or  rash noted.  Psych: Good eye contact, normal affect. Memory intact not anxious or depressed appearing.  CNS: CN 2-12 intact, power,  normal throughout.no focal deficits noted.   Assessment & Plan  Essential hypertension Controlled, no change in medication DASH diet and commitment to daily physical activity for a minimum of 30 minutes discussed and encouraged, as a part of hypertension management. The importance of attaining a healthy weight is also discussed.     12/15/2022    3:47 PM 10/27/2022    8:08 AM 10/04/2022    1:07 PM 08/01/2022    3:13 PM 08/01/2022    2:44 PM 05/30/2022    3:15 PM 05/19/2022    2:34 PM  BP/Weight  Systolic BP 126 127 133 132 121 126 130  Diastolic BP 79 79 82 84 78 80 83  Wt. (Lbs) 176 173.04 174.2  175.04 172.12   BMI 31.18 kg/m2 30.65 kg/m2 30.86 kg/m2  30.05 kg/m2 29.54 kg/m2        Mixed stress and urge urinary incontinence Reports significant incontinence both urge and stress , will try medication but also has concerns which suggest a posssible fistula, refer urology  Screening for malignant neoplasm of cervix Pap sent,   GAD (generalized anxiety disorder) Controlled, no change in medication

## 2023-01-27 ENCOUNTER — Other Ambulatory Visit: Payer: Self-pay | Admitting: Family Medicine

## 2023-01-27 ENCOUNTER — Encounter: Payer: Self-pay | Admitting: Family Medicine

## 2023-01-28 ENCOUNTER — Other Ambulatory Visit: Payer: Self-pay | Admitting: Family Medicine

## 2023-01-29 ENCOUNTER — Encounter (HOSPITAL_COMMUNITY): Payer: Self-pay

## 2023-01-29 ENCOUNTER — Ambulatory Visit (HOSPITAL_COMMUNITY)
Admission: RE | Admit: 2023-01-29 | Discharge: 2023-01-29 | Disposition: A | Discharge status: Home / Self Care | Payer: Medicaid Other | Source: Ambulatory Visit | Attending: Family Medicine | Admitting: Family Medicine

## 2023-01-29 DIAGNOSIS — Z1231 Encounter for screening mammogram for malignant neoplasm of breast: Secondary | ICD-10-CM | POA: Insufficient documentation

## 2023-01-29 MED ORDER — LORAZEPAM 1 MG PO TABS
1.0000 mg | ORAL_TABLET | Freq: Every day | ORAL | 5 refills | Status: DC
Start: 2023-01-29 — End: 2023-05-23

## 2023-02-09 ENCOUNTER — Other Ambulatory Visit: Payer: Self-pay | Admitting: Family Medicine

## 2023-02-15 ENCOUNTER — Ambulatory Visit: Payer: Medicaid Other | Admitting: Professional Counselor

## 2023-02-15 DIAGNOSIS — F411 Generalized anxiety disorder: Secondary | ICD-10-CM

## 2023-02-15 NOTE — BH Specialist Note (Signed)
 Collaborative Care Initial Assessment  Session Start time: 4;00    Session End time: 5:00  Total time in minutes: 60   Type of Contact: Virtual Patient consent obtained:  Yes Types of Service: Collaborative care  Summary  Patient is a 48 yo female being referred to collaborative care by her pcp for anxiety and depression. Patient was engaged and cooperative during session.   Reason for referral in patient/family's own words:  My anxiety and depression  Patient's goal for today's visit: Not sure  History of Present illness:   History of panic attacks. Not sure what triggers it. Since October her anxiety had increased significantly because a new neighbor moved in. Chief complaint is stress and worry about her son who has some health problems and her neighbors. She has had bad experiences with neighbors. Reports being traumatized because she got bed bugs from neighbor and has had difficulty with thinking she is getting bit or that they are still there.  I feel like it got better because she's been taking lazarapam. Functions well going to work everyday being productive. Just works and comes home. Doesn't feel like doing much else. Loss mom in 2023 was a caretaker for her. Also was single mother and have had to deal with his medical issues. She feels like this has all piled up and she hasn't really processed it. Had to be strong. Takes ativan .   Please consider describing the following: any stressors including any medical health issues contributing to the stress (such as pain), how their mood has been impacting their functioning at work/home, and any support system they have in place.  Clinical Assessment   PHQ-9 Assessments:    02/15/2023    4:14 PM 12/15/2022    3:55 PM 10/27/2022    8:09 AM 08/01/2022    2:45 PM 05/30/2022    3:16 PM  Depression screen PHQ 2/9  Decreased Interest 1 0 0 0 0  Down, Depressed, Hopeless 1 0 1 0 0  PHQ - 2 Score 2 0 1 0 0  Altered sleeping 2  1 1     Tired, decreased energy 2  1 0   Change in appetite 1  0 0   Feeling bad or failure about yourself  0  0 0   Trouble concentrating 0  0 0   Moving slowly or fidgety/restless 0  0 0   Suicidal thoughts 0  0 0   PHQ-9 Score 7  3 1    Difficult doing work/chores Somewhat difficult  Somewhat difficult Somewhat difficult     GAD-7 Assessments:    02/15/2023    4:20 PM 10/27/2022    8:09 AM 08/01/2022    2:46 PM 05/30/2022    3:16 PM  GAD 7 : Generalized Anxiety Score  Nervous, Anxious, on Edge 2 2 1 1   Control/stop worrying 2 1 0 1  Worry too much - different things 1 1 1  0  Trouble relaxing 0 1 0 0  Restless 0 1 0 0  Easily annoyed or irritable 0 1 0 0  Afraid - awful might happen 0 1 0 0  Total GAD 7 Score 5 8 2 2   Anxiety Difficulty Somewhat difficult Somewhat difficult Somewhat difficult Not difficult at all     Social History:  Household: Lives with 32 yo son Marital status: single Number of Children: 2 Employment: Full time Education: Educated   Psychiatric Review of systems: Insomnia: No Changes in appetite: No Decreased need for sleep: No Family  history of bipolar disorder: No Hallucinations: No   Paranoia: No    Psychotropic medications: Current medications: Ativan , Buspar  Patient taking medications as prescribed:  Yes Side effects reported: No  Current medications (medication list) Current Outpatient Medications on File Prior to Visit  Medication Sig Dispense Refill   acyclovir  (ZOVIRAX ) 400 MG tablet TAKE 1 TABLET BY MOUTH TWICE A DAY 60 tablet 2   amLODipine  (NORVASC ) 10 MG tablet TAKE 1 TABLET BY MOUTH EVERY DAY 90 tablet 1   busPIRone  (BUSPAR ) 10 MG tablet TAKE 1 TABLET BY MOUTH THREE TIMES A DAY 270 tablet 2   chlorpheniramine  (CHLOR-TRIMETON ) 4 MG tablet Take one tablet once daily for excess drainage for 3 days, then one tablet once daily, as needed 14 tablet 0   fluticasone  (FLONASE ) 50 MCG/ACT nasal spray Place 2 sprays into both nostrils daily. 16 g 6    gabapentin  (NEURONTIN ) 100 MG capsule Take two capsules at bedtime for pain 60 capsule 5   levocetirizine (XYZAL ) 5 MG tablet TAKE 1 TABLET BY MOUTH EVERY DAY IN THE EVENING 30 tablet 5   Levonorgest-Eth Est & Eth Est 42-21-21-7 DAYS TABS TAKE 1 TABLET BY MOUTH EVERY DAY 91 tablet 1   LORazepam  (ATIVAN ) 1 MG tablet Take 1 tablet (1 mg total) by mouth at bedtime. 30 tablet 5   montelukast  (SINGULAIR ) 10 MG tablet TAKE 1 TABLET BY MOUTH EVERYDAY AT BEDTIME 90 tablet 1   pantoprazole  (PROTONIX ) 40 MG tablet TAKE 1 TABLET BY MOUTH DAILY BEFORE BREAKFAST 90 tablet 3   potassium chloride  (KLOR-CON ) 10 MEQ tablet Take 1 tablet (10 mEq total) by mouth 2 (two) times daily. 180 tablet 1   rosuvastatin  (CRESTOR ) 20 MG tablet TAKE 1 TABLET BY MOUTH EVERY DAY STOP PRAVASTATIN  90 tablet 3   triamterene -hydrochlorothiazide (MAXZIDE) 75-50 MG tablet TAKE 1 TABLET BY MOUTH EVERY DAY 90 tablet 2   No current facility-administered medications on file prior to visit.    Psychiatric History: Past psychiatry diagnosis:  Patient currently being seen by therapist/psychiatrist:   Prior Suicide Attempts:  Past psychiatry Hospitalization(s):  Past history of violence:   Traumatic Experiences: History or current traumatic events  no History or current physical trauma?  no History or current emotional trauma?  yes History or current sexual trauma?  no History or current domestic or intimate partner violence?  no PTSD symptoms if any traumatic experiences no  Alcohol and/or Substance Use History   Tobacco Alcohol Other substances  Current use None None None  Past use None None None  Past treatment      Withdrawal Potential:   Self-harm Behaviors Risk Assessment Self-harm risk factors:  Depression Patient endorses recent thoughts of harming self:  Denies   Guns in the home: No   Protective factors: I would never hurt myself  Danger to Others Risk Assessment Danger to others risk factors:   None Patient endorses recent thoughts of harming others: Denies  Consulting Civil Engineer discussed emergency crisis plan with client and provided local emergency services resources.  Mental status exam:   General Appearance Siegfried:  Neat Eye Contact:  Good Motor Behavior:  Normal Speech:  Normal Level of Consciousness:  Alert Mood:  Anxious Affect:  Appropriate Anxiety Level:  Minimal Thought Process:  Coherent Thought Content:  WNL Perception:  Normal Judgment:  Good Insight:  Present  Diagnosis:   Goals:  Learn coping skills to deal with stress, anxiety and built up emotions   Interventions: Mindfulness or Relaxation Training and CBT Cognitive  Behavioral Therapy

## 2023-02-19 NOTE — Patient Instructions (Signed)
 If your symptoms worsen or you have thoughts of suicide/homicide, PLEASE SEEK IMMEDIATE MEDICAL ATTENTION.  You may always call:   National Suicide Hotline: 988 or 539 667 3577 [place] Crisis Line: 458 569 7915 Crisis Recovery in [place]: [phone]     These are available 24 hours a day, 7 days a week.

## 2023-02-28 ENCOUNTER — Ambulatory Visit: Payer: Medicaid Other | Admitting: Urology

## 2023-02-28 ENCOUNTER — Other Ambulatory Visit: Payer: Self-pay | Admitting: Family Medicine

## 2023-02-28 ENCOUNTER — Telehealth (INDEPENDENT_AMBULATORY_CARE_PROVIDER_SITE_OTHER): Payer: Self-pay | Admitting: Professional Counselor

## 2023-02-28 DIAGNOSIS — F411 Generalized anxiety disorder: Secondary | ICD-10-CM

## 2023-02-28 NOTE — BH Specialist Note (Signed)
 Virtual Behavioral Health Treatment Plan Team Note  MRN: [id number] NAME: Catherine Allen  DATE: 03/01/23  Start time: Start Time: 0904 End time: Stop Time: 0915 Total time: Total Time in Minutes (Visit): 11  Total number of Virtual BH Treatment Team Plan encounters: 1/4  Treatment Team Attendees: Dr. Vivia Ewing and Esmond Harps  Collaborative Care Psychiatric Consultant Case Review    Assessment/Provisional Diagnosis Catherine Allen is a 48 y.o. year old female with history of Anxiety and depression. The patient is referred for anxiety and depression.  The patient is reporting severe anxiety symptoms, uses ativan, gabapentin and buspar daily.  Giving use of multiple controlled substance and the need for higher level of management, I recommend referral to psychiatry. Patient would not be an appropriate candidate for collaborative care.    Recommendation Refer to psychiatry for further management [facility] specialist to follow up.  Diagnoses:    ICD-10-CM   1. GAD (generalized anxiety disorder)  F41.1       Goals, Interventions and Follow-up Plan Goals: Learn coping skills to deal with stress, anxiety and built up emotions Interventions: Mindfulness or Relaxation Training CBT Cognitive Behavioral Therapy Medication Management Recommendations: Defer Follow-up Plan:  Refer to psychiatry for medication management.   History of the present illness Presenting Problem/Current Symptoms:  The patient is a 48 year old female with a history of depression, anxiety, and reported panic attacks who presented for a collaborative care assessment. Her primary concerns include significant stress and worry related to her son?Ts health problems and ongoing conflict with her neighbors. She reports frequent stomping in her apartment complex, which leaves her feeling irritable, on edge, and disrupts her sleep. Additionally, she experiences excessive worry about bed bugs. Last October, her anxiety worsened  after a neighbor brought bed bugs into the apartment complex. Although her home was treated and she hasn?Tt seen any recently, she continues to feel as though they are still present, which further disrupts her sleep and contributes to her anxiety.  The patient has a significant trauma history and describes a difficult life, having taken on many responsibilities. She lost her mother in 2023, for whom she was the primary caregiver, and she has been a single mother for most of her children?Ts lives, which added to her stress. Ongoing medical issues further compound her emotional burden. Despite these challenges, she is able to maintain employment and perform well at work but reports having little energy or motivation outside of work. She primarily stays home, isolating herself and avoiding social activities.  Currently, the patient is being treated with Lorazepam, which she has been on for an extended period. There may be some concern regarding potential dependence, which will be further evaluated. She denies any history of psychiatric hospitalizations, prior suicide attempts, current suicidal ideation, or thoughts of self-harm. She also denies any history of substance abuse.   Screenings PHQ-9 Assessments:     02/15/2023    4:14 PM 12/15/2022    3:55 PM 10/27/2022    8:09 AM  Depression screen PHQ 2/9  Decreased Interest 1 0 0  Down, Depressed, Hopeless 1 0 1  PHQ - 2 Score 2 0 1  Altered sleeping 2  1  Tired, decreased energy 2  1  Change in appetite 1  0  Feeling bad or failure about yourself  0  0  Trouble concentrating 0  0  Moving slowly or fidgety/restless 0  0  Suicidal thoughts 0  0  PHQ-9 Score 7  3  Difficult doing  work/chores Somewhat difficult  Somewhat difficult   GAD-7 Assessments:     02/15/2023    4:20 PM 10/27/2022    8:09 AM 08/01/2022    2:46 PM 05/30/2022    3:16 PM  GAD 7 : Generalized Anxiety Score  Nervous, Anxious, on Edge 2 2 1 1   Control/stop worrying 2 1 0 1   Worry too much - different things 1 1 1  0  Trouble relaxing 0 1 0 0  Restless 0 1 0 0  Easily annoyed or irritable 0 1 0 0  Afraid - awful might happen 0 1 0 0  Total GAD 7 Score 5 8 2 2   Anxiety Difficulty Somewhat difficult Somewhat difficult Somewhat difficult Not difficult at all    Past Medical History Past Medical History:  Diagnosis Date   Allergy    Anxiety    [employer] 07/26/2019   Dyspareunia    Hemorrhoids    Herpes    HLD (hyperlipidemia)    HTN (hypertension)    Hypertension    [employer] 07/26/2019   MRSA (methicillin resistant Staphylococcus aureus) 2010   boils thighs   S/P endoscopy 06/2010   Dr. Jena Gauss: mild erosive esophagitis, chronic duodenitis, mild gastritis, stop NSAIDs.    Vaginitis     Vital signs: There were no vitals filed for this visit.  Allergies:  Allergies as of 02/28/2023   (No Known Allergies)    Medication History Current medications:  Outpatient Encounter Medications as of 02/28/2023  Medication Sig   acyclovir (ZOVIRAX) 400 MG tablet TAKE 1 TABLET BY MOUTH TWICE A DAY   amLODipine (NORVASC) 10 MG tablet TAKE 1 TABLET BY MOUTH EVERY DAY   busPIRone (BUSPAR) 10 MG tablet TAKE 1 TABLET BY MOUTH THREE TIMES A DAY   chlorpheniramine (CHLOR-TRIMETON) 4 MG tablet Take one tablet once daily for excess drainage for 3 days, then one tablet once daily, as needed   fluticasone (FLONASE) 50 MCG/ACT nasal spray Place 2 sprays into both nostrils daily.   gabapentin (NEURONTIN) 100 MG capsule Take two capsules at bedtime for pain   levocetirizine (XYZAL) 5 MG tablet TAKE 1 TABLET BY MOUTH EVERY DAY IN THE EVENING   Levonorgest-Eth Est & Eth Est 42-21-21-7 DAYS TABS TAKE 1 TABLET BY MOUTH EVERY DAY   LORazepam (ATIVAN) 1 MG tablet Take 1 tablet (1 mg total) by mouth at bedtime.   montelukast (SINGULAIR) 10 MG tablet TAKE 1 TABLET BY MOUTH EVERYDAY AT BEDTIME   pantoprazole (PROTONIX) 40 MG tablet TAKE 1 TABLET BY MOUTH DAILY BEFORE BREAKFAST    potassium chloride (KLOR-CON) 10 MEQ tablet Take 1 tablet (10 mEq total) by mouth 2 (two) times daily.   rosuvastatin (CRESTOR) 20 MG tablet TAKE 1 TABLET BY MOUTH EVERY DAY STOP PRAVASTATIN   triamterene-hydrochlorothiazide (MAXZIDE) 75-50 MG tablet TAKE 1 TABLET BY MOUTH EVERY DAY   No facility-administered encounter medications on file as of 02/28/2023.     Scribe for Treatment Team: Reuel Boom

## 2023-03-01 ENCOUNTER — Ambulatory Visit: Payer: Medicaid Other | Admitting: Professional Counselor

## 2023-03-01 DIAGNOSIS — F411 Generalized anxiety disorder: Secondary | ICD-10-CM

## 2023-03-01 NOTE — Patient Instructions (Signed)
 Catherine Allen, [facility], [facility]  Address: [address] [place], [place] [address]  Phone: [phone]  Services: Individual/couples therapy, mental wellness and behavioral coaching Website: [employer] in [place], [place]

## 2023-03-01 NOTE — BH Specialist Note (Unsigned)
[facility] Virtual BH Telephone Follow-up  MRN: [id number] NAME: Catherine Allen Date: 03/01/23  Start time: Start Time: 0330 End time: Stop Time: 0400 Total time: Total Time in Minutes (Visit): 30 Call number: Visit Number: 3- Third Visit  Reason for call today:  The patient is a 48 year old female who presented for a collaborative care follow-up to discuss the psychiatric consultation recommendations and treatment plan. During the visit, it was determined that she requires a higher level of care due to the potential dependence on benzodiazepines. The behavioral counselor reviewed the recommendations with the patient, explaining the rationale behind the need for specialized medication management. The patient expressed understanding and agreed to proceed with the next steps. A referral to psychiatry is being placed to ensure appropriate medication oversight, along with a recommendation for a traditional therapist to support her ongoing mental health needs. With these referrals in place, we will sign off on this patient from the collaborative care program.   PHQ-9 Scores:     02/15/2023    4:14 PM 12/15/2022    3:55 PM 10/27/2022    8:09 AM 08/01/2022    2:45 PM 05/30/2022    3:16 PM  Depression screen PHQ 2/9  Decreased Interest 1 0 0 0 0  Down, Depressed, Hopeless 1 0 1 0 0  PHQ - 2 Score 2 0 1 0 0  Altered sleeping 2  1 1    Tired, decreased energy 2  1 0   Change in appetite 1  0 0   Feeling bad or failure about yourself  0  0 0   Trouble concentrating 0  0 0   Moving slowly or fidgety/restless 0  0 0   Suicidal thoughts 0  0 0   PHQ-9 Score 7  3 1    Difficult doing work/chores Somewhat difficult  Somewhat difficult Somewhat difficult    GAD-7 Scores:     02/15/2023    4:20 PM 10/27/2022    8:09 AM 08/01/2022    2:46 PM 05/30/2022    3:16 PM  GAD 7 : Generalized Anxiety Score  Nervous, Anxious, on Edge 2 2 1 1   Control/stop worrying 2 1 0 1  Worry too much - different things 1  1 1  0  Trouble relaxing 0 1 0 0  Restless 0 1 0 0  Easily annoyed or irritable 0 1 0 0  Afraid - awful might happen 0 1 0 0  Total GAD 7 Score 5 8 2 2   Anxiety Difficulty Somewhat difficult Somewhat difficult Somewhat difficult Not difficult at all      Current medications:  Outpatient Encounter Medications as of 03/01/2023  Medication Sig   acyclovir (ZOVIRAX) 400 MG tablet TAKE 1 TABLET BY MOUTH TWICE A DAY   amLODipine (NORVASC) 10 MG tablet TAKE 1 TABLET BY MOUTH EVERY DAY   busPIRone (BUSPAR) 10 MG tablet TAKE 1 TABLET BY MOUTH THREE TIMES A DAY   chlorpheniramine (CHLOR-TRIMETON) 4 MG tablet Take one tablet once daily for excess drainage for 3 days, then one tablet once daily, as needed   fluticasone (FLONASE) 50 MCG/ACT nasal spray Place 2 sprays into both nostrils daily.   gabapentin (NEURONTIN) 100 MG capsule Take two capsules at bedtime for pain   levocetirizine (XYZAL) 5 MG tablet TAKE 1 TABLET BY MOUTH EVERY DAY IN THE EVENING   Levonorgest-Eth Est & Eth Est 42-21-21-7 DAYS TABS TAKE 1 TABLET BY MOUTH EVERY DAY   LORazepam (ATIVAN) 1 MG tablet Take 1  tablet (1 mg total) by mouth at bedtime.   montelukast (SINGULAIR) 10 MG tablet TAKE 1 TABLET BY MOUTH EVERYDAY AT BEDTIME   pantoprazole (PROTONIX) 40 MG tablet TAKE 1 TABLET BY MOUTH DAILY BEFORE BREAKFAST   potassium chloride (KLOR-CON) 10 MEQ tablet Take 1 tablet (10 mEq total) by mouth 2 (two) times daily.   rosuvastatin (CRESTOR) 20 MG tablet TAKE 1 TABLET BY MOUTH EVERY DAY STOP PRAVASTATIN   triamterene-hydrochlorothiazide (MAXZIDE) 75-50 MG tablet TAKE 1 TABLET BY MOUTH EVERY DAY   No facility-administered encounter medications on file as of 03/01/2023.   Substance Use Assessment Patient recently consumed alcohol:    Alcohol Use Disorder Identification Test (AUDIT):     05/19/2022    8:07 AM 10/26/2022    2:41 PM  Alcohol Use Disorder Test (AUDIT)  1. How often do you have a drink containing alcohol? 0 1  3. How  often do you have six or more drinks on one occasion? 0 0    Goals, Interventions and Follow-up Plan Goals: Increase healthy adjustment to current life circumstances Interventions: Motivational Interviewing Follow-up Plan: Refer to Psychiatrist for Medication Management   Reuel Boom

## 2023-04-03 ENCOUNTER — Ambulatory Visit: Payer: Medicaid Other | Admitting: Urology

## 2023-05-08 ENCOUNTER — Ambulatory Visit: Admitting: Urology

## 2023-05-09 ENCOUNTER — Other Ambulatory Visit: Payer: Self-pay | Admitting: Family Medicine

## 2023-05-15 ENCOUNTER — Ambulatory Visit (INDEPENDENT_AMBULATORY_CARE_PROVIDER_SITE_OTHER): Payer: Medicaid Other | Admitting: Family Medicine

## 2023-05-15 ENCOUNTER — Other Ambulatory Visit: Payer: Self-pay | Admitting: Family Medicine

## 2023-05-15 VITALS — BP 120/84 | HR 104 | Resp 18 | Ht 63.0 in | Wt 178.1 lb

## 2023-05-15 DIAGNOSIS — K219 Gastro-esophageal reflux disease without esophagitis: Secondary | ICD-10-CM

## 2023-05-15 DIAGNOSIS — I10 Essential (primary) hypertension: Secondary | ICD-10-CM | POA: Diagnosis not present

## 2023-05-15 DIAGNOSIS — J209 Acute bronchitis, unspecified: Secondary | ICD-10-CM

## 2023-05-15 DIAGNOSIS — F411 Generalized anxiety disorder: Secondary | ICD-10-CM

## 2023-05-15 DIAGNOSIS — N3946 Mixed incontinence: Secondary | ICD-10-CM

## 2023-05-15 DIAGNOSIS — E782 Mixed hyperlipidemia: Secondary | ICD-10-CM

## 2023-05-15 DIAGNOSIS — E559 Vitamin D deficiency, unspecified: Secondary | ICD-10-CM

## 2023-05-15 DIAGNOSIS — E66811 Obesity, class 1: Secondary | ICD-10-CM

## 2023-05-15 MED ORDER — ESCITALOPRAM OXALATE 5 MG PO TABS: 5.0000 mg | ORAL_TABLET | Freq: Every day | ORAL | 5 refills | Status: DC

## 2023-05-15 NOTE — Patient Instructions (Addendum)
 Annual exam In 8 to 10 weeks , re evaluate anxiety at that visit  New additional med for anxiety  is lexapro 5 mg daily , continue buspar   and lorazepam  as before  Pls make 30 min/ day at least 5 days / week exercise commitment  Practice dep breathing and other strategies  to de escalate anxiety and stress and panic when they occur  Fasting lipid, cmp and EGFR, TSH, CBC and vit D 3 to 5 days before next appointment  Thanks for choosing [facility], we consider it a privelige to serve you.

## 2023-05-23 ENCOUNTER — Encounter: Payer: Self-pay | Admitting: Family Medicine

## 2023-05-23 MED ORDER — LORAZEPAM 1 MG PO TABS: 1.0000 mg | ORAL_TABLET | Freq: Every day | ORAL | 5 refills | Status: DC

## 2023-05-23 NOTE — Assessment & Plan Note (Signed)
 Controlled, no change in medication

## 2023-05-23 NOTE — Assessment & Plan Note (Signed)
 Hyperlipidemia:Low fat diet discussed and encouraged.   Lipid Panel  Lab Results  Component Value Date   CHOL 159 12/12/2022   HDL 46 12/12/2022   LDLCALC 97 12/12/2022   TRIG 82 12/12/2022   CHOLHDL 3.5 12/12/2022    Updated lab needed at/ before next visit.

## 2023-05-23 NOTE — Progress Notes (Signed)
 Catherine Allen     MRN: [id number]      DOB: [birth year]  Chief Complaint  Patient presents with   Hypertension    4.5 month follow up     HPI Catherine Allen is here for follow up and re-evaluation of chronic medical conditions, medication management and review of any available recent lab and radiology data.  Preventive health is updated, specifically  Cancer screening and Immunization.   Questions or concerns regarding consultations or procedures which the PT has had in the interim are  addressed. The PT denies any adverse reactions to current medications since the last visit.  C/o uncontrolled anxietryROS Denies recent fever or chills. Denies sinus pressure, nasal congestion, ear pain or sore throat. Denies chest congestion, productive cough or wheezing. Denies chest pains, palpitations and leg swelling Denies abdominal pain, nausea, vomiting,diarrhea or constipation.   Denies dysuria, frequency, hesitancy or incontinence. Denies joint pain, swelling and limitation in mobility. Denies headaches, seizures, numbness, or tingling. Denies skin break down or rash.   PE  BP 120/84   Pulse (!) 104   Resp 18   Ht 5\' 3"  (1.6 m)   Wt 178 lb 1.9 oz (80.8 kg)   SpO2 98%   BMI 31.55 kg/m   Patient alert and oriented and in no cardiopulmonary distress.  HEENT: No facial asymmetry, EOMI,     Neck supple .  Chest: Clear to auscultation bilaterally.  CVS: S1, S2 no murmurs, no S3.Regular rate.  ABD: Soft non tender.   Ext: No edema  MS: Adequate ROM spine, shoulders, hips and knees.  Skin: Intact, no ulcerations or rash noted.  Psych: Good eye contact, normal affect. Memory intact not anxious or depressed appearing.  CNS: CN 2-12 intact, power,  normal throughout.no focal deficits noted.   Assessment & Plan  GAD (generalized anxiety disorder) Uncontrolled add lexapro  5 mg daily continue buspar  and lorazepam  as before  Essential hypertension Controlled, no change in  medication DASH diet and commitment to daily physical activity for a minimum of 30 minutes discussed and encouraged, as a part of hypertension management. The importance of attaining a healthy weight is also discussed.     05/15/2023    4:11 PM 05/15/2023    3:41 PM 12/15/2022    3:47 PM 10/27/2022    8:08 AM 10/04/2022    1:07 PM 08/01/2022    3:13 PM 08/01/2022    2:44 PM  BP/Weight  Systolic BP 120 129 126 127 133 132 121  Diastolic BP 84 85 79 79 82 84 78  Wt. (Lbs)  178.12 176 173.04 174.2  175.04  BMI  31.55 kg/m2 31.18 kg/m2 30.65 kg/m2 30.86 kg/m2  30.05 kg/m2       Hyperlipemia Hyperlipidemia:Low fat diet discussed and encouraged.   Lipid Panel  Lab Results  Component Value Date   CHOL 159 12/12/2022   HDL 46 12/12/2022   LDLCALC 97 12/12/2022   TRIG 82 12/12/2022   CHOLHDL 3.5 12/12/2022    Updated lab needed at/ before next visit.   Vitamin D  deficiency Updated lab needed at/ before next visit.   Obesity (BMI 30.0-34.9)  Patient re-educated about  the importance of commitment to a  minimum of 150 minutes of exercise per week as able.  The importance of healthy food choices with portion control discussed, as well as eating regularly and within a 12 hour window most days. The need to choose "clean , green" food 50 to 75% of the time  is discussed, as well as to make water  the primary drink and set a goal of 64 ounces water  daily.       05/15/2023    3:41 PM 12/15/2022    3:47 PM 10/27/2022    8:08 AM  Weight /BMI  Weight 178 lb 1.9 oz 176 lb 173 lb 0.6 oz  Height 5\' 3"  (1.6 m) 5\' 3"  (1.6 m) 5\' 3"  (1.6 m)  BMI 31.55 kg/m2 31.18 kg/m2 30.65 kg/m2      GERD (gastroesophageal reflux disease) Controlled, no change in medication   Mixed stress and urge urinary incontinence Has upcoming appt with urology

## 2023-05-23 NOTE — Assessment & Plan Note (Signed)
  Patient re-educated about  the importance of commitment to a  minimum of 150 minutes of exercise per week as able.  The importance of healthy food choices with portion control discussed, as well as eating regularly and within a 12 hour window most days. The need to choose "clean , green" food 50 to 75% of the time is discussed, as well as to make water  the primary drink and set a goal of 64 ounces water  daily.       05/15/2023    3:41 PM 12/15/2022    3:47 PM 10/27/2022    8:08 AM  Weight /BMI  Weight 178 lb 1.9 oz 176 lb 173 lb 0.6 oz  Height 5\' 3"  (1.6 m) 5\' 3"  (1.6 m) 5\' 3"  (1.6 m)  BMI 31.55 kg/m2 31.18 kg/m2 30.65 kg/m2

## 2023-05-23 NOTE — Assessment & Plan Note (Signed)
 Uncontrolled add lexapro  5 mg daily continue buspar  and lorazepam  as before

## 2023-05-23 NOTE — Assessment & Plan Note (Signed)
 Updated lab needed at/ before next visit.

## 2023-05-23 NOTE — Assessment & Plan Note (Signed)
 Controlled, no change in medication DASH diet and commitment to daily physical activity for a minimum of 30 minutes discussed and encouraged, as a part of hypertension management. The importance of attaining a healthy weight is also discussed.     05/15/2023    4:11 PM 05/15/2023    3:41 PM 12/15/2022    3:47 PM 10/27/2022    8:08 AM 10/04/2022    1:07 PM 08/01/2022    3:13 PM 08/01/2022    2:44 PM  BP/Weight  Systolic BP 120 129 126 127 133 132 121  Diastolic BP 84 85 79 79 82 84 78  Wt. (Lbs)  178.12 176 173.04 174.2  175.04  BMI  31.55 kg/m2 31.18 kg/m2 30.65 kg/m2 30.86 kg/m2  30.05 kg/m2

## 2023-05-23 NOTE — Assessment & Plan Note (Signed)
Has upcoming appt with urology

## 2023-06-06 ENCOUNTER — Other Ambulatory Visit: Payer: Self-pay | Admitting: Family Medicine

## 2023-06-07 ENCOUNTER — Ambulatory Visit (INDEPENDENT_AMBULATORY_CARE_PROVIDER_SITE_OTHER): Admitting: Urology

## 2023-06-07 ENCOUNTER — Encounter: Payer: Self-pay | Admitting: Urology

## 2023-06-07 VITALS — BP 132/77 | HR 86 | Temp 99.0°F

## 2023-06-07 DIAGNOSIS — R351 Nocturia: Secondary | ICD-10-CM

## 2023-06-07 DIAGNOSIS — R350 Frequency of micturition: Secondary | ICD-10-CM

## 2023-06-07 DIAGNOSIS — N3946 Mixed incontinence: Secondary | ICD-10-CM

## 2023-06-07 DIAGNOSIS — N393 Stress incontinence (female) (male): Secondary | ICD-10-CM

## 2023-06-07 LAB — BLADDER SCAN AMB NON-IMAGING: Scan Result: 10

## 2023-06-07 NOTE — Progress Notes (Signed)
 Name: Catherine Allen DOB: [birth year] MRN: [id number]  History of Present Illness: Catherine Allen is a 48 y.o. female who presents today as a new patient at [facility] [place]. All available relevant medical records have been reviewed.  GU / GYN History includes: 1. HSV.  Today: She reports occasional stress incontinence with cough/laugh/sneeze for "years"; wears 1 pad per day on average "just in case". Denies urge incontinence. She reports urinary incontinence is not significantly bothersome.   She reports mild urinary frequency and nocturia x1 since starting hydrochlorothiazide for her blood pressure. Also drinks water  before bed and overnight. Denies urinary urgency or urge incontinence. She reports consuming 1-2 caffeinated beverages per day on average.  She denies prior attempted treatment for these symptoms; was prescribed Myrbetriq  and then Ditropan  at one point but never took either due to side effect concerns.  She denies dysuria, hesitancy, straining to void, or sensations of incomplete emptying.  She also reports concern about noticing urine "smell and color on panty liners" and on a used tampon one time. Her PCP was concerned about possible vesicovaginal fistula. Patient denies gross hematuria, pneumaturia, fecaluria, or recurrent UTI. She denies vaginal pain, bleeding, abnormal discharge, or vaginal flatus. She denies being established with a GYN provider; her PCP does her Pap smears and manages her contraception.    Medications: Current Outpatient Medications  Medication Sig Dispense Refill   acyclovir  (ZOVIRAX ) 400 MG tablet TAKE 1 TABLET BY MOUTH TWICE A DAY 60 tablet 2   amLODipine  (NORVASC ) 10 MG tablet TAKE 1 TABLET BY MOUTH EVERY DAY 90 tablet 1   busPIRone  (BUSPAR ) 10 MG tablet TAKE 1 TABLET BY MOUTH THREE TIMES A DAY 270 tablet 2   chlorpheniramine  (CHLOR-TRIMETON ) 4 MG tablet Take one tablet once daily for excess drainage for 3 days, then one tablet once  daily, as needed (Patient not taking: Reported on 06/07/2023) 14 tablet 0   escitalopram  (LEXAPRO ) 5 MG tablet TAKE 1 TABLET BY MOUTH EVERYDAY AT BEDTIME 90 tablet 2   fluticasone  (FLONASE ) 50 MCG/ACT nasal spray SPRAY 2 SPRAYS INTO EACH NOSTRIL EVERY DAY (Patient not taking: Reported on 06/07/2023) 16 mL 6   gabapentin  (NEURONTIN ) 100 MG capsule Take two capsules at bedtime for pain 60 capsule 5   levocetirizine (XYZAL ) 5 MG tablet TAKE 1 TABLET BY MOUTH EVERY DAY IN THE EVENING 30 tablet 5   Levonorgest-Eth Est & Eth Est 42-21-21-7 DAYS TABS TAKE 1 TABLET BY MOUTH EVERY DAY 91 tablet 1   LORazepam  (ATIVAN ) 1 MG tablet Take 1 tablet (1 mg total) by mouth at bedtime. 30 tablet 5   montelukast  (SINGULAIR ) 10 MG tablet TAKE 1 TABLET BY MOUTH EVERYDAY AT BEDTIME 90 tablet 1   pantoprazole  (PROTONIX ) 40 MG tablet TAKE 1 TABLET BY MOUTH DAILY BEFORE BREAKFAST 90 tablet 3   potassium chloride  (KLOR-CON ) 10 MEQ tablet TAKE 1 TABLET BY MOUTH 2 TIMES DAILY. 180 tablet 1   rosuvastatin  (CRESTOR ) 20 MG tablet TAKE 1 TABLET BY MOUTH EVERY DAY STOP PRAVASTATIN  90 tablet 3   triamterene -hydrochlorothiazide (MAXZIDE) 75-50 MG tablet TAKE 1 TABLET BY MOUTH EVERY DAY 90 tablet 2   No current facility-administered medications for this visit.    Allergies: No Known Allergies  Past Medical History:  Diagnosis Date   Allergy    Anxiety    Phreesia 07/26/2019   Dyspareunia    Hemorrhoids    Herpes    HLD (hyperlipidemia)    HTN (hypertension)    Hypertension  Phreesia 07/26/2019   MRSA (methicillin resistant Staphylococcus aureus) 2010   boils thighs   S/P endoscopy 06/2010   Dr. Riley Cheadle: mild erosive esophagitis, chronic duodenitis, mild gastritis, stop NSAIDs.    Vaginitis    Past Surgical History:  Procedure Laterality Date   CESAREAN SECTION  2004   CESAREAN SECTION N/A    Phreesia 07/26/2019   COLONOSCOPY WITH PROPOFOL  N/A 11/27/2019   Procedure: COLONOSCOPY WITH PROPOFOL ;  Surgeon: Suzette Espy, MD;  Location: [facility];  Service: Endoscopy;  Laterality: N/A;  9:00am   ESOPHAGOGASTRODUODENOSCOPY  2012   small hiatal hernia and mild erosive reflux esophagitis   Family History  Problem Relation Age of Onset   Hypertension Mother    Asthma Sister    Breast cancer Cousin    Colon cancer Neg Hx    Social History   Socioeconomic History   Marital status: Single    Spouse name: Not on file   Number of children: 2   Years of education: Not on file   Highest education level: Bachelor's degree (e.g., BA, AB, BS)  Occupational History   Occupation: homemaker  Tobacco Use   Smoking status: Never   Smokeless tobacco: Never  Substance and Sexual Activity   Alcohol use: No   Drug use: No   Sexual activity: Yes    Partners: Male    Birth control/protection: Pill  Other Topics Concern   Not on file  Social History Narrative   LIves w/ 2 sons 8 &16   Social Drivers of Health   Financial Resource Strain: Low Risk  (05/15/2023)   Overall Financial Resource Strain (CARDIA)    Difficulty of Paying Living Expenses: Not very hard  Food Insecurity: Food Insecurity Present (05/15/2023)   Hunger Vital Sign    Worried About Running Out of Food in the Last Year: Sometimes true    Ran Out of Food in the Last Year: Never true  Transportation Needs: No Transportation Needs (05/15/2023)   PRAPARE - Administrator, Civil Service (Medical): No    Lack of Transportation (Non-Medical): No  Physical Activity: Unknown (05/15/2023)   Exercise Vital Sign    Days of Exercise per Week: 0 days    Minutes of Exercise per Session: Not on file  Stress: Stress Concern Present (05/15/2023)   [employer] of Occupational Health - Occupational Stress Questionnaire    Feeling of Stress : Rather much  Social Connections: Socially Isolated (05/15/2023)   Social Connection and Isolation Panel [NHANES]    Frequency of Communication with Friends and Family: More than three times a week     Frequency of Social Gatherings with Friends and Family: Never    Attends Religious Services: Never    Database administrator or Organizations: No    Attends Engineer, structural: Not on file    Marital Status: Never married  Catering manager Violence: Not on file    SUBJECTIVE  Review of Systems Constitutional: Patient denies any unintentional weight loss or change in strength lntegumentary: Patient denies any rashes or pruritus Cardiovascular: Patient denies chest pain or syncope Respiratory: Patient denies shortness of breath Gastrointestinal: Patient denies constipation or diarrhea Musculoskeletal: Patient denies muscle cramps or weakness Neurologic: Patient denies convulsions or seizures Allergic/Immunologic: Patient denies recent allergic reaction(s) Hematologic/Lymphatic: Patient denies bleeding tendencies Endocrine: Patient denies heat/cold intolerance  GU: As per HPI.  OBJECTIVE Vitals:   06/07/23 1334  BP: 132/77  Pulse: 86  Temp: 99 F (  37.2 C)   There is no height or weight on file to calculate BMI.  Physical Examination Constitutional: No obvious distress; patient is non-toxic appearing  Cardiovascular: No visible lower extremity edema.  Respiratory: The patient does not have audible wheezing/stridor; respirations do not appear labored  Gastrointestinal: Abdomen non-distended Musculoskeletal: Normal ROM of UEs  Skin: No obvious rashes/open sores  Neurologic: CN 2-12 grossly intact Psychiatric: Answered questions appropriately with mildly anxious affect  Hematologic/Lymphatic/Immunologic: No obvious bruises or sites of spontaneous bleeding  Pelvic exam: offered; patient declined  UA: patient declined PVR: 10 ml  ASSESSMENT Stress incontinence of urine - Plan: Urinalysis, Routine w reflex microscopic, BLADDER SCAN AMB NON-IMAGING  Nocturia  Urinary frequency  No acute findings. We discussed very low suspicion for bladder/vaginal/colorectal  fistula. She was advised that the more likely cause for urine on her tampon that one time was vaginal urine trapping.   We discussed her mild infrequent stress urinary incontinence; not significantly bothersome per patient. Advised expectant management.  For nocturia we discussed minimizing fluid intake prior to bedtime and overnight. Also discussed minimizing / avoiding caffeine intake for urinary frequency in general.  Pt verbalized understanding and agreement. She elected to follow up on an as-needed basis. All questions were answered.   PLAN Advised the following: 1. Return if symptoms worsen or fail to improve.  Orders Placed This Encounter  Procedures   Urinalysis, Routine w reflex microscopic   BLADDER SCAN AMB NON-IMAGING    It has been explained that the patient is to follow regularly with their PCP in addition to all other providers involved in their care and to follow instructions provided by these respective offices. Patient advised to contact urology clinic if any urologic-pertaining questions, concerns, new symptoms or problems arise in the interim period.  Patient Instructions      Electronically signed by:  Lauretta Ponto, MSN, FNP-C, CUNP 06/07/2023 2:24 PM

## 2023-06-07 NOTE — Patient Instructions (Signed)
 Catherine Allen

## 2023-06-07 NOTE — Progress Notes (Signed)
 post void residual=10

## 2023-06-19 ENCOUNTER — Other Ambulatory Visit: Payer: Self-pay | Admitting: Family Medicine

## 2023-07-12 DIAGNOSIS — E782 Mixed hyperlipidemia: Secondary | ICD-10-CM | POA: Diagnosis not present

## 2023-07-12 DIAGNOSIS — I10 Essential (primary) hypertension: Secondary | ICD-10-CM | POA: Diagnosis not present

## 2023-07-12 DIAGNOSIS — E559 Vitamin D deficiency, unspecified: Secondary | ICD-10-CM | POA: Diagnosis not present

## 2023-07-13 LAB — CMP14+EGFR
ALT: 22 IU/L (ref 0–32)
AST: 22 IU/L (ref 0–40)
Albumin: 4.3 g/dL (ref 3.9–4.9)
Alkaline Phosphatase: 75 IU/L (ref 44–121)
BUN/Creatinine Ratio: 6 — ABNORMAL LOW (ref 9–23)
BUN: 6 mg/dL (ref 6–24)
Bilirubin Total: 0.5 mg/dL (ref 0.0–1.2)
CO2: 19 mmol/L — ABNORMAL LOW (ref 20–29)
Calcium: 9.1 mg/dL (ref 8.7–10.2)
Chloride: 102 mmol/L (ref 96–106)
Creatinine, Ser: 1.03 mg/dL — ABNORMAL HIGH (ref 0.57–1.00)
Globulin, Total: 2.4 g/dL (ref 1.5–4.5)
Glucose: 86 mg/dL (ref 70–99)
Potassium: 3.7 mmol/L (ref 3.5–5.2)
Sodium: 140 mmol/L (ref 134–144)
Total Protein: 6.7 g/dL (ref 6.0–8.5)
eGFR: 67 mL/min/1.73 (ref >59)

## 2023-07-13 LAB — LIPID PANEL
Chol/HDL Ratio: 3.5 ratio (ref 0.0–4.4)
Cholesterol, Total: 140 mg/dL (ref 100–199)
HDL: 40 mg/dL (ref >39)
LDL Chol Calc (NIH): 84 mg/dL (ref 0–99)
Triglycerides: 80 mg/dL (ref 0–149)
VLDL Cholesterol Cal: 16 mg/dL (ref 5–40)

## 2023-07-13 LAB — CBC WITH DIFFERENTIAL/PLATELET
Basophils Absolute: 0.1 x10E3/uL (ref 0.0–0.2)
Basos: 1 %
EOS (ABSOLUTE): 0.1 x10E3/uL (ref 0.0–0.4)
Eos: 1 %
Hematocrit: 45 % (ref 34.0–46.6)
Hemoglobin: 13.7 g/dL (ref 11.1–15.9)
Immature Grans (Abs): 0 x10E3/uL (ref 0.0–0.1)
Immature Granulocytes: 0 %
Lymphocytes Absolute: 3.6 x10E3/uL — ABNORMAL HIGH (ref 0.7–3.1)
Lymphs: 39 %
MCH: 26.9 pg (ref 26.6–33.0)
MCHC: 30.4 g/dL — ABNORMAL LOW (ref 31.5–35.7)
MCV: 88 fL (ref 79–97)
Monocytes Absolute: 0.6 x10E3/uL (ref 0.1–0.9)
Monocytes: 6 %
Neutrophils Absolute: 4.9 x10E3/uL (ref 1.4–7.0)
Neutrophils: 53 %
Platelets: 362 x10E3/uL (ref 150–450)
RBC: 5.09 x10E6/uL (ref 3.77–5.28)
RDW: 12.2 % (ref 11.7–15.4)
WBC: 9.3 x10E3/uL (ref 3.4–10.8)

## 2023-07-13 LAB — TSH: TSH: 2.25 u[IU]/mL (ref 0.450–4.500)

## 2023-07-13 LAB — VITAMIN D 25 HYDROXY (VIT D DEFICIENCY, FRACTURES): Vit D, 25-Hydroxy: 66 ng/mL (ref 30.0–100.0)

## 2023-07-18 ENCOUNTER — Ambulatory Visit (INDEPENDENT_AMBULATORY_CARE_PROVIDER_SITE_OTHER): Admitting: Family Medicine

## 2023-07-18 ENCOUNTER — Encounter: Payer: Self-pay | Admitting: Family Medicine

## 2023-07-18 VITALS — BP 126/79 | HR 98 | Resp 18 | Ht 62.0 in | Wt 176.0 lb

## 2023-07-18 DIAGNOSIS — E782 Mixed hyperlipidemia: Secondary | ICD-10-CM

## 2023-07-18 DIAGNOSIS — Z0000 Encounter for general adult medical examination without abnormal findings: Secondary | ICD-10-CM

## 2023-07-18 DIAGNOSIS — Z23 Encounter for immunization: Secondary | ICD-10-CM

## 2023-07-18 DIAGNOSIS — M25552 Pain in left hip: Secondary | ICD-10-CM | POA: Diagnosis not present

## 2023-07-18 DIAGNOSIS — I10 Essential (primary) hypertension: Secondary | ICD-10-CM | POA: Diagnosis not present

## 2023-07-18 MED ORDER — LORAZEPAM 1 MG PO TABS: 1.0000 mg | ORAL_TABLET | Freq: Every day | ORAL | 5 refills | Status: DC

## 2023-07-18 MED ORDER — LEVOCETIRIZINE DIHYDROCHLORIDE 5 MG PO TABS: 5.0000 mg | ORAL_TABLET | Freq: Every evening | ORAL | 5 refills | Status: DC

## 2023-07-18 MED ORDER — MELOXICAM 15 MG PO TABS: 15.0000 mg | ORAL_TABLET | Freq: Every day | ORAL | 0 refills | Status: DC

## 2023-07-18 NOTE — Patient Instructions (Addendum)
 F/U in later nov/ early December, call ikf you need me sooner   Hep B#1 today  Nurse visit for Hep B#2 in 2 months  Nurse visit for Hep B #3 in 6 months  Fasting lipid, cmp and EGFR 3 to 5 days before next visit  Meloxicam  is prescribed for  10 days  hip and upper thigh pain following recent trauma, if pain perisits I will refer you to Ortho, you will need to let me know  It is important that you exercise regularly at least 30 minutes 5 times a week. If you develop chest pain, have severe difficulty breathing, or feel very tired, stop exercising immediately and seek medical attention   Thanks for choosing [facility], we consider it a privelige to serve you.

## 2023-07-19 ENCOUNTER — Encounter: Payer: Self-pay | Admitting: Family Medicine

## 2023-07-19 DIAGNOSIS — M25552 Pain in left hip: Secondary | ICD-10-CM | POA: Insufficient documentation

## 2023-07-19 DIAGNOSIS — Z0000 Encounter for general adult medical examination without abnormal findings: Secondary | ICD-10-CM | POA: Insufficient documentation

## 2023-07-19 NOTE — Progress Notes (Signed)
    Catherine Allen     MRN: [id number]      DOB: Jan 30, 1975  Chief Complaint  Patient presents with   Annual Exam    Cpe    Hip Pain    Complains of left hip pain for last couple weeks, pain comes and goes     HPI: Patient is in for annual physical exam. 2 week h/o left hip pain after attempting to get up from the floor, pain is intermittent and triggered by certain movements Recent labs,  are reviewed. Immunization is reviewed , and  updated if needed.   PE: BP 126/79   Pulse 98   Resp 18   Ht 5' 2 (1.575 m)   Wt 176 lb 0.6 oz (79.9 kg)   SpO2 97%   BMI 32.20 kg/m   Pleasant  female, alert and oriented x 3, in no cardio-pulmonary distress. Afebrile. HEENT No facial trauma or asymetry. Sinuses non tender.  Extra occullar muscles intact.. External ears normal, . Neck: supple, no adenopathy,JVD or thyromegaly.No bruits.  Chest: Clear to ascultation bilaterally.No crackles or wheezes. Non tender to palpation  Breast: Asymptomatic, not examined, mammogram UTD Cardiovascular system; Heart sounds normal,  S1 and  S2 ,no S3.  No murmur, or thrill. Apical beat not displaced Peripheral pulses normal.  Abdomen: Soft, non tender, no organomegaly or masses. No bruits. Bowel sounds normal. No guarding, tenderness or rebound.   GU: Asymptomatic, not examined, pap is UTD  Musculoskeletal exam: Full ROM of spine, reduced in left hip ,  adequate in shoulders and knees. No deformity ,swelling or crepitus noted. No muscle wasting or atrophy.   Neurologic: Cranial nerves 2 to 12 intact. Power, tone ,sensation  normal throughout. No disturbance in gait. No tremor.  Skin: Intact, no ulceration, erythema , scaling or rash noted. Pigmentation normal throughout  Psych; Normal mood and affect. Judgement and concentration normal   Assessment & Plan:  Encounter for annual physical exam Annual exam as documented. Counseling done  re healthy lifestyle involving  commitment to 150 minutes exercise per week, heart healthy diet, and attaining healthy weight.The importance of adequate sleep also discussed. Regular seat belt use and home safety, is also discussed. Changes in health habits are decided on by the patient with goals and time frames  set for achieving them. Immunization and cancer screening needs are specifically addressed at this visit.   Immunization due After obtaining informed consent, the  Hep B #1 vaccine is  administered , with no adverse effect noted at the time of administration.   Acute pain of left hip Following acute injury, indirect trauma, meloxicam  prescribed for 10 days, if persists , will refer to ortho

## 2023-07-19 NOTE — Assessment & Plan Note (Signed)
 After obtaining informed consent, the Hep B #1 vaccine is  administered , with no adverse effect noted at the time of administration.

## 2023-07-19 NOTE — Assessment & Plan Note (Signed)
 Following acute injury, indirect trauma, meloxicam  prescribed for 10 days, if persists , will refer to ortho

## 2023-07-19 NOTE — Assessment & Plan Note (Signed)

## 2023-08-14 ENCOUNTER — Encounter: Payer: Self-pay | Admitting: Gastroenterology

## 2023-08-15 ENCOUNTER — Other Ambulatory Visit: Payer: Self-pay | Admitting: Family Medicine

## 2023-08-23 ENCOUNTER — Other Ambulatory Visit: Payer: Self-pay | Admitting: Family Medicine

## 2023-08-29 ENCOUNTER — Encounter: Payer: Self-pay | Admitting: Family Medicine

## 2023-08-29 ENCOUNTER — Other Ambulatory Visit: Payer: Self-pay | Admitting: Family Medicine

## 2023-08-30 ENCOUNTER — Other Ambulatory Visit: Payer: Self-pay

## 2023-08-30 MED ORDER — LEVONORGEST-ETH EST & ETH EST 42-21-21-7 DAYS PO TABS: 1.0000 | ORAL_TABLET | Freq: Every day | ORAL | 1 refills | Status: AC

## 2023-09-18 ENCOUNTER — Ambulatory Visit (INDEPENDENT_AMBULATORY_CARE_PROVIDER_SITE_OTHER)

## 2023-09-18 DIAGNOSIS — Z23 Encounter for immunization: Secondary | ICD-10-CM | POA: Diagnosis not present

## 2023-09-18 NOTE — Progress Notes (Signed)
 Patient is in office today for a nurse visit for Immunization hepatitis B . Patient Injection was given in the  Left deltoid. Patient tolerated injection well.

## 2023-10-01 ENCOUNTER — Ambulatory Visit
Admission: RE | Admit: 2023-10-01 | Discharge: 2023-10-01 | Disposition: A | Discharge status: Home / Self Care | Payer: Self-pay | Source: Ambulatory Visit | Attending: Nurse Practitioner | Admitting: Nurse Practitioner

## 2023-10-01 ENCOUNTER — Ambulatory Visit: Payer: Self-pay

## 2023-10-01 ENCOUNTER — Encounter: Payer: Self-pay | Admitting: Gastroenterology

## 2023-10-01 ENCOUNTER — Other Ambulatory Visit: Payer: Self-pay

## 2023-10-01 VITALS — BP 129/82 | HR 95 | Temp 98.7°F | Resp 20

## 2023-10-01 DIAGNOSIS — H6991 Unspecified Eustachian tube disorder, right ear: Secondary | ICD-10-CM

## 2023-10-01 MED ORDER — PREDNISONE 20 MG PO TABS
40.0000 mg | ORAL_TABLET | Freq: Every day | ORAL | 0 refills | Status: AC
Start: 2023-10-01 — End: 2023-10-06

## 2023-10-01 NOTE — Discharge Instructions (Signed)
 Take medication as prescribed. Continue your current allergy regimen. You may take over-the-counter Tylenol as needed for pain or discomfort. Apply warm compresses to the ear to help with pain or discomfort. Do not stick or insert anything inside of the ear while symptoms persist. As discussed, if symptoms fail to improve with this treatment, recommend follow-up with your primary care physician or ENT for further evaluation. Follow-up as needed.

## 2023-10-01 NOTE — ED Triage Notes (Addendum)
 Pt reports right ear sounds muffled intermittent headache since Saturday. Denies any known ear pain or injury. Reports has been taking otc medication for head congestion, tylenol and ibuprofen , herbal teas that gave relief of headache but reports head fullness remains.

## 2023-10-01 NOTE — ED Provider Notes (Signed)
 [facility]    CSN: [id number] Arrival date & time: 10/01/23  1146      History   Chief Complaint Chief Complaint  Patient presents with   Ear Fullness    Hearing in right ear has been muffled since Saturday - Entered by patient    HPI Catherine Allen is a 48 y.o. female.   The history is provided by the patient.   Patient presents with a several day history of right ear fullness, muffled hearing, and headache.  Patient states that symptoms started after she went to a football game the night prior.  She denies fever, chills, injury, trauma, ear drainage, nasal congestion, runny nose, or dizziness.  She states that she feels that the pressure is worse when she is on the downstairs floor of her apartment.  She states that she does take Xyzal , Singulair , and has been using Flonase .  She has also been using herbal teas and over-the-counter analgesics for her symptoms.  Past Medical History:  Diagnosis Date   Allergy    Anxiety    Phreesia 07/26/2019   Arthritis    Dyspareunia    GERD (gastroesophageal reflux disease)    Hemorrhoids    Herpes    HLD (hyperlipidemia)    HTN (hypertension)    Hypertension    Phreesia 07/26/2019   MRSA (methicillin resistant Staphylococcus aureus) 2010   boils thighs   S/P endoscopy 06/2010   Dr. Shaaron: mild erosive esophagitis, chronic duodenitis, mild gastritis, stop NSAIDs.    Vaginitis     Patient Active Problem List   Diagnosis Date Noted   Encounter for annual physical exam 07/19/2023   Acute pain of left hip 07/19/2023   Mixed stress and urge urinary incontinence 01/01/2023   Immunization due 10/29/2022   Vitamin D  deficiency 02/20/2022   Pigmented skin lesion 10/12/2021   IFG (impaired fasting glucose) 08/24/2020   Neck pain on right side 07/02/2020   GERD (gastroesophageal reflux disease) 04/07/2019   Cough 03/14/2019   Bunion of great toe of right foot 11/22/2017   Decreased vision in both eyes 06/08/2017    Panic attacks 11/22/2016   Breast calcifications on mammogram 09/11/2016   GAD (generalized anxiety disorder) 07/14/2015   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      Home Medications    Prior to Admission medications   Medication Sig Start Date End Date Taking? Authorizing Provider  predniSONE  (DELTASONE ) 20 MG tablet Take 2 tablets (40 mg total) by mouth daily with breakfast for 5 days. 10/01/23 10/06/23 Yes Leath-Warren, Etta PARAS, NP  amLODipine  (NORVASC ) 10 MG tablet TAKE 1 TABLET BY MOUTH EVERY DAY 08/23/23   Antonetta Rollene BRAVO, MD  busPIRone  (BUSPAR ) 10 MG tablet TAKE 1 TABLET BY MOUTH THREE TIMES A DAY 08/15/23   Antonetta Rollene BRAVO, MD  escitalopram  (LEXAPRO ) 5 MG tablet TAKE 1 TABLET BY MOUTH EVERYDAY AT BEDTIME  06/06/23   Antonetta Rollene BRAVO, MD  fluticasone  (FLONASE ) 50 MCG/ACT nasal spray SPRAY 2 SPRAYS INTO EACH NOSTRIL EVERY DAY 05/15/23   Antonetta Rollene BRAVO, MD  gabapentin  (NEURONTIN ) 100 MG capsule Take two capsules at bedtime for pain 02/15/22   Antonetta Rollene BRAVO, MD  levocetirizine (XYZAL ) 5 MG tablet Take 1 tablet (5 mg total) by mouth every evening. 07/18/23   Antonetta Rollene BRAVO, MD  Levonorgest-Eth Est & Eth Est 42-21-21-7 DAYS TABS Take 1 tablet by mouth daily. 08/30/23   Antonetta Rollene BRAVO, MD  LORazepam  (ATIVAN ) 1 MG tablet Take 1 tablet (1 mg total) by mouth at bedtime. 07/24/23   Antonetta Rollene BRAVO, MD   meloxicam  (MOBIC ) 15 MG tablet Take 1 tablet (15 mg total) by mouth daily. 07/18/23   Antonetta Rollene BRAVO, MD  montelukast  (SINGULAIR ) 10 MG tablet TAKE 1 TABLET BY MOUTH EVERYDAY AT BEDTIME 05/09/23   Antonetta Rollene BRAVO, MD  pantoprazole  (PROTONIX ) 40 MG tablet TAKE 1 TABLET BY MOUTH DAILY BEFORE BREAKFAST 12/11/22   Harper, Kristen S, PA-C  potassium chloride  (KLOR-CON ) 10 MEQ tablet TAKE 1 TABLET BY MOUTH 2 TIMES DAILY. 06/06/23   Antonetta Rollene BRAVO, MD  rosuvastatin  (CRESTOR ) 20 MG tablet TAKE 1 TABLET BY MOUTH EVERY DAY STOP PRAVASTATIN  12/11/22   Antonetta Rollene BRAVO, MD  triamterene -hydrochlorothiazide (MAXZIDE) 75-50 MG tablet TAKE 1 TABLET BY MOUTH EVERY DAY 06/19/23   Antonetta Rollene BRAVO, MD    Family History Family History  Problem Relation Age of Onset   Hypertension Mother    Anxiety disorder Mother    Asthma Sister    Breast cancer Cousin    Colon cancer Neg Hx     Social History Social History   Tobacco Use   Smoking status: Never   Smokeless tobacco: Never  Substance Use Topics   Alcohol use: No   Drug use: No     Allergies   Patient has no known allergies.   Review of Systems Review of Systems Per HPI  Physical Exam Triage Vital Signs ED Triage Vitals  Encounter Vitals Group     BP 10/01/23 1214 129/82     Girls Systolic BP Percentile --      Girls Diastolic BP Percentile --      Boys Systolic BP Percentile --      Boys Diastolic BP Percentile --      Pulse Rate 10/01/23 1214 95     Resp 10/01/23 1214 20     Temp 10/01/23 1214 98.7 F (37.1 C)     Temp Source 10/01/23 1214 Oral     SpO2 10/01/23 1214 98 %     Weight --      Height --      Head Circumference --      Peak Flow --      Pain Score 10/01/23 1212 0     Pain Loc --      Pain Education --      Exclude from Growth Chart --    No data found.  Updated Vital Signs BP 129/82 (BP Location: Right Arm)   Pulse 95   Temp 98.7 F (37.1 C) (Oral)   Resp 20   LMP 08/28/2023 (Approximate)  Comment: pt reports has period every 3 months due to [facility].  SpO2 98%   Visual Acuity Right Eye Distance:   Left Eye Distance:   Bilateral Distance:    Right Eye Near:   Left Eye Near:    Bilateral Near:  Physical Exam Vitals and nursing note reviewed.  Constitutional:      General: She is not in acute distress.    Appearance: Normal appearance.  HENT:     Head: Normocephalic.     Right Ear: Ear canal and external ear normal. Decreased hearing noted. A middle ear effusion is present.     Left Ear: Tympanic membrane, ear canal and external ear normal.     Nose: Nose normal.     Mouth/Throat:     Mouth: Mucous membranes are moist.  Eyes:     Extraocular Movements: Extraocular movements intact.     Conjunctiva/sclera: Conjunctivae normal.     Pupils: Pupils are equal, round, and reactive to light.  Cardiovascular:     Rate and Rhythm: Normal rate and regular rhythm.     Pulses: Normal pulses.     Heart sounds: Normal heart sounds.  Pulmonary:     Effort: Pulmonary effort is normal.     Breath sounds: Normal breath sounds.  Abdominal:     General: Bowel sounds are normal.     Palpations: Abdomen is soft.  Musculoskeletal:     Cervical back: Normal range of motion.  Neurological:     Mental Status: She is alert.  Psychiatric:        Mood and Affect: Mood normal.        Behavior: Behavior normal.      UC Treatments / Results  Labs (all labs ordered are listed, but only abnormal results are displayed) Labs Reviewed - No data to display  EKG   Radiology No results found.  Procedures Procedures (including critical care time)  Medications Ordered in UC Medications - No data to display  Initial Impression / Assessment and Plan / UC Course  I have reviewed the triage vital signs and the nursing notes.  Pertinent labs & imaging results that were available during my care of the patient were reviewed by me and considered in my medical decision making (see chart  for details).  On exam, patient has a right middle ear effusion.  There is no erythema of the right TM.  Symptoms consistent with right eustachian tube dysfunction.  She is already taking Xyzal , Singulair , and using Flonase .  Will start prednisone  40 mg for the next 5 days to help with eustachian tube dysfunction.  Supportive care recommendations were provided and discussed with the patient to include continuing her current allergy regimen, over-the-counter analgesics, and warm compresses to the ear.  Patient was advised regarding follow-up.  Patient was in agreement with this plan of care and verbalizes understanding.  All questions were answered.  Patient stable for discharge.   Final Clinical Impressions(s) / UC Diagnoses   Final diagnoses:  Acute dysfunction of right eustachian tube     Discharge Instructions      Take medication as prescribed. Continue your current allergy regimen. You may take over-the-counter Tylenol as needed for pain or discomfort. Apply warm compresses to the ear to help with pain or discomfort. Do not stick or insert anything inside of the ear while symptoms persist. As discussed, if symptoms fail to improve with this treatment, recommend follow-up with your primary care physician or ENT for further evaluation. Follow-up as needed.     ED Prescriptions     Medication Sig Dispense Auth. Provider   predniSONE  (DELTASONE ) 20 MG tablet Take 2 tablets (40 mg total) by mouth daily with breakfast for 5 days. 10 tablet Leath-Warren, Etta PARAS, NP  PDMP not reviewed this encounter.   Gilmer Etta PARAS, NP 10/01/23 1243

## 2023-10-03 ENCOUNTER — Encounter: Payer: Self-pay | Admitting: Family Medicine

## 2023-10-04 ENCOUNTER — Ambulatory Visit (INDEPENDENT_AMBULATORY_CARE_PROVIDER_SITE_OTHER): Admitting: Internal Medicine

## 2023-10-04 ENCOUNTER — Encounter: Payer: Self-pay | Admitting: Internal Medicine

## 2023-10-04 VITALS — BP 138/80 | HR 90 | Ht 63.0 in | Wt 183.0 lb

## 2023-10-04 DIAGNOSIS — J0111 Acute recurrent frontal sinusitis: Secondary | ICD-10-CM | POA: Insufficient documentation

## 2023-10-04 DIAGNOSIS — N760 Acute vaginitis: Secondary | ICD-10-CM | POA: Diagnosis not present

## 2023-10-04 MED ORDER — FLUCONAZOLE 150 MG PO TABS: 150.0000 mg | ORAL_TABLET | Freq: Once | ORAL | 0 refills | Status: AC

## 2023-10-04 MED ORDER — AMOXICILLIN-POT CLAVULANATE 875-125 MG PO TABS: 1.0000 | ORAL_TABLET | Freq: Two times a day (BID) | ORAL | 0 refills | Status: DC

## 2023-10-04 NOTE — Assessment & Plan Note (Signed)
 Her symptoms are likely due to acute sinusitis Ear fullness is also related to sinusitis Started empiric Augmentin  considering persistent symptoms despite symptomatic treatment Advised to perform warm water  gargling Flonase  for nasal congestion/allergies Continue Singulair  and Xyzal  for allergies

## 2023-10-04 NOTE — Progress Notes (Signed)
 Acute Office Visit  Subjective:    [name], female    DOB: 09/28/1975, 48 y.o.   MRN: [id number]  Chief Complaint  Patient presents with   Follow-up    Went to UC on Monday for ear fullness. Ears checked, no wax but they did mention fluid. Not getting any relief from antihistamine or nasal spray     HPI Patient is in today for complaint of right ear fullness for the last 1 week.  She went to urgent care on 10/01/23 for evaluation, was given oral prednisone .  She also reports stuffy nose, sinus pressure related headache and postnasal drip.  She has been taking Xyzal  and using Flonase  as well without much relief.  Denies any fever, chills, dyspnea or wheezing currently.  Denies ear discharge currently.  Past Medical History:  Diagnosis Date   Allergy    Anxiety    Phreesia 07/26/2019   Arthritis    Dyspareunia    GERD (gastroesophageal reflux disease)    Hemorrhoids    Herpes    HLD (hyperlipidemia)    HTN (hypertension)    Hypertension    Phreesia 07/26/2019   MRSA (methicillin resistant Staphylococcus aureus) 2010   boils thighs   S/P endoscopy 06/2010   Dr. Shaaron: mild erosive esophagitis, chronic duodenitis, mild gastritis, stop NSAIDs.    Vaginitis     Past Surgical History:  Procedure Laterality Date   CESAREAN SECTION  2004   CESAREAN SECTION N/A    Phreesia 07/26/2019   COLONOSCOPY WITH PROPOFOL  N/A 11/27/2019   Procedure: COLONOSCOPY WITH PROPOFOL ;  Surgeon: Shaaron Lamar HERO, MD;  [facility]: [facility];  Service: Endoscopy;  Laterality: N/A;  9:00am   ESOPHAGOGASTRODUODENOSCOPY  2012   small hiatal hernia and mild erosive reflux esophagitis    Family History  Problem Relation Age of Onset   Hypertension Mother    Anxiety disorder Mother    Asthma Sister    Breast cancer Cousin    Colon cancer Neg Hx     Social History   Socioeconomic History   Marital status: Single    Spouse name: Not on file   Number of children: 2   Years  of education: Not on file   Highest education level: Bachelor's degree (e.g., BA, AB, BS)  Occupational History   Occupation: homemaker  Tobacco Use   Smoking status: Never   Smokeless tobacco: Never  Substance and Sexual Activity   Alcohol use: No   Drug use: No   Sexual activity: Not Currently    Partners: Male    Birth control/protection: Pill  Other Topics Concern   Not on file  Social History Narrative   LIves w/ 2 sons 8 &16   Social Drivers of Health   Financial Resource Strain: Patient Declined (07/15/2023)   Overall Financial Resource Strain (CARDIA)    Difficulty of Paying Living Expenses: Patient declined  Food Insecurity: Unknown (07/15/2023)   Hunger Vital Sign    Worried About Running Out of Food in the Last Year: Patient declined    Ran Out of Food in the Last Year: Never true  Recent Concern: Food Insecurity - Food Insecurity Present (05/15/2023)   Hunger Vital Sign    Worried About Running Out of Food in the Last Year: Sometimes true    Ran Out of Food in the Last Year: Never true  Transportation Needs: No Transportation Needs (07/15/2023)   PRAPARE - Administrator, Civil Service (Medical):  No    Lack of Transportation (Non-Medical): No  Physical Activity: Inactive (07/15/2023)   Exercise Vital Sign    Days of Exercise per Week: 0 days    Minutes of Exercise per Session: Not on file  Stress: Stress Concern Present (07/15/2023)   [employer] of Occupational Health - Occupational Stress Questionnaire    Feeling of Stress: To some extent  Social Connections: Socially Isolated (07/15/2023)   Social Connection and Isolation Panel    Frequency of Communication with Friends and Family: More than three times a week    Frequency of Social Gatherings with Friends and Family: Patient declined    Attends Religious Services: Never    Database administrator or Organizations: No    Attends Engineer, structural: Not on file    Marital Status: Never  married  Catering manager Violence: Not on file    Outpatient Medications Prior to Visit  Medication Sig Dispense Refill   amLODipine  (NORVASC ) 10 MG tablet TAKE 1 TABLET BY MOUTH EVERY DAY 90 tablet 1   busPIRone  (BUSPAR ) 10 MG tablet TAKE 1 TABLET BY MOUTH THREE TIMES A DAY 90 tablet 8   escitalopram  (LEXAPRO ) 5 MG tablet TAKE 1 TABLET BY MOUTH EVERYDAY AT BEDTIME 90 tablet 2   fluticasone  (FLONASE ) 50 MCG/ACT nasal spray SPRAY 2 SPRAYS INTO EACH NOSTRIL EVERY DAY 16 mL 6   gabapentin  (NEURONTIN ) 100 MG capsule Take two capsules at bedtime for pain 60 capsule 5   levocetirizine (XYZAL ) 5 MG tablet Take 1 tablet (5 mg total) by mouth every evening. 30 tablet 5   Levonorgest-Eth Est & Eth Est 42-21-21-7 DAYS TABS Take 1 tablet by mouth daily. 91 tablet 1   LORazepam  (ATIVAN ) 1 MG tablet Take 1 tablet (1 mg total) by mouth at bedtime. 30 tablet 5   meloxicam  (MOBIC ) 15 MG tablet Take 1 tablet (15 mg total) by mouth daily. 10 tablet 0   montelukast  (SINGULAIR ) 10 MG tablet TAKE 1 TABLET BY MOUTH EVERYDAY AT BEDTIME 90 tablet 1   pantoprazole  (PROTONIX ) 40 MG tablet TAKE 1 TABLET BY MOUTH DAILY BEFORE BREAKFAST 90 tablet 3   potassium chloride  (KLOR-CON ) 10 MEQ tablet TAKE 1 TABLET BY MOUTH 2 TIMES DAILY. 180 tablet 1   predniSONE  (DELTASONE ) 20 MG tablet Take 2 tablets (40 mg total) by mouth daily with breakfast for 5 days. 10 tablet 0   rosuvastatin  (CRESTOR ) 20 MG tablet TAKE 1 TABLET BY MOUTH EVERY DAY STOP PRAVASTATIN  90 tablet 3   triamterene -hydrochlorothiazide (MAXZIDE) 75-50 MG tablet TAKE 1 TABLET BY MOUTH EVERY DAY 90 tablet 2   No facility-administered medications prior to visit.    No Known Allergies  Review of Systems  Constitutional:  Negative for chills and fever.  HENT:  Positive for congestion, postnasal drip and sinus pressure. Negative for sore throat.        Right ear fullness  Eyes:  Negative for pain and discharge.  Respiratory:  Negative for cough and shortness of  breath.   Cardiovascular:  Negative for chest pain and palpitations.  Gastrointestinal:  Negative for abdominal pain, diarrhea, nausea and vomiting.  Endocrine: Negative for polydipsia and polyuria.  Genitourinary:  Negative for dysuria and hematuria.  Musculoskeletal:  Negative for neck pain and neck stiffness.  Skin:  Negative for rash.  Neurological:  Negative for dizziness and weakness.  Psychiatric/Behavioral:  Negative for agitation and behavioral problems.        Objective:    Physical Exam Vitals  reviewed.  Constitutional:      General: She is not in acute distress.    Appearance: She is not diaphoretic.  HENT:     Head: Normocephalic and atraumatic.     Nose: Congestion present.     Right Sinus: Frontal sinus tenderness present.     Left Sinus: Frontal sinus tenderness present.     Mouth/Throat:     Mouth: Mucous membranes are moist.  Eyes:     General: No scleral icterus.    Extraocular Movements: Extraocular movements intact.  Cardiovascular:     Rate and Rhythm: Normal rate and regular rhythm.     Heart sounds: Normal heart sounds. No murmur heard. Pulmonary:     Breath sounds: Normal breath sounds. No wheezing or rales.  Musculoskeletal:     Cervical back: Neck supple. No tenderness.     Right lower leg: No edema.     Left lower leg: No edema.  Skin:    General: Skin is warm.     Findings: No rash.  Neurological:     General: No focal deficit present.     Mental Status: She is alert and oriented to person, place, and time.  Psychiatric:        Mood and Affect: Mood normal.        Behavior: Behavior normal.     BP 138/80   Pulse 90   Ht 5' 3 (1.6 m)   Wt 183 lb (83 kg)   LMP 08/28/2023 (Approximate) Comment: pt reports has period every 3 months due to [facility].  SpO2 99%   BMI 32.42 kg/m  Wt Readings from Last 3 Encounters:  10/04/23 183 lb (83 kg)  07/18/23 176 lb 0.6 oz (79.9 kg)  05/15/23 178 lb 1.9 oz (80.8 kg)        Assessment & Plan:    Problem List Items Addressed This Visit       Respiratory   Acute recurrent frontal sinusitis - Primary   Her symptoms are likely due to acute sinusitis Ear fullness is also related to sinusitis Started empiric Augmentin  considering persistent symptoms despite symptomatic treatment Advised to perform warm water  gargling Flonase  for nasal congestion/allergies Continue Singulair  and Xyzal  for allergies      Relevant Medications   amoxicillin -clavulanate (AUGMENTIN ) 875-125 MG tablet   fluconazole  (DIFLUCAN ) 150 MG tablet     Genitourinary   Acute vaginitis   Fluconazole  prescribed as per patient request for candidal vaginitis PPx, to be taken after antibiotic course      Relevant Medications   fluconazole  (DIFLUCAN ) 150 MG tablet     Meds ordered this encounter  Medications   amoxicillin -clavulanate (AUGMENTIN ) 875-125 MG tablet    Sig: Take 1 tablet by mouth 2 (two) times daily.    Dispense:  14 tablet    Refill:  0   fluconazole  (DIFLUCAN ) 150 MG tablet    Sig: Take 1 tablet (150 mg total) by mouth once for 1 dose.    Dispense:  1 tablet    Refill:  0     Londen Lorge MARLA Blanch, MD

## 2023-10-04 NOTE — Assessment & Plan Note (Signed)
 Fluconazole  prescribed as per patient request for candidal vaginitis PPx, to be taken after antibiotic course

## 2023-10-04 NOTE — Telephone Encounter (Signed)
 Scheduled

## 2023-10-04 NOTE — Patient Instructions (Signed)
 Please start taking Augmentin  as prescribed.  Please use Flonase  for nasal congestion.  Please perform warm water  gargling at least 3-4 times in a day.  Please use sinus inhaler and/or vaporizer as needed for nasal congestion.

## 2023-10-08 ENCOUNTER — Other Ambulatory Visit: Payer: Self-pay | Admitting: Internal Medicine

## 2023-10-08 ENCOUNTER — Encounter: Payer: Self-pay | Admitting: Internal Medicine

## 2023-10-08 DIAGNOSIS — J329 Chronic sinusitis, unspecified: Secondary | ICD-10-CM

## 2023-10-08 DIAGNOSIS — H938X3 Other specified disorders of ear, bilateral: Secondary | ICD-10-CM

## 2023-10-17 ENCOUNTER — Ambulatory Visit (INDEPENDENT_AMBULATORY_CARE_PROVIDER_SITE_OTHER)

## 2023-10-17 ENCOUNTER — Encounter (INDEPENDENT_AMBULATORY_CARE_PROVIDER_SITE_OTHER): Payer: Self-pay

## 2023-10-17 VITALS — BP 128/84 | HR 90 | Temp 97.2°F | Wt 182.4 lb

## 2023-10-17 DIAGNOSIS — R0981 Nasal congestion: Secondary | ICD-10-CM | POA: Diagnosis not present

## 2023-10-17 DIAGNOSIS — J310 Chronic rhinitis: Secondary | ICD-10-CM | POA: Diagnosis not present

## 2023-10-17 DIAGNOSIS — T485X5A Adverse effect of other anti-common-cold drugs, initial encounter: Secondary | ICD-10-CM | POA: Diagnosis not present

## 2023-10-17 DIAGNOSIS — J342 Deviated nasal septum: Secondary | ICD-10-CM

## 2023-10-17 DIAGNOSIS — J209 Acute bronchitis, unspecified: Secondary | ICD-10-CM

## 2023-10-17 DIAGNOSIS — J3489 Other specified disorders of nose and nasal sinuses: Secondary | ICD-10-CM | POA: Diagnosis not present

## 2023-10-17 MED ORDER — AZELASTINE HCL 0.1 % NA SOLN
1.0000 | Freq: Two times a day (BID) | NASAL | 2 refills | Status: AC
Start: 2023-10-17 — End: ?

## 2023-10-17 MED ORDER — FLUTICASONE PROPIONATE 50 MCG/ACT NA SUSP: 2.0000 | Freq: Every day | NASAL | 6 refills | Status: AC

## 2023-10-17 NOTE — Progress Notes (Signed)
 HPI:   Catherine Allen is a 48 y.o. female who presents as a new patient being seen in consultation for right ear discomfort. Patient states that last winter she felt she had fullness in her right ear. She was evaluated and had her ear cleaned but the fullness persisted. She states that she was prescribed Flonase , prednisone , and antibiotics and eventually she did notice relief of her ear symptoms. She does note however that she has nasal obstruction intermittently L>R and for this she has been using Afrin for over a year. She states that that is the only thing that provides her relief of nasal obstruction. She denies any current hearing difficulty or ear discomfort. She states that if she hold her nostril open at night she feels significant increase in airflow through her nose. She does take levocetirizine and singulair  daily for allergies. She states she has attempted to try astelin  in the past but she is very intolerant to the taste and it makes her vomit.    PMH/Meds/All/SocHx/FamHx/ROS: Past Medical History:  Diagnosis Date   Allergy    Anxiety    Phreesia 07/26/2019   Arthritis    Dyspareunia    GERD (gastroesophageal reflux disease)    Hemorrhoids    Herpes    HLD (hyperlipidemia)    HTN (hypertension)    Hypertension    Phreesia 07/26/2019   MRSA (methicillin resistant Staphylococcus aureus) 2010   boils thighs   S/P endoscopy 06/2010   Dr. Shaaron: mild erosive esophagitis, chronic duodenitis, mild gastritis, stop NSAIDs.    Vaginitis    Past Surgical History:  Procedure Laterality Date   CESAREAN SECTION  2004   CESAREAN SECTION N/A    Phreesia 07/26/2019   COLONOSCOPY WITH PROPOFOL  N/A 11/27/2019   Procedure: COLONOSCOPY WITH PROPOFOL ;  Surgeon: Shaaron Lamar HERO, MD;  Location: [facility];  Service: Endoscopy;  Laterality: N/A;  9:00am   ESOPHAGOGASTRODUODENOSCOPY  2012   small hiatal hernia and mild erosive reflux esophagitis   No family history of bleeding disorders,  wound healing problems or difficulty with anesthesia.  Social Connections: Socially Isolated (07/15/2023)   Social Connection and Isolation Panel    Frequency of Communication with Friends and Family: More than three times a week    Frequency of Social Gatherings with Friends and Family: Patient declined    Attends Religious Services: Never    Database administrator or Organizations: No    Attends Engineer, structural: Not on file    Marital Status: Never married    Current Outpatient Medications:    amLODipine  (NORVASC ) 10 MG tablet, TAKE 1 TABLET BY MOUTH EVERY DAY, Disp: 90 tablet, Rfl: 1   amoxicillin -clavulanate (AUGMENTIN ) 875-125 MG tablet, Take 1 tablet by mouth 2 (two) times daily., Disp: 14 tablet, Rfl: 0   busPIRone  (BUSPAR ) 10 MG tablet, TAKE 1 TABLET BY MOUTH THREE TIMES A DAY, Disp: 90 tablet, Rfl: 8   escitalopram  (LEXAPRO ) 5 MG tablet, TAKE 1 TABLET BY MOUTH EVERYDAY AT BEDTIME, Disp: 90 tablet, Rfl: 2   fluticasone  (FLONASE ) 50 MCG/ACT nasal spray, SPRAY 2 SPRAYS INTO EACH NOSTRIL EVERY DAY, Disp: 16 mL, Rfl: 6   gabapentin  (NEURONTIN ) 100 MG capsule, Take two capsules at bedtime for pain, Disp: 60 capsule, Rfl: 5   levocetirizine (XYZAL ) 5 MG tablet, Take 1 tablet (5 mg total) by mouth every evening., Disp: 30 tablet, Rfl: 5   Levonorgest-Eth Est & Eth Est 42-21-21-7 DAYS TABS, Take 1 tablet by mouth daily.,  Disp: 91 tablet, Rfl: 1   LORazepam  (ATIVAN ) 1 MG tablet, Take 1 tablet (1 mg total) by mouth at bedtime., Disp: 30 tablet, Rfl: 5   meloxicam  (MOBIC ) 15 MG tablet, Take 1 tablet (15 mg total) by mouth daily., Disp: 10 tablet, Rfl: 0   montelukast  (SINGULAIR ) 10 MG tablet, TAKE 1 TABLET BY MOUTH EVERYDAY AT BEDTIME, Disp: 90 tablet, Rfl: 1   pantoprazole  (PROTONIX ) 40 MG tablet, TAKE 1 TABLET BY MOUTH DAILY BEFORE BREAKFAST, Disp: 90 tablet, Rfl: 3   potassium chloride  (KLOR-CON ) 10 MEQ tablet, TAKE 1 TABLET BY MOUTH 2 TIMES DAILY., Disp: 180 tablet, Rfl: 1    rosuvastatin  (CRESTOR ) 20 MG tablet, TAKE 1 TABLET BY MOUTH EVERY DAY STOP PRAVASTATIN , Disp: 90 tablet, Rfl: 3   triamterene -hydrochlorothiazide (MAXZIDE) 75-50 MG tablet, TAKE 1 TABLET BY MOUTH EVERY DAY, Disp: 90 tablet, Rfl: 2 A complete ROS was performed with pertinent positives/negatives noted in the HPI. The remainder of the ROS are negative.   Physical Exam:  BP 128/84 (BP Location: Left Arm)   Pulse 90   Temp (!) 97.2 F (36.2 C)   Wt 182 lb 6.4 oz (82.7 kg)   LMP 08/28/2023 (Approximate) Comment: pt reports has period every 3 months due to [facility].  SpO2 96%   BMI 32.31 kg/m  General: Well developed, well nourished. No acute distress. Voice normal Head/Face: Normocephalic. No sinus tenderness. Facial nerve intact and equal bilaterally. No facial lacerations. Eyes: PERRL, no scleral icterus or conjunctival hemorrhage. EOMI. Ears: No gross deformity. Normal external canal. Tympanic membrane in tact bilaterally Hearing: Normal speech reception.  Nose: No gross deformity or lesions. No purulent discharge. Bilateral inferior turbinate hypertrophy. Left septal deviation with septal spur along the floor. Modified cottle maneuver performed with subsequent improvement in nasal obstruction Mouth/Oropharynx: Lips without any lesions. Dentition fair. No mucosal lesions within the oropharynx. No tonsillar enlargement, exudate, or lesions. Pharyngeal walls symmetrical. Uvula midline. Tongue midline without lesions. Larynx: See TFL if applicable Nasopharynx: See TFL if applicable Neck: Trachea midline. No masses. No thyromegaly or nodules palpated. No crepitus. Lymphatic: No lymphadenopathy in the neck. Respiratory: No stridor or distress. Room air. Cardiovascular: Regular rate and rhythm. Extremities: No edema or cyanosis. Warm and well-perfused. Skin: No scars or lesions on face or neck. Neurologic: CN II-XII grossly intact. Moving all extremities without gross abnormality. Other:  Independent  Review of Additional Tests or Records: None  Procedures: Bilateral Diagnostic Nasal Endoscopy  Pre-procedure diagnosis: Concern for nasal obstruction Post-procedure diagnosis: same Indication: See pre-procedure diagnosis and physical exam above Complications: None apparent Anesthesia: Lidocaine  4% and topical decongestant was topically sprayed in each nasal cavity  Description of Procedure:  Patient was identified. A flexible endoscope was utilized to evaluate the sinonasal cavities, mucosa, sinus ostia and turbinates and septum.  Overall, signs of mucosal inflammation are noted.  Also noted is a leftward septal deviation with a septal spur along the floor.  No mucopurulence, polyps, or masses noted.   Right Middle meatus: no purulence or polyps Right SE Recess: no purulence or polyps Left MM: limited visualization due to septal deviation, no purulence or polyps Left SE Recess: no purulence or polyps  CPT CODE -- 68768 - Mod 25  Impression & Plans: Catherine Allen is a 48 y.o. female with nasal obstruction. Patient has been using Afrin for 1 year for relief as nothing else works.   Nasal obstruction, rhinitis medicamentosa - stop Afrin use - start Flonase  daily, two sprays each nostril -  start astelin  twice daily, 1 spray each nostril - try nasal strips at night to help relief nasal valve collapse  Follow-up in 4-6 weeks  Adah Malkin, DO [facility]

## 2023-11-02 ENCOUNTER — Other Ambulatory Visit: Payer: Self-pay | Admitting: Family Medicine

## 2023-11-06 ENCOUNTER — Ambulatory Visit: Admitting: Gastroenterology

## 2023-11-12 ENCOUNTER — Telehealth: Payer: Self-pay | Admitting: *Deleted

## 2023-11-12 ENCOUNTER — Ambulatory Visit: Admitting: Gastroenterology

## 2023-11-12 ENCOUNTER — Encounter: Payer: Self-pay | Admitting: Gastroenterology

## 2023-11-12 VITALS — BP 124/82 | HR 102 | Temp 98.8°F | Ht 62.0 in | Wt 184.6 lb

## 2023-11-12 DIAGNOSIS — K219 Gastro-esophageal reflux disease without esophagitis: Secondary | ICD-10-CM

## 2023-11-12 DIAGNOSIS — K641 Second degree hemorrhoids: Secondary | ICD-10-CM

## 2023-11-12 DIAGNOSIS — K5900 Constipation, unspecified: Secondary | ICD-10-CM

## 2023-11-12 MED ORDER — TRULANCE 3 MG PO TABS: 3.0000 mg | ORAL_TABLET | Freq: Every day | ORAL | Status: DC

## 2023-11-12 NOTE — Telephone Encounter (Signed)
 Medication Samples have been provided to the patient.  Drug name: Trulance       Strength: 3mg         Qty: 3 boxes  LOT: [id number]  Exp.Date: 04/2024  Dosing instructions: Take 1 daily   The patient has been instructed regarding the correct time, dose, and frequency of taking this medication, including desired effects and most common side effects.   Adrien Charmaine Jean 3:05 PM 11/12/2023

## 2023-11-12 NOTE — Patient Instructions (Addendum)
 I am giving you some samples of Trulance today. You may continue the probiotic with this but do not take any stool softeners or MiraLAX with it. As with all of these constipation medications you could have a few days of looser stools with some urgency.   If you find by the end of the sample period that you like it, please let me know and I will send in a prescription.  If you find is not helpful or have not had a bowel movement in several days of taking the Trulance then resume using MiraLAX.  Continue pantoprazole  40 mg once daily.  Continue to avoid your trigger foods and following GERD diet.  If you notice you are having more frequent symptoms please let me know and we may need to consider change in therapy.  GERD diet:  Avoid fried, fatty, greasy, spicy, citrus foods. Avoid caffeine and carbonated beverages. Avoid chocolate. Try eating 4-6 small meals a day rather than 3 large meals. Do not eat within 3 hours of laying down. Prop head of bed up on wood or bricks to create a 6 inch incline.  Continue Anusol and hemorrhoid cream as needed for hemorrhoids.  Look over the [facility] banding pamphlet provided to you and if you decide you would like a potential permanent/temporary fix then that is an option.  Follow up in 1 year, sooner if needed pending constipation management.   It was a pleasure to see you today. I want to create trusting relationships with patients. If you receive a survey regarding your visit,  I greatly appreciate you taking time to fill this out on paper or through your MyChart. I value your feedback.  Charmaine Melia, MSN, FNP-BC, AGACNP-BC [facility]

## 2023-11-12 NOTE — Progress Notes (Signed)
 GI Office Note    Referring Provider: Antonetta Rollene BRAVO, MD Primary Care Physician:  Antonetta Rollene BRAVO, MD Primary Gastroenterologist: Lamar HERO.Rourk, MD  Date:  11/12/2023  ID:  Catherine Allen, DOB 10/27/1975, MRN [id number]   Chief Complaint   Chief Complaint  Patient presents with   Follow-up   History of Present Illness  Catherine Allen is a 48 y.o. female with a history of GERD, dysphagia, constipation, anxiety, hemorrhoids, HTN. HLD, and arthiritis presenting today with complaint of GERD and constipation.   Colonoscopy November 2021: - non bleeding internal hemorrhoids - The examination was otherwise normal on direct and retroflexion views. Mildly redundant colon.  - No specimens collected. - Repeat in 10 years.   OV March 2022: GERD well controlled on protonix  40 mg daily. No dysphagia, abdominal pain, nausea, vomiting, brbpr, melena, constipation, diarrhea, unintentional weight loss. Advised to continue pantoprazole  40 mg daily.   Last visit with me 09/29/2021. GERD fairly well controlled. Chronic constipation, tried metamucil and benefiber but increased Bm's cause more issues with hemorrhoids. Using creams and suppositories for her hemorrhoids. Advised to continue pantoprazole  40 mg once daily. Miralax 1 capful every 2-3 days recommended. Famotidine  20 mg as needed.   Last office visit 10/04/22 with Josette Centers, PA-C.reflux fairly well controlled on pantoprazole  40 mg once daily. Advised to continue pantoprazole  40 mg daily. Tums or pepcid  recommended for breakthrough. BM every 3 days on miralax as needed - daily causing loose stools. Recommended adding benefiber.   Today:  Discussed the use of AI scribe software for clinical note transcription with the patient, who gave verbal consent to proceed.  She experiences constipation with daily bowel movements that are often unsatisfactory. She uses Miralax twice a week and occasionally takes a digestive probiotic.  Various treatments, including Metamucil and Citrucel, have been tried, but some powders cause discomfort. Dulcolax leads to watery stools, so it is avoided unless necessary. Bowel movements are sometimes loose and sometimes normal, causing frustration with inconsistency.  She has a history of hemorrhoids that become irritated and bleed, especially with loose stools. Hemorrhoid cream is used to manage swelling and irritation. During bowel movements, she sometimes feels hemorrhoid tissue protruding externally, causing discomfort.  She experiences acid reflux, generally well-controlled with pantoprazole  taken in the morning. Certain foods, particularly hamburger meat, exacerbate symptoms, causing discomfort and a sensation of fullness. Alka-Seltzer and Zantac  provide relief during infrequent episodes. Previously, pantoprazole  was required twice daily but has been reduced to once daily as symptoms have improved.      Wt Readings from Last 6 Encounters:  11/12/23 184 lb 9.6 oz (83.7 kg)  10/17/23 182 lb 6.4 oz (82.7 kg)  10/04/23 183 lb (83 kg)  07/18/23 176 lb 0.6 oz (79.9 kg)  05/15/23 178 lb 1.9 oz (80.8 kg)  12/15/22 176 lb (79.8 kg)    Body mass index is 33.76 kg/m.   Current Outpatient Medications  Medication Sig Dispense Refill   amLODipine  (NORVASC ) 10 MG tablet TAKE 1 TABLET BY MOUTH EVERY DAY 90 tablet 1   azelastine  (ASTELIN ) 0.1 % nasal spray Place 1 spray into both nostrils 2 (two) times daily. Use in each nostril as directed 30 mL 2   busPIRone  (BUSPAR ) 10 MG tablet TAKE 1 TABLET BY MOUTH THREE TIMES A DAY 90 tablet 8   escitalopram  (LEXAPRO ) 5 MG tablet TAKE 1 TABLET BY MOUTH EVERYDAY AT BEDTIME 90 tablet 2   fluticasone  (FLONASE ) 50 MCG/ACT nasal spray Place 2 sprays into  both nostrils daily. 16 mL 6   gabapentin  (NEURONTIN ) 100 MG capsule Take two capsules at bedtime for pain 60 capsule 5   levocetirizine (XYZAL ) 5 MG tablet Take 1 tablet (5 mg total) by mouth every evening.  30 tablet 5   Levonorgest-Eth Est & Eth Est 42-21-21-7 DAYS TABS Take 1 tablet by mouth daily. 91 tablet 1   LORazepam  (ATIVAN ) 1 MG tablet Take 1 tablet (1 mg total) by mouth at bedtime. 30 tablet 5   montelukast  (SINGULAIR ) 10 MG tablet TAKE 1 TABLET BY MOUTH EVERYDAY AT BEDTIME 90 tablet 1   pantoprazole  (PROTONIX ) 40 MG tablet TAKE 1 TABLET BY MOUTH DAILY BEFORE BREAKFAST 90 tablet 3   potassium chloride  (KLOR-CON ) 10 MEQ tablet TAKE 1 TABLET BY MOUTH 2 TIMES DAILY. 180 tablet 1   rosuvastatin  (CRESTOR ) 20 MG tablet TAKE 1 TABLET BY MOUTH EVERY DAY STOP PRAVASTATIN  90 tablet 3   triamterene -hydrochlorothiazide (MAXZIDE) 75-50 MG tablet TAKE 1 TABLET BY MOUTH EVERY DAY 90 tablet 2   No current facility-administered medications for this visit.    Past Medical History:  Diagnosis Date   Allergy    Anxiety    Phreesia 07/26/2019   Arthritis    Dyspareunia    GERD (gastroesophageal reflux disease)    Hemorrhoids    Herpes    HLD (hyperlipidemia)    HTN (hypertension)    Hypertension    Phreesia 07/26/2019   MRSA (methicillin resistant Staphylococcus aureus) 2010   boils thighs   S/P endoscopy 06/2010   Dr. Shaaron: mild erosive esophagitis, chronic duodenitis, mild gastritis, stop NSAIDs.    Vaginitis     Past Surgical History:  Procedure Laterality Date   CESAREAN SECTION  2004   CESAREAN SECTION N/A    Phreesia 07/26/2019   COLONOSCOPY WITH PROPOFOL  N/A 11/27/2019   Procedure: COLONOSCOPY WITH PROPOFOL ;  Surgeon: Shaaron Lamar HERO, MD;  [facility];  Service: Endoscopy;  Laterality: N/A;  9:00am   ESOPHAGOGASTRODUODENOSCOPY  2012   small hiatal hernia and mild erosive reflux esophagitis    Family History  Problem Relation Age of Onset   Hypertension Mother    Anxiety disorder Mother    Asthma Sister    Breast cancer Cousin    Colon cancer Neg Hx     Allergies as of 11/12/2023   (No Known Allergies)    Social History   Socioeconomic History    Marital status: Single    Spouse name: Not on file   Number of children: 2   Years of education: Not on file   Highest education level: Bachelor's degree (e.g., BA, AB, BS)  Occupational History   Occupation: homemaker  Tobacco Use   Smoking status: Never   Smokeless tobacco: Never  Substance and Sexual Activity   Alcohol use: No   Drug use: No   Sexual activity: Not Currently    Partners: Male    Birth control/protection: Pill  Other Topics Concern   Not on file  Social History Narrative   LIves w/ 2 sons 8 &16   Social Drivers of Health   Financial Resource Strain: Patient Declined (07/15/2023)   Overall Financial Resource Strain (CARDIA)    Difficulty of Paying Living Expenses: Patient declined  Food Insecurity: Unknown (07/15/2023)   Hunger Vital Sign    Worried About Running Out of Food in the Last Year: Patient declined    Ran Out of Food in the Last Year: Never true  Recent Concern: Food Insecurity -  Food Insecurity Present (05/15/2023)   Hunger Vital Sign    Worried About Running Out of Food in the Last Year: Sometimes true    Ran Out of Food in the Last Year: Never true  Transportation Needs: No Transportation Needs (07/15/2023)   PRAPARE - Administrator, Civil Service (Medical): No    Lack of Transportation (Non-Medical): No  Physical Activity: Inactive (07/15/2023)   Exercise Vital Sign    Days of Exercise per Week: 0 days    Minutes of Exercise per Session: Not on file  Stress: Stress Concern Present (07/15/2023)   [employer] of Occupational Health - Occupational Stress Questionnaire    Feeling of Stress: To some extent  Social Connections: Socially Isolated (07/15/2023)   Social Connection and Isolation Panel    Frequency of Communication with Friends and Family: More than three times a week    Frequency of Social Gatherings with Friends and Family: Patient declined    Attends Religious Services: Never    Database Administrator or Organizations: No     Attends Engineer, Structural: Not on file    Marital Status: Never married    Review of Systems   Gen: Denies fever, chills, anorexia. Denies fatigue, weakness, weight loss.  CV: Denies chest pain, palpitations, syncope, peripheral edema, and claudication. Resp: Denies dyspnea at rest, cough, wheezing, coughing up blood, and pleurisy. GI: See HPI Psych: + anxiety. Denies depression, memory loss, confusion. No homicidal or suicidal ideation.  Heme: + rectal bleeding. Denies bruising and enlarged lymph nodes.  Physical Exam   BP 124/82 (BP Location: Right Arm, Patient Position: Sitting, Cuff Size: Normal)   Pulse (!) 102   Temp 98.8 F (37.1 C) (Oral)   Ht 5' 2 (1.575 m)   Wt 184 lb 9.6 oz (83.7 kg)   LMP  (LMP Unknown)   SpO2 98%   BMI 33.76 kg/m   General:   Alert and oriented. No distress noted. Pleasant and cooperative.  Head:  Normocephalic and atraumatic. Eyes:  Conjuctiva clear without scleral icterus. Mouth:  Oral mucosa pink and moist. Good dentition. No lesions. Abdomen:  +BS, soft, non-tender and non-distended. No rebound or guarding. No HSM or masses noted. Rectal: deferred Msk:  Symmetrical without gross deformities. Normal posture. Extremities:  Without edema. Neurologic:  Alert and  oriented x4 Psych:  Alert and cooperative. Normal mood and affect.  Assessment & Plan  Ceonna D Jabbour is a 48 y.o. female presenting today for follow up on constipation, hemorrhoids, and reflux.     Chronic constipation Incomplete bowel movements. Current regimen includes Miralax and a digestive probiotic. Occasional use of Dulcolax leads to watery stools. Considering prescription options like Amitiza or Trulance for more consistent bowel movements. Linzess deemed likely to be too strong. Trulance may provide normal stool texture without urgency, advised on washout period.  - Continue Miralax 17g as needed. - Try Trulance 3 mg once daily, with the option to adjust to  every other day if needed. Samples provided - advised to call with progress report.  - Avoid Dulcolax and other laxatives while trying Trulance, may continue probiotic.  - If no bowel movements occur, resume previous regimen.  Symptomatic internal hemorrhoids Occasional external protrusion and bleeding, especially with loose stools. Current management includes hemorrhoid cream for swelling. Colonoscopy in 2021 with internal hemorrhoids present with intermittent bleeding and pain issues at that time as well. Discussed banding procedure as a less invasive alternative to surgery. Potential for  recurrence if constipation persists, potential complications discussed.  - Provided pamphlet on hemorrhoid banding. - Consider hemorrhoid banding if symptoms persist or worsen. - Continue hemorrhoid suppositories and creams as needed.   Gastroesophageal reflux disease Generally well-controlled with pantoprazole  40 mg ocne daily. Recent episode likely triggered by fatty meal Hortense Edison). Symptoms managed with Alka-Seltzer and Zantac  as needed for breakthrough relief. No recent severe symptoms or acid regurgitation. - Continue pantoprazole  40 mg daily. - Use Alka-Seltzer or Zantac  as needed for breakthrough symptoms. - Monitor for increased frequency of symptoms and adjust treatment if necessary.      Follow up   Follow up recall for 1 year, sooner if worsening constipation.     Charmaine Melia, MSN, FNP-BC, AGACNP-BC [facility]

## 2023-11-14 DIAGNOSIS — H25093 Other age-related incipient cataract, bilateral: Secondary | ICD-10-CM | POA: Diagnosis not present

## 2023-11-14 DIAGNOSIS — H40003 Preglaucoma, unspecified, bilateral: Secondary | ICD-10-CM | POA: Diagnosis not present

## 2023-11-14 DIAGNOSIS — I10 Essential (primary) hypertension: Secondary | ICD-10-CM | POA: Diagnosis not present

## 2023-11-14 DIAGNOSIS — H5213 Myopia, bilateral: Secondary | ICD-10-CM | POA: Diagnosis not present

## 2023-11-14 DIAGNOSIS — D3132 Benign neoplasm of left choroid: Secondary | ICD-10-CM | POA: Diagnosis not present

## 2023-11-23 ENCOUNTER — Encounter (INDEPENDENT_AMBULATORY_CARE_PROVIDER_SITE_OTHER): Payer: Self-pay

## 2023-11-23 ENCOUNTER — Ambulatory Visit (INDEPENDENT_AMBULATORY_CARE_PROVIDER_SITE_OTHER)

## 2023-11-23 VITALS — BP 127/83 | HR 89 | Temp 98.0°F | Wt 184.0 lb

## 2023-11-23 DIAGNOSIS — Z09 Encounter for follow-up examination after completed treatment for conditions other than malignant neoplasm: Secondary | ICD-10-CM

## 2023-11-23 DIAGNOSIS — J3489 Other specified disorders of nose and nasal sinuses: Secondary | ICD-10-CM

## 2023-11-23 DIAGNOSIS — T485X5A Adverse effect of other anti-common-cold drugs, initial encounter: Secondary | ICD-10-CM

## 2023-11-23 NOTE — Progress Notes (Signed)
 HPI:  10/17/2023  Catherine Allen is a 48 y.o. female who presents as a new patient being seen in consultation for right ear discomfort. Patient states that last winter she felt she had fullness in her right ear. She was evaluated and had her ear cleaned but the fullness persisted. She states that she was prescribed Flonase , prednisone , and antibiotics and eventually she did notice relief of her ear symptoms. She does note however that she has nasal obstruction intermittently L>R and for this she has been using Afrin for over a year. She states that that is the only thing that provides her relief of nasal obstruction. She denies any current hearing difficulty or ear discomfort. She states that if she hold her nostril open at night she feels significant increase in airflow through her nose. She does take levocetirizine and singulair  daily for allergies. She states she has attempted to try astelin  in the past but she is very intolerant to the taste and it makes her vomit.   Today (11/23/2023) Patient presents today in follow-up regarding nasal congestion. Patient states all her symptoms are resolved. No complaints at this time.   PMH/Meds/All/SocHx/FamHx/ROS: Past Medical History:  Diagnosis Date   Allergy    Anxiety    Phreesia 07/26/2019   Arthritis    Dyspareunia    GERD (gastroesophageal reflux disease)    Hemorrhoids    Herpes    HLD (hyperlipidemia)    HTN (hypertension)    Hypertension    Phreesia 07/26/2019   MRSA (methicillin resistant Staphylococcus aureus) 2010   boils thighs   S/P endoscopy 06/2010   Dr. Shaaron: mild erosive esophagitis, chronic duodenitis, mild gastritis, stop NSAIDs.    Vaginitis    Past Surgical History:  Procedure Laterality Date   CESAREAN SECTION  2004   CESAREAN SECTION N/A    Phreesia 07/26/2019   COLONOSCOPY WITH PROPOFOL  N/A 11/27/2019   Procedure: COLONOSCOPY WITH PROPOFOL ;  Surgeon: Shaaron Lamar HERO, MD;  [facility]: [facility];  Service:  Endoscopy;  Laterality: N/A;  9:00am   ESOPHAGOGASTRODUODENOSCOPY  2012   small hiatal hernia and mild erosive reflux esophagitis   No family history of bleeding disorders, wound healing problems or difficulty with anesthesia.  Social Connections: Socially Isolated (07/15/2023)   Social Connection and Isolation Panel    Frequency of Communication with Friends and Family: More than three times a week    Frequency of Social Gatherings with Friends and Family: Patient declined    Attends Religious Services: Never    Database Administrator or Organizations: No    Attends Engineer, Structural: Not on file    Marital Status: Never married    Current Outpatient Medications:    amLODipine  (NORVASC ) 10 MG tablet, TAKE 1 TABLET BY MOUTH EVERY DAY, Disp: 90 tablet, Rfl: 1   azelastine  (ASTELIN ) 0.1 % nasal spray, Place 1 spray into both nostrils 2 (two) times daily. Use in each nostril as directed, Disp: 30 mL, Rfl: 2   busPIRone  (BUSPAR ) 10 MG tablet, TAKE 1 TABLET BY MOUTH THREE TIMES A DAY, Disp: 90 tablet, Rfl: 8   escitalopram  (LEXAPRO ) 5 MG tablet, TAKE 1 TABLET BY MOUTH EVERYDAY AT BEDTIME, Disp: 90 tablet, Rfl: 2   fluticasone  (FLONASE ) 50 MCG/ACT nasal spray, Place 2 sprays into both nostrils daily., Disp: 16 mL, Rfl: 6   gabapentin  (NEURONTIN ) 100 MG capsule, Take two capsules at bedtime for pain, Disp: 60 capsule, Rfl: 5   levocetirizine (XYZAL ) 5 MG tablet,  Take 1 tablet (5 mg total) by mouth every evening., Disp: 30 tablet, Rfl: 5   Levonorgest-Eth Est & Eth Est 42-21-21-7 DAYS TABS, Take 1 tablet by mouth daily., Disp: 91 tablet, Rfl: 1   LORazepam  (ATIVAN ) 1 MG tablet, Take 1 tablet (1 mg total) by mouth at bedtime., Disp: 30 tablet, Rfl: 5   montelukast  (SINGULAIR ) 10 MG tablet, TAKE 1 TABLET BY MOUTH EVERYDAY AT BEDTIME, Disp: 90 tablet, Rfl: 1   pantoprazole  (PROTONIX ) 40 MG tablet, TAKE 1 TABLET BY MOUTH DAILY BEFORE BREAKFAST, Disp: 90 tablet, Rfl: 3   Plecanatide  (TRULANCE) 3 MG TABS, Take 1 tablet (3 mg total) by mouth daily., Disp: , Rfl:    potassium chloride  (KLOR-CON ) 10 MEQ tablet, TAKE 1 TABLET BY MOUTH 2 TIMES DAILY., Disp: 180 tablet, Rfl: 1   rosuvastatin  (CRESTOR ) 20 MG tablet, TAKE 1 TABLET BY MOUTH EVERY DAY STOP PRAVASTATIN , Disp: 90 tablet, Rfl: 3   triamterene -hydrochlorothiazide (MAXZIDE) 75-50 MG tablet, TAKE 1 TABLET BY MOUTH EVERY DAY, Disp: 90 tablet, Rfl: 2 A complete ROS was performed with pertinent positives/negatives noted in the HPI. The remainder of the ROS are negative.   Physical Exam:  Wt 184 lb (83.5 kg)   LMP  (LMP Unknown)   BMI 33.65 kg/m  General: Well developed, well nourished. No acute distress. Voice normal Head/Face: Normocephalic. No sinus tenderness. Facial nerve intact and equal bilaterally. No facial lacerations. Eyes: PERRL, no scleral icterus or conjunctival hemorrhage. EOMI. Ears: No gross deformity. Normal external canal. Tympanic membrane in tact bilaterally Hearing: Normal speech reception.  Nose: No gross deformity or lesions. No purulent discharge. Improvement in bilateral inferior turbinate hypertrophy. Left septal deviation with septal spur along the floor.  Mouth/Oropharynx: Lips without any lesions. Dentition fair. No mucosal lesions within the oropharynx. No tonsillar enlargement, exudate, or lesions. Pharyngeal walls symmetrical. Uvula midline. Tongue midline without lesions. Larynx: See TFL if applicable Nasopharynx: See TFL if applicable Neck: Trachea midline. No masses. No thyromegaly or nodules palpated. No crepitus. Lymphatic: No lymphadenopathy in the neck. Respiratory: No stridor or distress. Room air. Cardiovascular: Regular rate and rhythm. Extremities: No edema or cyanosis. Warm and well-perfused. Skin: No scars or lesions on face or neck. Neurologic: CN II-XII grossly intact. Moving all extremities without gross abnormality. Other:  Independent Review of Additional Tests or  Records: None  Procedures: None  Impression & Plans: Catherine Allen is a 48 y.o. female with nasal obstruction. Patient had been using Afrin and was weaned off Afrin since last visit. All symptoms have resolved at this time.   Nasal obstruction, rhinitis medicamentosa - resolved - stop Afrin use - continue Flonase  daily, two sprays each nostril - continue astelin  twice daily, 1 spray each nostril  Follow-up as needed  Wane Mollett, DO [facility]

## 2023-11-27 ENCOUNTER — Other Ambulatory Visit: Payer: Self-pay | Admitting: Family Medicine

## 2023-11-29 ENCOUNTER — Other Ambulatory Visit: Payer: Self-pay | Admitting: Family Medicine

## 2023-12-10 ENCOUNTER — Other Ambulatory Visit: Payer: Self-pay | Admitting: Gastroenterology

## 2023-12-10 DIAGNOSIS — E782 Mixed hyperlipidemia: Secondary | ICD-10-CM | POA: Diagnosis not present

## 2023-12-10 DIAGNOSIS — K219 Gastro-esophageal reflux disease without esophagitis: Secondary | ICD-10-CM

## 2023-12-10 DIAGNOSIS — I10 Essential (primary) hypertension: Secondary | ICD-10-CM | POA: Diagnosis not present

## 2023-12-11 LAB — LIPID PANEL
Chol/HDL Ratio: 3.2 ratio (ref 0.0–4.4)
Cholesterol, Total: 150 mg/dL (ref 100–199)
HDL: 47 mg/dL (ref >39)
LDL Chol Calc (NIH): 88 mg/dL (ref 0–99)
Triglycerides: 79 mg/dL (ref 0–149)
VLDL Cholesterol Cal: 15 mg/dL (ref 5–40)

## 2023-12-11 LAB — CMP14+EGFR
ALT: 34 IU/L — ABNORMAL HIGH (ref 0–32)
AST: 28 IU/L (ref 0–40)
Albumin: 4 g/dL (ref 3.9–4.9)
Alkaline Phosphatase: 66 IU/L (ref 41–116)
BUN/Creatinine Ratio: 11 (ref 9–23)
BUN: 11 mg/dL (ref 6–24)
Bilirubin Total: 0.6 mg/dL (ref 0.0–1.2)
CO2: 22 mmol/L (ref 20–29)
Calcium: 8.7 mg/dL (ref 8.7–10.2)
Chloride: 100 mmol/L (ref 96–106)
Creatinine, Ser: 1.02 mg/dL — ABNORMAL HIGH (ref 0.57–1.00)
Globulin, Total: 2.7 g/dL (ref 1.5–4.5)
Glucose: 85 mg/dL (ref 70–99)
Potassium: 3.4 mmol/L — ABNORMAL LOW (ref 3.5–5.2)
Sodium: 137 mmol/L (ref 134–144)
Total Protein: 6.7 g/dL (ref 6.0–8.5)
eGFR: 67 mL/min/1.73 (ref >59)

## 2023-12-12 ENCOUNTER — Encounter: Payer: Self-pay | Admitting: Family Medicine

## 2023-12-12 ENCOUNTER — Ambulatory Visit (INDEPENDENT_AMBULATORY_CARE_PROVIDER_SITE_OTHER): Admitting: Family Medicine

## 2023-12-12 VITALS — BP 130/84 | HR 96 | Ht 63.0 in | Wt 185.1 lb

## 2023-12-12 DIAGNOSIS — E782 Mixed hyperlipidemia: Secondary | ICD-10-CM | POA: Diagnosis not present

## 2023-12-12 DIAGNOSIS — K219 Gastro-esophageal reflux disease without esophagitis: Secondary | ICD-10-CM

## 2023-12-12 DIAGNOSIS — Z23 Encounter for immunization: Secondary | ICD-10-CM

## 2023-12-12 DIAGNOSIS — F410 Panic disorder [episodic paroxysmal anxiety] without agoraphobia: Secondary | ICD-10-CM

## 2023-12-12 DIAGNOSIS — F411 Generalized anxiety disorder: Secondary | ICD-10-CM

## 2023-12-12 DIAGNOSIS — I10 Essential (primary) hypertension: Secondary | ICD-10-CM | POA: Diagnosis not present

## 2023-12-12 NOTE — Patient Instructions (Addendum)
 FU in 16 to 18 weeks  Flu vaccine today  Please schedule mammogram at checkout  Increase vegetable and fruit fresh or frozen to 60 % of food intake please   Good that you are drinking water   Work on sleep and exercise please  Thanks for choosing [facility], we consider it a privelige to serve you.

## 2023-12-14 ENCOUNTER — Other Ambulatory Visit (HOSPITAL_COMMUNITY): Payer: Self-pay | Admitting: Family Medicine

## 2023-12-14 DIAGNOSIS — Z1231 Encounter for screening mammogram for malignant neoplasm of breast: Secondary | ICD-10-CM

## 2023-12-17 ENCOUNTER — Encounter: Payer: Self-pay | Admitting: Family Medicine

## 2023-12-17 MED ORDER — LORAZEPAM 1 MG PO TABS: 1.0000 mg | ORAL_TABLET | Freq: Every day | ORAL | 5 refills | Status: AC

## 2023-12-17 NOTE — Assessment & Plan Note (Signed)
 Controlled, no change in medication

## 2023-12-17 NOTE — Assessment & Plan Note (Signed)
 Controlled, no change in medication DASH diet and commitment to daily physical activity for a minimum of 30 minutes discussed and encouraged, as a part of hypertension management. The importance of attaining a healthy weight is also discussed.     12/12/2023    4:14 PM 12/12/2023    3:49 PM 11/23/2023    2:49 PM 11/12/2023    1:33 PM 11/12/2023    1:30 PM 10/17/2023    2:02 PM 10/04/2023    9:44 AM  BP/Weight  Systolic BP 130 155 127 124 143 128 138  Diastolic BP 84 91 83 82 84 84 80  Wt. (Lbs)  185.08 184  184.6 182.4   BMI  32.79 kg/m2 33.65 kg/m2  33.76 kg/m2 32.31 kg/m2

## 2023-12-17 NOTE — Assessment & Plan Note (Signed)
 Hyperlipidemia:Low fat diet discussed and encouraged.   Lipid Panel  Lab Results  Component Value Date   CHOL 150 12/10/2023   HDL 47 12/10/2023   LDLCALC 88 12/10/2023   TRIG 79 12/10/2023   CHOLHDL 3.2 12/10/2023     Controlled, no change in medication

## 2023-12-17 NOTE — Assessment & Plan Note (Signed)
 After obtaining informed consent, the influenza vaccine is  administered , with no adverse effect noted at the time of administration.

## 2023-12-17 NOTE — Progress Notes (Signed)
   Catherine Allen     MRN: [id number]      DOB: July 16, 1975  Chief Complaint  Patient presents with   Medical Management of Chronic Issues    Follow up   Flu Vaccine    HPI Catherine Allen is here for follow up and re-evaluation of chronic medical conditions, medication management and review of any available recent lab and radiology data.  Preventive health is updated, specifically  Cancer screening and Immunization.   Questions or concerns regarding consultations or procedures which the PT has had in the interim are  addressed. The PT denies any adverse reactions to current medications since the last visit.  There are no new concerns.  There are no specific complaints   ROS Denies recent fever or chills. Denies sinus pressure, nasal congestion, ear pain or sore throat. Denies chest congestion, productive cough or wheezing. Denies chest pains, palpitations and leg swelling Denies abdominal pain, nausea, vomiting,diarrhea or constipation.   Denies dysuria, frequency, hesitancy or incontinence. Denies joint pain, swelling and limitation in mobility. Denies headaches, seizures, numbness, or tingling. Denies depression, anxiety or insomnia. Denies skin break down or rash.   PE  BP 130/84   Pulse 96   Ht 5' 3 (1.6 m)   Wt 185 lb 1.3 oz (84 kg)   LMP  (LMP Unknown)   SpO2 98%   BMI 32.79 kg/m   Patient alert and oriented and in no cardiopulmonary distress.  HEENT: No facial asymmetry, EOMI,     Neck supple .  Chest: Clear to auscultation bilaterally.  CVS: S1, S2 no murmurs, no S3.Regular rate.  ABD: Soft non tender.   Ext: No edema  MS: Adequate ROM spine, shoulders, hips and knees.  Skin: Intact, no ulcerations or rash noted.  Psych: Good eye contact, normal affect. Memory intact not anxious or depressed appearing.  CNS: CN 2-12 intact, power,  normal throughout.no focal deficits noted.   Assessment & Plan  Essential hypertension Controlled, no change in  medication DASH diet and commitment to daily physical activity for a minimum of 30 minutes discussed and encouraged, as a part of hypertension management. The importance of attaining a healthy weight is also discussed.     12/12/2023    4:14 PM 12/12/2023    3:49 PM 11/23/2023    2:49 PM 11/12/2023    1:33 PM 11/12/2023    1:30 PM 10/17/2023    2:02 PM 10/04/2023    9:44 AM  BP/Weight  Systolic BP 130 155 127 124 143 128 138  Diastolic BP 84 91 83 82 84 84 80  Wt. (Lbs)  185.08 184  184.6 182.4   BMI  32.79 kg/m2 33.65 kg/m2  33.76 kg/m2 32.31 kg/m2        GAD (generalized anxiety disorder) Controlled, no change in medication   GERD (gastroesophageal reflux disease) Controlled, no change in medication   Hyperlipemia Hyperlipidemia:Low fat diet discussed and encouraged.   Lipid Panel  Lab Results  Component Value Date   CHOL 150 12/10/2023   HDL 47 12/10/2023   LDLCALC 88 12/10/2023   TRIG 79 12/10/2023   CHOLHDL 3.2 12/10/2023     Controlled, no change in medication   Immunization due After obtaining informed consent, the influenza  vaccine is  administered , with no adverse effect noted at the time of administration.   Panic attacks Controlled, no change in medication

## 2024-01-27 ENCOUNTER — Other Ambulatory Visit: Payer: Self-pay | Admitting: Family Medicine

## 2024-02-04 ENCOUNTER — Inpatient Hospital Stay (HOSPITAL_COMMUNITY): Admission: RE | Admit: 2024-02-04 | Source: Ambulatory Visit

## 2024-02-08 ENCOUNTER — Other Ambulatory Visit: Payer: Self-pay | Admitting: Family Medicine

## 2024-02-11 ENCOUNTER — Ambulatory Visit (HOSPITAL_COMMUNITY)

## 2024-02-18 ENCOUNTER — Ambulatory Visit (HOSPITAL_COMMUNITY)

## 2024-04-09 ENCOUNTER — Ambulatory Visit: Admitting: Family Medicine
